# Patient Record
Sex: Female | Born: 1970 | Race: White | Hispanic: No | Smoking: Former smoker
Health system: Southern US, Community
[De-identification: ages and names within clinical notes are randomized; demographics above are authoritative.]

## PROBLEM LIST (undated history)

## (undated) DIAGNOSIS — Z87442 Personal history of urinary calculi: Secondary | ICD-10-CM

## (undated) DIAGNOSIS — E079 Disorder of thyroid, unspecified: Secondary | ICD-10-CM

## (undated) DIAGNOSIS — E7800 Pure hypercholesterolemia, unspecified: Secondary | ICD-10-CM

## (undated) DIAGNOSIS — I639 Cerebral infarction, unspecified: Secondary | ICD-10-CM

## (undated) DIAGNOSIS — Z8669 Personal history of other diseases of the nervous system and sense organs: Secondary | ICD-10-CM

## (undated) DIAGNOSIS — E039 Hypothyroidism, unspecified: Secondary | ICD-10-CM

## (undated) DIAGNOSIS — Z83719 Family history of colon polyps, unspecified: Secondary | ICD-10-CM

## (undated) DIAGNOSIS — D649 Anemia, unspecified: Secondary | ICD-10-CM

## (undated) DIAGNOSIS — Z9289 Personal history of other medical treatment: Secondary | ICD-10-CM

## (undated) DIAGNOSIS — Z860100 Personal history of colon polyps, unspecified: Secondary | ICD-10-CM

## (undated) DIAGNOSIS — E559 Vitamin D deficiency, unspecified: Secondary | ICD-10-CM

## (undated) DIAGNOSIS — Z8673 Personal history of transient ischemic attack (TIA), and cerebral infarction without residual deficits: Secondary | ICD-10-CM

## (undated) DIAGNOSIS — E119 Type 2 diabetes mellitus without complications: Secondary | ICD-10-CM

## (undated) DIAGNOSIS — G8110 Spastic hemiplegia affecting unspecified side: Secondary | ICD-10-CM

## (undated) DIAGNOSIS — R569 Unspecified convulsions: Secondary | ICD-10-CM

## (undated) DIAGNOSIS — Z8371 Family history of colonic polyps: Secondary | ICD-10-CM

## (undated) DIAGNOSIS — D0512 Intraductal carcinoma in situ of left breast: Secondary | ICD-10-CM

## (undated) DIAGNOSIS — F32A Depression, unspecified: Secondary | ICD-10-CM

## (undated) DIAGNOSIS — R4701 Aphasia: Secondary | ICD-10-CM

## (undated) DIAGNOSIS — R011 Cardiac murmur, unspecified: Secondary | ICD-10-CM

## (undated) DIAGNOSIS — L720 Epidermal cyst: Secondary | ICD-10-CM

## (undated) DIAGNOSIS — C801 Malignant (primary) neoplasm, unspecified: Secondary | ICD-10-CM

## (undated) DIAGNOSIS — Z923 Personal history of irradiation: Secondary | ICD-10-CM

## (undated) HISTORY — DX: Personal history of colonic polyps: Z86.010

## (undated) HISTORY — DX: Cardiac murmur, unspecified: R01.1

## (undated) HISTORY — DX: Personal history of transient ischemic attack (TIA), and cerebral infarction without residual deficits: Z86.73

## (undated) HISTORY — DX: Family history of colonic polyps: Z83.71

## (undated) HISTORY — DX: Personal history of other diseases of the nervous system and sense organs: Z86.69

## (undated) HISTORY — DX: Family history of colon polyps, unspecified: Z83.719

## (undated) HISTORY — DX: Disorder of thyroid, unspecified: E07.9

## (undated) HISTORY — DX: Personal history of colon polyps, unspecified: Z86.0100

## (undated) HISTORY — DX: Cerebral infarction, unspecified: I63.9

## (undated) HISTORY — PX: TONSILLECTOMY: SUR1361

## (undated) HISTORY — DX: Personal history of other medical treatment: Z92.89

## (undated) HISTORY — PX: BRAIN SURGERY: SHX531

---

## 2007-09-21 ENCOUNTER — Ambulatory Visit: Payer: Self-pay | Admitting: Obstetrics and Gynecology

## 2010-02-08 DIAGNOSIS — I639 Cerebral infarction, unspecified: Secondary | ICD-10-CM

## 2010-02-08 DIAGNOSIS — Z8673 Personal history of transient ischemic attack (TIA), and cerebral infarction without residual deficits: Secondary | ICD-10-CM

## 2010-02-08 HISTORY — DX: Cerebral infarction, unspecified: I63.9

## 2010-02-08 HISTORY — DX: Personal history of transient ischemic attack (TIA), and cerebral infarction without residual deficits: Z86.73

## 2010-11-18 DIAGNOSIS — I69959 Hemiplegia and hemiparesis following unspecified cerebrovascular disease affecting unspecified side: Secondary | ICD-10-CM | POA: Insufficient documentation

## 2010-12-15 ENCOUNTER — Encounter: Payer: Self-pay | Admitting: Rehabilitation

## 2011-01-09 ENCOUNTER — Encounter: Payer: Self-pay | Admitting: Rehabilitation

## 2011-02-09 ENCOUNTER — Encounter: Payer: Self-pay | Admitting: Rehabilitation

## 2011-03-12 ENCOUNTER — Encounter: Payer: Self-pay | Admitting: Rehabilitation

## 2011-04-09 ENCOUNTER — Encounter: Payer: Self-pay | Admitting: Rehabilitation

## 2011-05-10 ENCOUNTER — Encounter: Payer: Self-pay | Admitting: Rehabilitation

## 2011-05-13 DIAGNOSIS — G8110 Spastic hemiplegia affecting unspecified side: Secondary | ICD-10-CM | POA: Insufficient documentation

## 2011-06-09 ENCOUNTER — Encounter: Payer: Self-pay | Admitting: Rehabilitation

## 2011-07-10 ENCOUNTER — Encounter: Payer: Self-pay | Admitting: Rehabilitation

## 2011-08-09 ENCOUNTER — Encounter: Payer: Self-pay | Admitting: Rehabilitation

## 2011-09-02 ENCOUNTER — Ambulatory Visit: Payer: Self-pay | Admitting: Obstetrics and Gynecology

## 2011-09-02 DIAGNOSIS — I6789 Other cerebrovascular disease: Secondary | ICD-10-CM

## 2011-09-02 LAB — HCG, QUANTITATIVE, PREGNANCY: Beta Hcg, Quant.: <1 m[IU]/mL — ABNORMAL LOW

## 2011-09-02 LAB — CBC WITH DIFFERENTIAL/PLATELET
Basophil #: 0 10*3/uL (ref 0.0–0.1)
Basophil %: 0.6 %
Eosinophil #: 0.2 10*3/uL (ref 0.0–0.7)
HGB: 12.7 g/dL (ref 12.0–16.0)
Lymphocyte #: 3.1 10*3/uL (ref 1.0–3.6)
Lymphocyte %: 38 %
MCH: 29.4 pg (ref 26.0–34.0)
MCHC: 32.7 g/dL (ref 32.0–36.0)
Monocyte #: 0.6 x10 3/mm (ref 0.2–0.9)
Monocyte %: 7.4 %
Neutrophil %: 51.1 %
RDW: 14.3 % (ref 11.5–14.5)

## 2011-09-09 ENCOUNTER — Encounter: Payer: Self-pay | Admitting: Rehabilitation

## 2011-09-10 ENCOUNTER — Ambulatory Visit: Payer: Self-pay | Admitting: Obstetrics and Gynecology

## 2011-09-10 LAB — PREGNANCY, URINE: Pregnancy Test, Urine: NEGATIVE m[IU]/mL

## 2011-09-30 ENCOUNTER — Emergency Department: Payer: Self-pay | Admitting: Emergency Medicine

## 2011-09-30 LAB — COMPREHENSIVE METABOLIC PANEL
Albumin: 3.9 g/dL (ref 3.4–5.0)
Anion Gap: 16 (ref 7–16)
BUN: 13 mg/dL (ref 7–18)
Bilirubin,Total: 0.2 mg/dL (ref 0.2–1.0)
Chloride: 106 mmol/L (ref 98–107)
Creatinine: 0.84 mg/dL (ref 0.60–1.30)
EGFR (African American): >60
Glucose: 118 mg/dL — ABNORMAL HIGH (ref 65–99)
Osmolality: 279 (ref 275–301)
Potassium: 3.4 mmol/L — ABNORMAL LOW (ref 3.5–5.1)
Sodium: 139 mmol/L (ref 136–145)
Total Protein: 7.7 g/dL (ref 6.4–8.2)

## 2011-09-30 LAB — CBC WITH DIFFERENTIAL/PLATELET
Basophil #: 0.1 10*3/uL (ref 0.0–0.1)
Basophil %: 0.7 %
Eosinophil %: 1.8 %
HCT: 42.4 % (ref 35.0–47.0)
HGB: 13.6 g/dL (ref 12.0–16.0)
Lymphocyte #: 5.7 10*3/uL — ABNORMAL HIGH (ref 1.0–3.6)
MCH: 29.2 pg (ref 26.0–34.0)
MCV: 91 fL (ref 80–100)
Monocyte #: 1 x10 3/mm — ABNORMAL HIGH (ref 0.2–0.9)
Monocyte %: 7.6 %
Neutrophil #: 5.8 10*3/uL (ref 1.4–6.5)
Platelet: 318 10*3/uL (ref 150–440)
RBC: 4.66 10*6/uL (ref 3.80–5.20)
WBC: 12.8 10*3/uL — ABNORMAL HIGH (ref 3.6–11.0)

## 2011-09-30 LAB — URINALYSIS, COMPLETE
Bilirubin,UR: NEGATIVE
Glucose,UR: NEGATIVE mg/dL (ref 0–75)
Ketone: NEGATIVE
Specific Gravity: 1.025 (ref 1.003–1.030)
Squamous Epithelial: 2
WBC UR: 16 /HPF (ref 0–5)

## 2011-10-08 ENCOUNTER — Ambulatory Visit: Payer: Self-pay | Admitting: Neurology

## 2011-10-10 ENCOUNTER — Encounter: Payer: Self-pay | Admitting: Rehabilitation

## 2011-11-09 ENCOUNTER — Encounter: Payer: Self-pay | Admitting: Rehabilitation

## 2011-12-10 ENCOUNTER — Encounter: Payer: Self-pay | Admitting: Rehabilitation

## 2012-01-09 ENCOUNTER — Encounter: Payer: Self-pay | Admitting: Rehabilitation

## 2012-02-09 ENCOUNTER — Encounter: Payer: Self-pay | Admitting: Rehabilitation

## 2012-03-11 ENCOUNTER — Encounter: Payer: Self-pay | Admitting: Rehabilitation

## 2012-03-29 ENCOUNTER — Ambulatory Visit: Payer: Self-pay | Admitting: Rehabilitation

## 2012-04-08 ENCOUNTER — Encounter: Payer: Self-pay | Admitting: Rehabilitation

## 2013-02-23 ENCOUNTER — Ambulatory Visit: Payer: Self-pay | Admitting: Internal Medicine

## 2013-03-05 ENCOUNTER — Ambulatory Visit: Payer: Self-pay | Admitting: Internal Medicine

## 2013-11-30 ENCOUNTER — Observation Stay: Payer: Self-pay | Admitting: Family Medicine

## 2013-11-30 DIAGNOSIS — R079 Chest pain, unspecified: Secondary | ICD-10-CM

## 2013-11-30 LAB — COMPREHENSIVE METABOLIC PANEL
ALBUMIN: 3.6 g/dL (ref 3.4–5.0)
AST: 12 U/L — AB (ref 15–37)
Alkaline Phosphatase: 74 U/L
Anion Gap: 7 (ref 7–16)
BUN: 9 mg/dL (ref 7–18)
Bilirubin,Total: 0.4 mg/dL (ref 0.2–1.0)
CALCIUM: 8.6 mg/dL (ref 8.5–10.1)
CREATININE: 0.78 mg/dL (ref 0.60–1.30)
Chloride: 103 mmol/L (ref 98–107)
Co2: 27 mmol/L (ref 21–32)
EGFR (African American): >60
EGFR (Non-African Amer.): >60
Glucose: 108 mg/dL — ABNORMAL HIGH (ref 65–99)
Osmolality: 273 (ref 275–301)
Potassium: 3.7 mmol/L (ref 3.5–5.1)
SGPT (ALT): 26 U/L
Sodium: 137 mmol/L (ref 136–145)
Total Protein: 6.8 g/dL (ref 6.4–8.2)

## 2013-11-30 LAB — CBC
HCT: 43.4 % (ref 35.0–47.0)
HGB: 14.2 g/dL (ref 12.0–16.0)
MCH: 29.6 pg (ref 26.0–34.0)
MCHC: 32.7 g/dL (ref 32.0–36.0)
MCV: 90 fL (ref 80–100)
PLATELETS: 251 10*3/uL (ref 150–440)
RBC: 4.8 10*6/uL (ref 3.80–5.20)
RDW: 12.7 % (ref 11.5–14.5)
WBC: 10.4 10*3/uL (ref 3.6–11.0)

## 2013-11-30 LAB — TROPONIN I
Troponin-I: <0.02 ng/mL
Troponin-I: <0.02 ng/mL
Troponin-I: <0.02 ng/mL

## 2013-11-30 LAB — CK TOTAL AND CKMB (NOT AT ARMC)
CK, TOTAL: 95 U/L
CK, TOTAL: 88 U/L
CK, Total: 98 U/L
CK-MB: 12.6 ng/mL — ABNORMAL HIGH (ref 0.5–3.6)
CK-MB: 14.9 ng/mL — ABNORMAL HIGH (ref 0.5–3.6)
CK-MB: 12.4 ng/mL — ABNORMAL HIGH (ref 0.5–3.6)

## 2013-11-30 LAB — PROTIME-INR
INR: 1.1
Prothrombin Time: 13.8 secs (ref 11.5–14.7)

## 2013-11-30 LAB — PRO B NATRIURETIC PEPTIDE: B-Type Natriuretic Peptide: 29 pg/mL (ref 0–125)

## 2013-11-30 LAB — APTT: Activated PTT: 31.6 secs (ref 23.6–35.9)

## 2013-12-01 LAB — LIPID PANEL
Cholesterol: 152 mg/dL (ref 0–200)
HDL Cholesterol: 31 mg/dL — ABNORMAL LOW (ref 40–60)
Ldl Cholesterol, Calc: 94 mg/dL (ref 0–100)
TRIGLYCERIDES: 133 mg/dL (ref 0–200)
VLDL CHOLESTEROL, CALC: 27 mg/dL (ref 5–40)

## 2013-12-01 LAB — BASIC METABOLIC PANEL
Anion Gap: 9 (ref 7–16)
BUN: 8 mg/dL (ref 7–18)
CO2: 27 mmol/L (ref 21–32)
Calcium, Total: 8.4 mg/dL — ABNORMAL LOW (ref 8.5–10.1)
Chloride: 105 mmol/L (ref 98–107)
Creatinine: 0.81 mg/dL (ref 0.60–1.30)
EGFR (African American): >60
EGFR (Non-African Amer.): >60
Glucose: 109 mg/dL — ABNORMAL HIGH (ref 65–99)
Osmolality: 280 (ref 275–301)
Potassium: 3.9 mmol/L (ref 3.5–5.1)
SODIUM: 141 mmol/L (ref 136–145)

## 2013-12-01 LAB — CBC WITH DIFFERENTIAL/PLATELET
BASOS ABS: 0.1 10*3/uL (ref 0.0–0.1)
Basophil %: 0.5 %
EOS PCT: 1.6 %
Eosinophil #: 0.2 10*3/uL (ref 0.0–0.7)
HCT: 41.7 % (ref 35.0–47.0)
HGB: 13.7 g/dL (ref 12.0–16.0)
Lymphocyte #: 2.8 10*3/uL (ref 1.0–3.6)
Lymphocyte %: 26.9 %
MCH: 29.8 pg (ref 26.0–34.0)
MCHC: 32.9 g/dL (ref 32.0–36.0)
MCV: 91 fL (ref 80–100)
MONO ABS: 0.8 x10 3/mm (ref 0.2–0.9)
MONOS PCT: 7.3 %
NEUTROS ABS: 6.7 10*3/uL — AB (ref 1.4–6.5)
Neutrophil %: 63.7 %
Platelet: 242 10*3/uL (ref 150–440)
RBC: 4.6 10*6/uL (ref 3.80–5.20)
RDW: 12.9 % (ref 11.5–14.5)
WBC: 10.4 10*3/uL (ref 3.6–11.0)

## 2013-12-01 LAB — HEMOGLOBIN A1C: Hemoglobin A1C: 5.9 % (ref 4.2–6.3)

## 2013-12-01 LAB — TSH: THYROID STIMULATING HORM: 6.04 u[IU]/mL — AB

## 2013-12-05 ENCOUNTER — Encounter: Payer: Self-pay | Admitting: *Deleted

## 2013-12-05 ENCOUNTER — Telehealth: Payer: Self-pay

## 2013-12-05 NOTE — Telephone Encounter (Signed)
Attempted to contact pt regarding her discharge from [facility] on 12/02/13. Advised her of appt w/ Christell Faith, PA on 12/13/13 @ 1:15. Asked her to call back w/ any questions about her discharge instructions or medications.

## 2013-12-10 ENCOUNTER — Encounter: Payer: Self-pay | Admitting: Physician Assistant

## 2013-12-12 DIAGNOSIS — F329 Major depressive disorder, single episode, unspecified: Secondary | ICD-10-CM | POA: Insufficient documentation

## 2013-12-12 DIAGNOSIS — E559 Vitamin D deficiency, unspecified: Secondary | ICD-10-CM | POA: Insufficient documentation

## 2013-12-12 DIAGNOSIS — F32A Depression, unspecified: Secondary | ICD-10-CM | POA: Insufficient documentation

## 2013-12-13 ENCOUNTER — Ambulatory Visit (INDEPENDENT_AMBULATORY_CARE_PROVIDER_SITE_OTHER): Payer: Commercial Managed Care - HMO | Admitting: Physician Assistant

## 2013-12-13 ENCOUNTER — Encounter: Payer: Self-pay | Admitting: Physician Assistant

## 2013-12-13 VITALS — BP 130/80 | HR 89 | Ht 62.0 in | Wt 210.0 lb

## 2013-12-13 DIAGNOSIS — M6289 Other specified disorders of muscle: Secondary | ICD-10-CM

## 2013-12-13 DIAGNOSIS — R079 Chest pain, unspecified: Secondary | ICD-10-CM

## 2013-12-13 DIAGNOSIS — I639 Cerebral infarction, unspecified: Secondary | ICD-10-CM

## 2013-12-13 DIAGNOSIS — Z87891 Personal history of nicotine dependence: Secondary | ICD-10-CM

## 2013-12-13 DIAGNOSIS — R531 Weakness: Secondary | ICD-10-CM

## 2013-12-13 DIAGNOSIS — Z8669 Personal history of other diseases of the nervous system and sense organs: Secondary | ICD-10-CM

## 2013-12-13 MED ORDER — ATORVASTATIN CALCIUM 40 MG PO TABS: 40.0000 mg | ORAL_TABLET | Freq: Every day | ORAL | Status: DC

## 2013-12-13 NOTE — Patient Instructions (Signed)
Your physician has recommended that you wear a holter monitor. Holter monitors are medical devices that record the heart's electrical activity. Doctors most often use these monitors to diagnose arrhythmias. Arrhythmias are problems with the speed or rhythm of the heartbeat. The monitor is a small, portable device. You can wear one while you do your normal daily activities. This is usually used to diagnose what is causing palpitations/syncope (passing out).   Your physician has recommended you make the following change in your medication:  Start Atorvastatin 40 mg once daily   Your physician recommends that you schedule a follow-up appointment in:  6 weeks with Christell Faith PA   Your next appointment will be scheduled in our new office located at :  [facility]  [address] [place]  [place],  [address]

## 2013-12-13 NOTE — Progress Notes (Signed)
Patient Name: Melissa Day, Melissa Day [birth year], MRN [id number]  Date of Encounter: 12/13/2013  Primary Care Provider:  Tracie Harrier, MD Primary Cardiologist:  Dr. Rockey Situ, MD  Patient Profile:  43 y.o. female with history below presents for hospital follow up after recent admission to [facility] from 10/23-10/25 for chest pain of uncertain etiology. She underwent a Lexiscan Myoview that did not show any significant ischemia or significant wall motion abnormalities, EF 67% and an echo that showed an EF 50-55%, mild MR, o/w normal.       Problem List:   Past Medical History  Diagnosis Date  . H/O: CVA (cerebrovascular accident) 2012    a. cerebral edema, craniotomy, residual right upper & lower limb weakness  . Hx of seizure disorder     a. one episode 10 months after CVA, since then on Keppra   . History of echocardiogram     a. 12/01/2013: EF 50-55%, nl global LV systolic function, mild MR, mildly increased LV posterior wall thickness  . History of stress test     a. 12/01/2013: no significant ischemia, no significant WMA, no EKG changes concerning for ischemia, EF 67%, low risk scan  . Heart murmur   . Stroke   . Thyroid disease    Past Surgical History  Procedure Laterality Date  . Brain surgery       Allergies:  No Known Allergies   HPI:  43 y.o. female with the above problem list presents for hospital follow up.   Patient with history of CVA in 2012, cerebral edema, craniotomy, right sided weakness that was treated at [facility]. Per her report she was evaluated without etiology determined. She has remained on aspirin 81 mg since. She does not recall ever wearing a cardiac monitor as part of her evaluation. She was not on OCP at time (had quit taking them in 2009). She did continue to smoke and ultimately quit at the time of her CVA. She has a family history of CVA in her father. No prior known cardiac history.    She presented to [facility] on 12/02/2013 after waking up with  severe left sided chest pain that radiated to her left arm. The day prior she had been using her left arm to pull herself up the stairs with a pulley-like system. Her TnI were negative x 3. She underwent a Lexiscan Myoview that was Low risk, no significant ischemia, no significant wall motion abnormalities, EF 67%. She also underwent an echo that showed an EF of 50-55%, mild MR and was otherwise normal. She remained chest pain free during her admission and has been chest pain free since. She is not extremely active at baseline, but does do some activities.      Home Medications:  Prior to Admission medications   Medication Sig Start Date End Date Taking? Authorizing Provider  aspirin 81 MG tablet Take 81 mg by mouth daily.    Historical Provider, MD  ferrous sulfate 325 (65 FE) MG tablet Take 325 mg by mouth daily with breakfast.    Historical Provider, MD  FLUoxetine (PROZAC) 40 MG capsule Take 40 mg by mouth daily.    Historical Provider, MD  levETIRAcetam (KEPPRA) 750 MG tablet Take 750 mg by mouth 2 (two) times daily.    Historical Provider, MD  levothyroxine (SYNTHROID, LEVOTHROID) 100 MCG tablet Take 100 mcg by mouth daily before breakfast.    Historical Provider, MD  vitamin C (ASCORBIC ACID) 500 MG tablet Take 500 mg by mouth daily.  Historical Provider, MD     Weights: Filed Weights   12/13/13 1318  Weight: 210 lb (95.255 kg)     Review of Systems:  All other systems reviewed and are otherwise negative except as noted above.  Physical Exam:  Blood pressure 130/80, pulse 89, height 5\' 2"  (1.575 m), weight 210 lb (95.255 kg).  General: Pleasant, NAD.  Psych: Normal affect. Neuro: Alert and oriented X 3. Moves all extremities spontaneously. HEENT: Normal.   Neck: Supple without bruits or JVD. Lungs:  Resp regular and unlabored, CTA. Heart: RRR no s3, s4, or murmurs. Abdomen: Soft, non-tender, non-distended, BS + x 4.  Extremities: No clubbing, cyanosis or edema.  DP/PT/Radials 2+ and equal bilaterally. Right sided weakness.    Accessory Clinical Findings:  EKG - NSR, 89, no st/t changes from previous study 2013  Assessment & Plan:  1. History of chest pain: -Nuclear study 11/2013 without significant ischemia or significant wall motion abnormalities. No EKG changes concerning for ischemia. EF 67%.  -Echo 11/2013 EF 50-55%, mild MR, o/w normal. -No further chest pain since her admission to the hospital or since her discharge from the hospital  -She was pulling herself up using a pulley-type system on the stairs the day prior to her symptoms, there is question of possible musculoskeletal strain.  -Should she redevelop any symptoms she is notify us/go to the ER for further evaluation   2. History of CVA 2012 with residual right sided weakness: -Per patient work up was completed at [facility] with undetermined etiology  -Echo on 11/2013 did not reveal obvious patent foramen ovale  -Per patient report and per her mother's report they do not recall ever wearing a cardiac monitor to r/o cardiac arrhythmia as an etiology -Schedule 30 day event monitor  -Add Lipitor 40 mg  -Continue aspirin 81 mg for now unless event monitor dictates otherwise  -No recent symptoms  3. Residual right sided arm and leg weakness: -Planning to move from top floor to lower floor for easier living  4. History of tobacco abuse: -Quit at the time of her CVA  5. History of seizure: -On Keppra -Followed by neurology    Christell Faith, PA-C [place], [place] [address] [phone] [place] 12/13/2013, 2:10 PM

## 2013-12-15 ENCOUNTER — Telehealth: Payer: Self-pay | Admitting: *Deleted

## 2013-12-15 NOTE — Telephone Encounter (Signed)
Enrolled in ecardio for 30 day holter

## 2013-12-20 ENCOUNTER — Encounter: Payer: Self-pay | Admitting: Physician Assistant

## 2013-12-20 DIAGNOSIS — R011 Cardiac murmur, unspecified: Secondary | ICD-10-CM | POA: Insufficient documentation

## 2013-12-20 DIAGNOSIS — Z8673 Personal history of transient ischemic attack (TIA), and cerebral infarction without residual deficits: Secondary | ICD-10-CM | POA: Insufficient documentation

## 2013-12-20 DIAGNOSIS — Z8669 Personal history of other diseases of the nervous system and sense organs: Secondary | ICD-10-CM | POA: Insufficient documentation

## 2013-12-20 DIAGNOSIS — Z9289 Personal history of other medical treatment: Secondary | ICD-10-CM | POA: Insufficient documentation

## 2013-12-20 DIAGNOSIS — E079 Disorder of thyroid, unspecified: Secondary | ICD-10-CM | POA: Insufficient documentation

## 2014-01-10 ENCOUNTER — Telehealth: Payer: Self-pay | Admitting: Physician Assistant

## 2014-01-10 NOTE — Telephone Encounter (Signed)
lmov to call back and make an apt with R.Dunn for sometime in December. Did not have schedule out when she last saw him.

## 2014-01-14 ENCOUNTER — Telehealth: Payer: Self-pay | Admitting: Physician Assistant

## 2014-01-14 NOTE — Telephone Encounter (Signed)
Pt mother calling asking if the results of the monitor will be in by the time she comes in for their apt

## 2014-01-15 NOTE — Telephone Encounter (Signed)
Informed patients mother that results should be available by the time she has her appt  Monitor is scheduled to be turned in 12/11 Patients mother verbalized understanding

## 2014-01-23 NOTE — Progress Notes (Signed)
Patient Name: Melissa Day, Melissa Day 1970/10/09, MRN [id number]  Date of Encounter: 01/24/2014  Primary Care Provider:  Tracie Harrier, MD Primary Cardiologist:  Dr. Rockey Situ, MD  Patient Profile:  43 y.o. female with history below presents for routine follow up of discussion of cardiac monitoring.    Problem List:   Past Medical History  Diagnosis Date  . H/O: CVA (cerebrovascular accident) 2012    a. cerebral edema, craniotomy, residual right upper & lower limb weakness  . Hx of seizure disorder     a. one episode 10 months after CVA, since then on Keppra   . History of echocardiogram     a. 12/01/2013: EF 50-55%, nl global LV systolic function, mild MR, mildly increased LV posterior wall thickness  . History of stress test     a. 12/01/2013: no significant ischemia, no significant WMA, no EKG changes concerning for ischemia, EF 67%, low risk scan  . Heart murmur   . Thyroid disease    Past Surgical History  Procedure Laterality Date  . Brain surgery       Allergies:  No Known Allergies   HPI:  43 y.o. female with the above problem list. She has a history of a stroke in 2012 --> cerebral edema, craniotomy, right sided weakness that was treated at [facility]. Per her report she was evaluated without etiology determined. She has remained on aspirin 81 mg since. She does not recall ever wearing a cardiac monitor as part of her evaluation. She was not on OCP at time (had quit taking them in 2009). She did continue to smoke and ultimately quit at the time of her CVA. She has a family history of CVA in her father. No prior known cardiac history.   Recent hospitalization at [facility] 11/2013 with left sided chest pain that radiated to her left arm. She had recently used her arm to pull herself up some stairs with a pulley-like system. Troponin were negative. She underwent Lexiscan Myoview that was Low risk, no significant ischemia, no significant wall motion abnormalities, EF 67%. Echo  showed EF of 50-55%, mild MR and was otherwise normal. Chest pain was felt to be atypical in etiology.   Cardiac event monitoring was reviewed today as part of her prior (2012) stroke work up which reveals NSR. She continues not have any recent symptoms. She lives a fairly sedentary lifestyle not exercising. No further chest pain. Tolerating Lipitor without issues. No seizures.       Home Medications:  Prior to Admission medications   Medication Sig Start Date End Date Taking? Authorizing Provider  aspirin 81 MG tablet Take 81 mg by mouth daily.    Historical Provider, MD  atorvastatin (LIPITOR) 40 MG tablet Take 1 tablet (40 mg total) by mouth daily. 12/13/13   Rise Mu, PA-C  ferrous sulfate 325 (65 FE) MG tablet Take 325 mg by mouth daily with breakfast.    Historical Provider, MD  FLUoxetine (PROZAC) 40 MG capsule Take 40 mg by mouth daily.    Historical Provider, MD  levETIRAcetam (KEPPRA) 750 MG tablet Take 750 mg by mouth 2 (two) times daily.    Historical Provider, MD  levothyroxine (SYNTHROID, LEVOTHROID) 100 MCG tablet Take 100 mcg by mouth daily before breakfast.    Historical Provider, MD  vitamin C (ASCORBIC ACID) 500 MG tablet Take 500 mg by mouth daily.    Historical Provider, MD     Weights: Filed Weights   01/24/14 1339  Weight: 214  lb 8 oz (97.297 kg)     Review of Systems:  As above.  All other systems reviewed and are otherwise negative except as noted above.  Physical Exam:  Blood pressure 128/89, pulse 80, height 5\' 3"  (1.6 m), weight 214 lb 8 oz (97.297 kg).  General: Pleasant, NAD Psych: Normal affect. Neuro: Alert and oriented X 3. Moves all extremities spontaneously. HEENT: Normal  Neck: Supple without bruits or JVD. Lungs:  Resp regular and unlabored, CTA. Heart: RRR no s3, s4, or murmurs. Abdomen: Soft, non-tender, non-distended, BS + x 4.  Extremities: No clubbing, cyanosis or edema.    Accessory Clinical Findings:  Cardiac event monitor:  NSR  Assessment & Plan:  1. History of CVA 2012 with residual right sided weakness: -30 day cardiac monitor with NSR, no arrhythmias  -Echo 11/2013 did not reveal obvious patent foramen ovale  -Continue Lipitor 40 mg, check HFP - FLP to be drawn by PCP -Continue aspirin 81 mg  -No recent symptoms -Could consider low dose antihypertensive at follow up, patient and patient's mother wish to wait on this to see how her BP is at follow up after starting walking program we discussed  2. History of atypical chest pain: -No further symptoms -Negative nuclear stress test 11/2013 -No further ischemic evaluation at this time  3. Obesity: -Body mass index is 38.01 kg/(m^2). -Start walking program  4. History of tobacco abuse  5. History of seizure: -On Keppra -Followed by neurology    Christell Faith, PA-C [place], [place] [address] [phone] [place] 01/24/2014, 2:52 PM

## 2014-01-24 ENCOUNTER — Ambulatory Visit (INDEPENDENT_AMBULATORY_CARE_PROVIDER_SITE_OTHER): Payer: Commercial Managed Care - HMO | Admitting: Physician Assistant

## 2014-01-24 ENCOUNTER — Encounter: Payer: Self-pay | Admitting: Physician Assistant

## 2014-01-24 VITALS — BP 128/89 | HR 80 | Ht 63.0 in | Wt 214.5 lb

## 2014-01-24 DIAGNOSIS — Z87891 Personal history of nicotine dependence: Secondary | ICD-10-CM

## 2014-01-24 DIAGNOSIS — R0789 Other chest pain: Secondary | ICD-10-CM

## 2014-01-24 DIAGNOSIS — E669 Obesity, unspecified: Secondary | ICD-10-CM

## 2014-01-24 DIAGNOSIS — Z8673 Personal history of transient ischemic attack (TIA), and cerebral infarction without residual deficits: Secondary | ICD-10-CM

## 2014-01-24 DIAGNOSIS — Z8669 Personal history of other diseases of the nervous system and sense organs: Secondary | ICD-10-CM

## 2014-01-24 DIAGNOSIS — Z87898 Personal history of other specified conditions: Secondary | ICD-10-CM

## 2014-01-24 NOTE — Patient Instructions (Addendum)
Hepatic Function Panel today. Your physician recommends that you schedule a follow-up appointment in: April 2016 with Christell Faith, PA.  Continue with your current medications.

## 2014-01-25 LAB — HEPATIC FUNCTION PANEL
ALT: 18 IU/L (ref 0–32)
AST: 16 IU/L (ref 0–40)
Albumin: 4.3 g/dL (ref 3.5–5.5)
Alkaline Phosphatase: 94 IU/L (ref 39–117)
BILIRUBIN DIRECT: 0.11 mg/dL (ref 0.00–0.40)
BILIRUBIN TOTAL: 0.3 mg/dL (ref 0.0–1.2)
Total Protein: 6.4 g/dL (ref 6.0–8.5)

## 2014-02-06 ENCOUNTER — Telehealth: Payer: Self-pay | Admitting: *Deleted

## 2014-02-06 NOTE — Telephone Encounter (Signed)
Informed patient her holter showed NSR with no arrhythmia

## 2014-02-11 ENCOUNTER — Other Ambulatory Visit: Payer: Self-pay

## 2014-02-11 ENCOUNTER — Ambulatory Visit (INDEPENDENT_AMBULATORY_CARE_PROVIDER_SITE_OTHER): Payer: Commercial Managed Care - HMO

## 2014-02-11 DIAGNOSIS — I639 Cerebral infarction, unspecified: Secondary | ICD-10-CM

## 2014-02-11 DIAGNOSIS — Z8673 Personal history of transient ischemic attack (TIA), and cerebral infarction without residual deficits: Secondary | ICD-10-CM

## 2014-03-05 ENCOUNTER — Ambulatory Visit: Payer: Self-pay | Admitting: Internal Medicine

## 2014-05-02 ENCOUNTER — Ambulatory Visit: Payer: Self-pay | Admitting: Physician Assistant

## 2014-05-28 NOTE — Op Note (Signed)
PATIENT NAME:  Melissa Day, SINQUEFIELD MR#:  [id number] DATE OF BIRTH:  12/24/1970  DATE OF PROCEDURE:  09/10/2011  PREOPERATIVE DIAGNOSIS: Menorrhagia.   POSTOPERATIVE DIAGNOSIS: Menorrhagia.   PROCEDURE: NovaSure endometrial ablation.   SURGEON:  Lyndal Rainbow, MD  ANESTHESIA: General.   ESTIMATED BLOOD LOSS: Zero.   COMPLICATIONS: None.   SPECIMEN REMOVED: None.   FINDINGS: Normal cavity. The NovaSure ablation was completed at a power of 77 watts over a time of two minutes. The sounding length was 8 cm, cervical length was 4 cm, cavity length was 4 cm. Cavity width was 3.5 cm. There were no complications. The patient tolerated the procedure without difficulty.   DESCRIPTION OF PROCEDURE: The patient was taken to the operating room where she was prepped and draped in the usual sterile fashion in the dorsal supine lithotomy position using padded boot stirrups. The cervix was stabilized using a single-tooth tenaculum and the sounding length of the uterus was obtained, followed by the cervical length. The uterus was serially dilated to a #8 [place] and the NovaSure endometrial ablation instrument was placed into the endometrial cavity and deployed. The cavity width was noted to be 3.5 cm. CO2 assessment was normal with intact cavity and no leakage. The machine was enabled and the ablation was completed in two minutes with a power of 77 watts without complication. At the end of procedure the NovaSure endometrial ablation instrument was removed from the uterine cavity. The single-tooth tenaculum was removed from the cervix. Monsel solution was applied to the cervical site to ensure hemostasis. There was no bleeding. The speculum was removed. The patient was returned to the dorsal supine position. She was awakened from anesthesia without difficulty and returned to the recovery area in stable condition for monitoring. Discharge is to home with office followup in two weeks.       ____________________________ Shelby Mattocks Georga Bora, MD [id number] D: 09/10/2011 14:32:51 ET T: 09/10/2011 14:48:39 ET JOB#: [id number]  cc: Shelby Mattocks. Georga Bora, MD, <Dictator> Tracie Harrier, MD Hulan Fray Georga Bora MD ELECTRONICALLY SIGNED 10/24/2011 21:50

## 2014-06-01 NOTE — H&P (Signed)
PATIENT NAME:  Melissa Day, Melissa Day MR#:  [id number] DATE OF BIRTH:  1970-07-29  DATE OF ADMISSION:  11/30/2013  PRIMARY CARE PHYSICIAN: Tracie Harrier, MD  REFERRING EMERGENCY ROOM PHYSICIAN: Dr. Jimmye Norman   CHIEF COMPLAINT: Chest pain.   HISTORY OF PRESENT ILLNESS: A 44 year old female who has a past history of a massive stroke 3 years ago. She had cerebral edema after that and had to go for craniotomy. After a few months, they closed it. As a result of stroke, she went for rehab and finally now left with right upper limb and lower limb weakness, but she is able to manage her day-to-day activities and walks with a some support. She is independent in her daily life. She also developed some aphasia that she cannot speak long sentences, but can answer in yes or no and a few words and understand everything. This morning around 6, she woke up with severe chest pain that was going to her left arm. So, she spoke to her sister, who is a Marine scientist. She told her to take aspirin and rest. After lying down for a few minutes, the pain went away. Her sister advised her to go to the Emergency Room, but she refused. The pain started coming back again, on and off; so, finally, she came to the Emergency Room. History obtained from the patient's mother and father, who are also present in the room. On further questioning, she denies any shortness of breath, nausea, or cough.   REVIEW OF SYSTEMS:  CONSTITUTIONAL: Negative for fever, fatigue, weakness, pain or weight loss.  EYES: No blurring, double vision, discharge or redness.  EARS, NOSE, THROAT: No tinnitus, ear pain or hearing loss.  RESPIRATORY: No cough, wheezing, shortness of breath.  CARDIOVASCULAR: The patient has chest pain, no palpitations, edema, arrhythmia.  GASTROINTESTINAL: No nausea, vomiting, diarrhea, abdominal pain.  GENITOURINARY: No dysuria, hematuria, or increased frequency.  ENDOCRINE: No heat or cold intolerance.  SKIN: No acne, rashes or  lesions. MUSCULOSKELETAL: No pain or swelling in the joints.  NEUROLOGIC: No numbness, new weakness, tremor or vertigo.   PSYCHIATRIC: No anxiety, insomnia, bipolar disorder.   PAST MEDICAL HISTORY:  1.  As mentioned above, massive stroke 3 years ago, which left her with right-sided mild weakness in the upper and lower extremities and aphasia.  2.  Seizure episode one time after 10 months of stroke, and since then she is on Keppra.  3.  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 Can speak a few words to small sentence but understands everything.   PAST SURGICAL HISTORY:  Craniotomy surgery because of severe cerebral edema after stroke, and repair done after a few months.   SOCIAL HISTORY: She was a smoker before stroke but stopped after that. No drinking, alcohol, or illegal drug use and independent in her day-to-day activities.   FAMILY HISTORY: Father had coronary artery disease and stent placement.   HOME MEDICATIONS:  1.  Vitamin C 500 mg oral once a day.  2. Levothyroxine 100 mcg oral once a day.  3. Keppra 750 mg oral tablet 2 times a day.  4. Fluoxetine 40 mg oral once a day. 5. Ferrous sulfate 325 mg oral once a day.  6. Aspirin 81 mg once a day.   PHYSICAL EXAMINATION:  VITAL SIGNS: In the ER, temperature 98.1, pulse 74, respirations 20, blood pressure 122/81, and pulse oximetry 97% on room air.  GENERAL: The patient is fully alert and oriented to time, place, and person. Has some aphasia, but cooperative with history and  physical examination.  HEAD AND NECK: Atraumatic. Conjunctivae pink. Oral mucosa moist.  Neck is supple. No JVD.  RESPIRATORY: Bilateral equal and clear air entry.  CARDIOVASCULAR: S1, S2 present, regular. No murmur.  ABDOMEN: Soft, nontender. Bowel sounds present. No organomegaly.  SKIN: No acne, rashes or lesions. MUSCULOSKELETAL: No pain or tenderness or swelling in the joints.   NEUROLOGIC: Power is 4/5 in right upper and lower extremities. Some contracture present in  right upper limb. Left side power is 5/5. No tremor or rigidity and follows commands.   PSYCHIATRIC: Does not appear in any acute psychiatric illness at this time.  IMPORTANT LABORATORY RESULTS: Glucose 108, BNP is 29. BUN 9, creatinine 0.78, sodium 137, potassium is 3.7, chloride 103, CO2 27, calcium is 8.6. Total protein is 6.8, bilirubin 0.4, alkaline phosphatase 74. SGOT 12, SGPT is 26. Troponin is less than 0.02 CK-MB is 12.6. WBC is 10.4, hemoglobin 14.2, platelet count is 251,000. MCV 90. INR is 1.1 and prothrombin time 13.8.   Chest x-ray, portable, single view, shows no acute disease.   ASSESSMENT AND PLAN: Melissa Day is a 44 year old female who has history of massive stroke in the past and left over with some aphasia and right-sided weakness, had also seizure once after the stroke. Came to the Emergency Room today with complaint of chest pain, which responded partially to aspirin tablet.  1.  Chest pain, which looks like to be anginal episodes. We will admit to telemetry and follow serial troponins and will do echocardiogram and get cardiology consult with [facility] at patient's request. She is already on aspirin. We will continue that for now. We will check lipid panel and HbA1c to know about all these factors. She was a smoker in the past and had a major stroke, which make her at high risk for having coronary event.  2.  History of seizure. We will continue Keppra as she was taking at home.  3.  Hypothyroidism. She is taking levothyroxine at home. We will check TSH level to check for adequate replacement.  4.  Aphasia and right-sided weakness. This is stable after her stroke 3 years ago and not an active issue. We will just continue monitoring.   CODE STATUS: Full code.   TOTAL TIME SPENT ON THIS ADMISSION: 50 minutes    ____________________________ Ceasar Lund Anselm Jungling, MD [id number] D: 11/30/2013 00:76:22 ET T: 11/30/2013 12:51:30 ET JOB#: [id number]  cc: Ceasar Lund. Anselm Jungling, MD, <Dictator> Tracie Harrier, MD Vaughan Basta MD ELECTRONICALLY SIGNED 12/09/2013 18:25

## 2014-06-01 NOTE — Consult Note (Signed)
General Aspect 44 year old female who has a past history of a massive stroke 3 years ago, cerebral edema, craniotomy, residual  right upper limb and lower limb weakness, presentign with chest pain. Cardiology was consulted for severe chest pain sx.  She is independent in her daily life. She has some aphasia, cannot speak long sentences, but can answer in yes or no and a few words and understand everything.  Family member provides the hx.  This morning around 6, she woke up with severe chest pain radiating to her left arm.  she called her sister, who is a Marine scientist. She told her to take aspirin and rest. After lying down for a few minutes, the pain went away. Her sister advised her to go to the Emergency Room, but she refused. The pain started coming back again, on and off.  she came to the Emergency Room.  she denies any shortness of breath, nausea, or cough.  No other epsiodes of chest pain.    PAST MEDICAL HISTORY:  1.  stroke 3 years ago, which left her with right-sided mild weakness in the upper and lower extremities and aphasia.  2.  Seizure episode one time, 10 months after stroke, and since then she is on Keppra.  3.  Residual aphasia after stroke.   PAST SURGICAL HISTORY:   Craniotomy surgery because of severe cerebral edema after stroke, and repair done after a few months.   SOCIAL HISTORY:  She was a smoker before stroke but stopped after that. No drinking, alcohol, or illegal drug use and independent in her day-to-day activities.   FAMILY HISTORY:  Father had coronary artery disease and stent placement.   Physical Exam:  GEN well developed, well nourished, no acute distress   HEENT hearing intact to voice, moist oral mucosa   NECK supple  No masses   RESP normal resp effort  clear BS   CARD Regular rate and rhythm  No murmur   ABD denies tenderness  normal BS   LYMPH negative neck   EXTR negative edema   SKIN normal to palpation   NEURO motor/sensory function  intact, right arm adn leg weakness   PSYCH alert, A+O to time, place, person   Review of Systems:  Subjective/Chief Complaint chest pain   General: No Complaints   Skin: No Complaints   ENT: No Complaints   Eyes: No Complaints   Neck: No Complaints   Respiratory: No Complaints   Cardiovascular: Chest pain or discomfort   Gastrointestinal: No Complaints   Genitourinary: No Complaints   Vascular: No Complaints   Musculoskeletal: No Complaints   Neurologic: weakness of arm and leg on right, aphasia   Hematologic: No Complaints   Endocrine: No Complaints   Psychiatric: No Complaints   Review of Systems: All other systems were reviewed and found to be negative   Medications/Allergies Reviewed Medications/Allergies reviewed   Family & Social History:  Family and Social History:  Family History Coronary Artery Disease   Social History negative tobacco, positive tobacco (Greater than 1 year)   + Tobacco Prior (greater than 1 year)  no currently smoking   Place of Living Home     Right Sided Weakness: from CVA in 2012   Expressive Aphasia:    Neck Problems:    UTI:    Anemia:    Hypothyroidism:    Seizures: after CVA   Left MCA CVA: Oct 2012   Flap Reattachment: Feb 2013   Tonsillectomy:    Left Temporal  Craniectomy: Oct 2012       Admit Diagnosis:   CHEST PAIN ANGINA AT REST: Onset Date: 30-Nov-2013, Status: Active, Description: CHEST PAIN ANGINA AT REST  Home Medications: Medication Instructions Status  Keppra 750 mg oral tablet 1 tab(s) orally 2 times a day Active  levothyroxine 100 mcg (0.1 mg) oral tablet 1 tab(s) orally once a day Active  fluoxetine 40 mg oral capsule 1 cap(s) orally once a day Active  ferrous sulfate 325 mg (65 mg elemental iron) oral tablet 1 tab(s) orally once a day Active  Vitamin C 500 mg oral tablet 1 tab(s) orally once a day Active  aspirin 81 mg oral tablet 1 tab(s) orally once a day Active   Lab  Results: Hepatic:  23-Oct-15 08:34   Bilirubin, Total 0.4  Alkaline Phosphatase 74 (46-116 NOTE: New Reference Range 08/28/13)  SGPT (ALT) 26 (14-63 NOTE: New Reference Range 08/28/13)  SGOT (AST)  12  Total Protein, Serum 6.8  Albumin, Serum 3.6  Routine Chem:  23-Oct-15 08:34   Glucose, Serum  108  BUN 9  Creatinine (comp) 0.78  Sodium, Serum 137  Potassium, Serum 3.7  Chloride, Serum 103  CO2, Serum 27  Calcium (Total), Serum 8.6  Osmolality (calc) 273  eGFR (African American) >60  eGFR (Non-African American) >60 (eGFR values <48m/min/1.73 m2 may be an indication of chronic kidney disease (CKD). Calculated eGFR, using the MRDR Study equation, is useful in  patients with stable renal function. The eGFR calculation will not be reliable in acutely ill patients when serum creatinine is changing rapidly. It is not useful in patients on dialysis. The eGFR calculation may not be applicable to patients at the low and high extremes of body sizes, pregnant women, and vetetarians.)  Anion Gap 7  B-Type Natriuretic Peptide ([facility]) 29 (Result(s) reported on 30 Nov 2013 at 09:14AM.)  Cardiac:  23-Oct-15 08:34   Troponin I < 0.02 (0.00-0.05 0.05 ng/mL or less: NEGATIVE  Repeat testing in 3-6 hrs  if clinically indicated. >0.05 ng/mL: POTENTIAL  MYOCARDIAL INJURY. Repeat  testing in 3-6 hrs if  clinically indicated. NOTE: An increase or decrease  of 30% or more on serial  testing suggests a  clinically important change)  CK, Total 98 (26-192 NOTE: NEW REFERENCE RANGE  03/12/2013)  CPK-MB, Serum  12.6 (Result(s) reported on 30 Nov 2013 at 09:14AM.)    12:11   Troponin I < 0.02 (0.00-0.05 0.05 ng/mL or less: NEGATIVE  Repeat testing in 3-6 hrs  if clinically indicated. >0.05 ng/mL: POTENTIAL  MYOCARDIAL INJURY. Repeat  testing in 3-6 hrs if  clinically indicated. NOTE: An increase or decrease  of 30% or more on serial  testing suggests a  clinically important  change)  CPK-MB, Serum  14.9 (Result(s) reported on 30 Nov 2013 at 12:58PM.)    16:14   Troponin I < 0.02 (0.00-0.05 0.05 ng/mL or less: NEGATIVE  Repeat testing in 3-6 hrs  if clinically indicated. >0.05 ng/mL: POTENTIAL  MYOCARDIAL INJURY. Repeat  testing in 3-6 hrs if  clinically indicated. NOTE: An increase or decrease  of 30% or more on serial  testing suggests a  clinically important change)  CPK-MB, Serum  12.4 (Result(s) reported on 30 Nov 2013 at 04:48PM.)  Routine Coag:  23-Oct-15 08:34   Prothrombin 13.8  INR 1.1 (INR reference interval applies to patients on anticoagulant therapy. A single INR therapeutic range for coumarins is not optimal for all indications; however, the suggested range for most indications is 2.0 -  3.0. Exceptions to the INR Reference Range may include: Prosthetic heart valves, acute myocardial infarction, prevention of myocardial infarction, and combinations of aspirin and anticoagulant. The need for a higher or lower target INR must be assessed individually. Reference: The Pharmacology and Management of the Vitamin K  antagonists: the seventh ACCP Conference on Antithrombotic and Thrombolytic Therapy. UJWJX.9147 Sept:126 (3suppl): [id number]. A HCT value >55% may artifactually increase the PT.  In one study,  the increase was an average of 25%. Reference:  "Effect on Routine and Special Coagulation Testing Values of Citrate Anticoagulant Adjustment in Patients with High HCT Values." American Journal of Clinical Pathology 2006;126:400-405.)  Activated PTT (APTT) 31.6 (A HCT value >55% may artifactually increase the APTT. In one study, the increase was an average of 19%. Reference: "Effect on Routine and Special Coagulation Testing Values of Citrate Anticoagulant Adjustment in Patients with High HCT Values." American Journal of Clinical Pathology 2006;126:400-405.)  Routine Hem:  23-Oct-15 08:34   WBC (CBC) 10.4  RBC (CBC) 4.80  Hemoglobin  (CBC) 14.2  Hematocrit (CBC) 43.4  Platelet Count (CBC) 251 (Result(s) reported on 30 Nov 2013 at 08:50AM.)  MCV 90  MCH 29.6  MCHC 32.7  RDW 12.7   EKG:  Interpretation EKG shows NSR with no significant ST or T wave changes   Radiology Results: XRay:    23-Oct-15 08:37, Chest Portable Single View  Chest Portable Single View   REASON FOR EXAM:    Chest Pain  COMMENTS:       PROCEDURE: DXR - DXR PORTABLE CHEST SINGLE VIEW  - Nov 30 2013  8:37AM     CLINICAL DATA:  Chest pain today, history of prior tobacco use    EXAM:  PORTABLE CHEST - 1 VIEW    COMPARISON:  None.    FINDINGS:  The heart size and mediastinal contours are within normal limits.  Both lungs are clear. The visualized skeletal structures are  unremarkable.     IMPRESSION:  No active disease.      Electronically Signed    By: Inez Catalina M.D.    On: 11/30/2013 08:41         Verified By: Everlene Farrier, M.D.,    NKDA: None  Vital Signs/Nurse's Notes: **Vital Signs.:   23-Oct-15 12:44  Vital Signs Type Admission  Temperature Temperature (F) 97.6  Celsius 36.4  Temperature Source oral  Pulse Pulse 87  Respirations Respirations 19  Systolic BP Systolic BP 829  Diastolic BP (mmHg) Diastolic BP (mmHg) 71  Mean BP 85  Pulse Ox % Pulse Ox % 96  Pulse Ox Activity Level  At rest  Oxygen Delivery Room Air/ 21 %    Impression 44 year old female who has a past history of a massive stroke 3 years ago, cerebral edema, craniotomy, residual  right upper limb and lower limb weakness, presentign with chest pain. Cardiology was consulted for severe chest pain sx.  1) Chest pain:  etiology not clear, prior smoking hx, hx of major CVA --EKG is essentially normal --troponin neg x 3,  CKMB elevated (musculoskeletal component?) --Discussed with family. They are very concerned with cardiac issue given a strong family hx. --They are requesting a stress test. --Will schedule a stress for tomrrow. She is unable  to treadmill. Leane Call will be ordered for Saturday. If unable to be completed, will arrange for outpt stress test. --echo pending to rule out other structural heart disease.   2) h/o CVA etioology not clear, she had significant workup  at that time. on aspirin  3) Residual right arm and leg deficits: from CVA ambulatory, somer gait difficulty  4)h/o smoking: stopped at the time of her CVA  5) h/o seizure:  on keeppra, followed by neurology   Electronic Signatures: Ida Rogue (MD)  (Signed 23-Oct-15 18:48)  Authored: General Aspect/Present Illness, History and Physical Exam, Review of System, Family & Social History, Past Medical History, Health Issues, Home Medications, Labs, EKG , Radiology, Allergies, Vital Signs/Nurse's Notes, Impression/Plan   Last Updated: 23-Oct-15 18:48 by Ida Rogue (MD)

## 2014-06-01 NOTE — Discharge Summary (Signed)
PATIENT NAME:  Melissa Day, Melissa Day MR#:  [id number] DATE OF BIRTH:  09-Oct-1970  DATE OF ADMISSION:  11/30/2013 DATE OF DISCHARGE:  12/02/2013  DISCHARGE DIAGNOSES:  1. Atypical chest pain, likely muscular.  2. History of cerebrovascular accident with residual deficits.  3. History of seizure.  DISCHARGE MEDICATIONS: Unchanged from home regimen. 1. Ferrous sulfate 325 mg 1 tab daily.  2. Fluoxetine 40 mg p.o. daily.  3. Vitamin C 500 mg daily.  4. Aspirin 81 mg daily.  5. Keppra 750 mg p.o. b.i.d.  6. Levothyroxine 100 mcg p.o. daily.  CONSULTS: Cardiology.   PROCEDURES: The patient had a Lexiscan stress test. The results are still pending.   PERTINENT LABS AND STUDIES: Prior to discharge, cardiac enzymes were negative x 3. EKG showed no acute changes. Prior to discharge, sodium 141, potassium 3.9, creatinine 0.81, HDL 31. A1c of 5.9, LDL of 94, triglycerides 133, and total cholesterol 152. The patient was noted to have a CK-MB elevation, 12.6, 14.9, 12.4, but troponins were all negative. TSH was 6.04. White blood cell count 10.4, hemoglobin 13.7, and platelets of 242.   BRIEF HOSPITAL COURSE:  1. Atypical chest pain. The patient initially came in with chest discomfort after increased exertion in prior days per family. She was evaluated by cardiology. Initial EKG was negative. Troponins were negative x 3. She was evaluated by Dr. Rockey Situ who not think that this was cardiac but, given the family's concerns, did undergo a stress study which is pending at this time. There were instructions on 10/24 by [facility] that, if the stress test was negative, could be discharged that day. The following day, the results were still not read. Therefore, I will put in orders to be able to be discharged if stress test is negative and follow-up with PCP who is Dr. Ginette Pitman. Otherwise, she is stable. We will resume home regimen.   DISPOSITION: She is stable for discharge to home.   FOLLOWUP: Follow up with  Dr. Ginette Pitman within 14 days.   ____________________________ Dion Body, MD kl:jh D: 12/02/2013 04:58:18 ET T: 12/02/2013 16:25:52 ET JOB#: [id number]  cc: Dion Body, MD, <Dictator> Dion Body MD ELECTRONICALLY SIGNED 12/03/2013 6:24

## 2014-06-21 ENCOUNTER — Other Ambulatory Visit: Payer: Self-pay | Admitting: *Deleted

## 2014-06-21 ENCOUNTER — Telehealth: Payer: Self-pay | Admitting: Physician Assistant

## 2014-06-21 MED ORDER — ATORVASTATIN CALCIUM 40 MG PO TABS: 40.0000 mg | ORAL_TABLET | Freq: Every day | ORAL | Status: DC

## 2014-06-21 NOTE — Telephone Encounter (Signed)
Rx sent for Atorvastatin to [facility] in [place].

## 2014-06-21 NOTE — Telephone Encounter (Signed)
Patients mother say sshe [facility].   She wants refills sent to: [facility]  Address: [address] [place], [place] [address]  [phone]

## 2014-08-01 ENCOUNTER — Ambulatory Visit: Payer: Commercial Managed Care - HMO | Admitting: Physician Assistant

## 2014-08-01 ENCOUNTER — Telehealth: Payer: Self-pay | Admitting: Physician Assistant

## 2014-08-01 NOTE — Telephone Encounter (Signed)
Looked at notes from [facility] 2015 admission and patient say [facility] as well.  Cancelled [name] appt.

## 2014-08-01 NOTE — Telephone Encounter (Signed)
Spoke w/ pt's mother. Advised her that we have created an opening w/ Christell Faith, PA this am @ 11:30, but she states that she lives in [place] and cannot get to [facility] to p/u pt and ger here in time.  She will keep scheduled appt on 7/13 w/ Dr. Rockey Situ and verbalizes understanding to take pt to ED if sx recur and/or become emergent.

## 2014-08-01 NOTE — Telephone Encounter (Signed)
Pt c/o of Chest Pain: STAT if CP now or developed within 24 hours  1. Are you having CP right now? No last episode Tuesday evening   2. Are you experiencing any other symptoms (ex. SOB, nausea, vomiting, sweating)? Left arm pain and around shoulder blades  3. How long have you been experiencing CP? 2 weeks ago short episode but intense Tuesday episode lasted longer   4. Is your CP continuous or coming and going? Comes and goes  5. Have you taken Nitroglycerin? No   Mother called patient is aphasic s/p stroke she wants [name] to see patient asap patient is overdue for fu but has never seen MD just [name] He does not have any open appt.   [name] has an 8 am opening on 06-30  Placed patient in this spot just incase needed.    ?

## 2014-08-08 ENCOUNTER — Ambulatory Visit: Payer: Commercial Managed Care - HMO | Admitting: Cardiovascular Disease

## 2014-08-10 ENCOUNTER — Emergency Department: Payer: Commercial Managed Care - HMO

## 2014-08-10 ENCOUNTER — Encounter: Payer: Self-pay | Admitting: Emergency Medicine

## 2014-08-10 ENCOUNTER — Emergency Department
Admission: EM | Admit: 2014-08-10 | Discharge: 2014-08-11 | Disposition: A | Discharge status: Home / Self Care | Payer: Commercial Managed Care - HMO | Attending: Student | Admitting: Student

## 2014-08-10 ENCOUNTER — Other Ambulatory Visit: Payer: Self-pay

## 2014-08-10 DIAGNOSIS — R42 Dizziness and giddiness: Secondary | ICD-10-CM | POA: Insufficient documentation

## 2014-08-10 DIAGNOSIS — M6281 Muscle weakness (generalized): Secondary | ICD-10-CM | POA: Insufficient documentation

## 2014-08-10 DIAGNOSIS — Z7982 Long term (current) use of aspirin: Secondary | ICD-10-CM | POA: Diagnosis not present

## 2014-08-10 DIAGNOSIS — Z3202 Encounter for pregnancy test, result negative: Secondary | ICD-10-CM | POA: Diagnosis not present

## 2014-08-10 DIAGNOSIS — IMO0002 Reserved for concepts with insufficient information to code with codable children: Secondary | ICD-10-CM

## 2014-08-10 DIAGNOSIS — R200 Anesthesia of skin: Secondary | ICD-10-CM | POA: Insufficient documentation

## 2014-08-10 DIAGNOSIS — Z79899 Other long term (current) drug therapy: Secondary | ICD-10-CM | POA: Insufficient documentation

## 2014-08-10 DIAGNOSIS — R079 Chest pain, unspecified: Secondary | ICD-10-CM | POA: Insufficient documentation

## 2014-08-10 DIAGNOSIS — I69320 Aphasia following cerebral infarction: Secondary | ICD-10-CM | POA: Diagnosis not present

## 2014-08-10 HISTORY — DX: Pure hypercholesterolemia, unspecified: E78.00

## 2014-08-10 LAB — URINALYSIS COMPLETE WITH MICROSCOPIC (ARMC ONLY)
BACTERIA UA: NONE SEEN
Bilirubin Urine: NEGATIVE
Glucose, UA: NEGATIVE mg/dL
Ketones, ur: NEGATIVE mg/dL
Nitrite: NEGATIVE
Protein, ur: NEGATIVE mg/dL
Specific Gravity, Urine: 1.025 (ref 1.005–1.030)
pH: 5 (ref 5.0–8.0)

## 2014-08-10 LAB — COMPREHENSIVE METABOLIC PANEL
ALBUMIN: 4 g/dL (ref 3.5–5.0)
ALK PHOS: 96 U/L (ref 38–126)
ALT: 24 U/L (ref 14–54)
AST: 21 U/L (ref 15–41)
Anion gap: 9 (ref 5–15)
BILIRUBIN TOTAL: 0.4 mg/dL (ref 0.3–1.2)
BUN: 10 mg/dL (ref 6–20)
CALCIUM: 8.9 mg/dL (ref 8.9–10.3)
CO2: 24 mmol/L (ref 22–32)
Chloride: 104 mmol/L (ref 101–111)
Creatinine, Ser: 0.65 mg/dL (ref 0.44–1.00)
GFR calc Af Amer: >60 mL/min (ref >60)
GFR calc non Af Amer: >60 mL/min (ref >60)
Glucose, Bld: 139 mg/dL — ABNORMAL HIGH (ref 65–99)
Potassium: 3.6 mmol/L (ref 3.5–5.1)
SODIUM: 137 mmol/L (ref 135–145)
Total Protein: 7.1 g/dL (ref 6.5–8.1)

## 2014-08-10 LAB — TROPONIN I: Troponin I: <0.03 ng/mL (ref <0.031)

## 2014-08-10 LAB — CBC
HEMATOCRIT: 42.5 % (ref 35.0–47.0)
Hemoglobin: 14.3 g/dL (ref 12.0–16.0)
MCH: 29.7 pg (ref 26.0–34.0)
MCHC: 33.6 g/dL (ref 32.0–36.0)
MCV: 88.3 fL (ref 80.0–100.0)
Platelets: 279 10*3/uL (ref 150–440)
RBC: 4.82 MIL/uL (ref 3.80–5.20)
RDW: 13.2 % (ref 11.5–14.5)
WBC: 11 10*3/uL (ref 3.6–11.0)

## 2014-08-10 NOTE — ED Notes (Signed)
Pt with history of cva with aphagia. Family is speaking for pt. Family states pt has been dizzy, has had intermittent chest pain for "weeks". Pt's family states pt has been nauseated. Pt denies the above symptoms. Per family pt states "i just don't feel right". Pt is unable to move right arm, right sided facial droop present, family states "it doesn't look worse".

## 2014-08-10 NOTE — ED Notes (Signed)
Report received from butch, rn.

## 2014-08-10 NOTE — ED Provider Notes (Addendum)
[facility] Emergency Department Provider Note  ____________________________________________  Time seen: Approximately 11:05 PM  I have reviewed the triage vital signs and the nursing notes.   HISTORY  Chief Complaint Dizziness  Caveat-history of present illness and review of systems somewhat limited secondary to the patient's aphasia. Review of systems and history of present illness is obtained in part from the patient as well as from family members at bedside.  HPI Melissa Day is a 44 y.o. female with history of hypothyroidism, hyperlipidemia, seizures, prior CVA with residual right-sided deficits and chronic aphasia since for evaluation of "not quite feeling right today". She reports that throughout the day she has had intermittent room spinning dizziness, she has felt today that her word finding difficulties/aphasia is slightly worse than usual. No new weakness or numbness. She also reports that she has had intermittent chest pains for several weeks. The last time she had any chest pain was 10 days ago. She reports the pain is usually gradual in onset, sometimes pleuritic. No modifying factors. No nausea, vomiting, diarrhea, fevers or chills. Current severity is mild.   Past Medical History  Diagnosis Date  . H/O: CVA (cerebrovascular accident) 2012    a. cerebral edema, craniotomy, residual right upper & lower limb weakness  . Hx of seizure disorder     a. one episode 10 months after CVA, since then on Keppra   . History of echocardiogram     a. 12/01/2013: EF 50-55%, nl global LV systolic function, mild MR, mildly increased LV posterior wall thickness  . History of stress test     a. 12/01/2013: no significant ischemia, no significant WMA, no EKG changes concerning for ischemia, EF 67%, low risk scan  . Heart murmur   . Thyroid disease   . Stroke   . High cholesterol     Patient Active Problem List   Diagnosis Date Noted  . H/O: CVA  (cerebrovascular accident)   . Hx of seizure disorder   . History of echocardiogram   . History of stress test   . Heart murmur   . Thyroid disease     Past Surgical History  Procedure Laterality Date  . Brain surgery      Current Outpatient Rx  Name  Route  Sig  Dispense  Refill  . aspirin 81 MG tablet   Oral   Take 81 mg by mouth daily.         Marland Kitchen atorvastatin (LIPITOR) 40 MG tablet   Oral   Take 1 tablet (40 mg total) by mouth daily.   30 tablet   3   . ferrous sulfate 325 (65 FE) MG tablet   Oral   Take 325 mg by mouth daily with breakfast.         . FLUoxetine (PROZAC) 40 MG capsule   Oral   Take 40 mg by mouth daily.         Marland Kitchen levETIRAcetam (KEPPRA) 750 MG tablet   Oral   Take 750 mg by mouth 2 (two) times daily.         Marland Kitchen levothyroxine (SYNTHROID, LEVOTHROID) 100 MCG tablet   Oral   Take 100 mcg by mouth daily before breakfast.         . vitamin C (ASCORBIC ACID) 500 MG tablet   Oral   Take 500 mg by mouth daily.           Allergies Review of patient's allergies indicates no known allergies.  Family History  Problem Relation Age of Onset  . Heart disease Father   . CVA Father     Social History History  Substance Use Topics  . Smoking status: Never Smoker   . Smokeless tobacco: Never Used  . Alcohol Use: No    Review of Systems Constitutional: No fever/chills Eyes: No visual changes. ENT: No sore throat. Cardiovascular: +chest pain. Respiratory: Denies shortness of breath. Gastrointestinal: No abdominal pain.  No nausea, no vomiting.  No diarrhea.  No constipation. Genitourinary: Negative for dysuria. Musculoskeletal: Negative for back pain. Skin: Negative for rash. Neurological: Negative for headaches, chronic right focal weakness and numbness.  10-point ROS otherwise negative.  ____________________________________________   PHYSICAL EXAM:  VITAL SIGNS: ED Triage Vitals  Enc Vitals Group     BP 08/10/14 2043  135/78 mmHg     Pulse Rate 08/10/14 2043 91     Resp 08/10/14 2043 14     Temp 08/10/14 2043 98.1 F (36.7 C)     Temp Source 08/10/14 2043 Oral     SpO2 08/10/14 2043 97 %     Weight 08/10/14 2043 200 lb (90.719 kg)     Height 08/10/14 2043 5\' 3"  (1.6 m)     Head Cir --      Peak Flow --      Pain Score 08/10/14 2045 9     Pain Loc --      Pain Edu? --      Excl. in [place]? --     Constitutional: Alert and oriented. Well appearing and in no acute distress. Eyes: Conjunctivae are normal. PERRL. EOMI. Head: Atraumatic. Nose: No congestion/rhinnorhea. Mouth/Throat: Mucous membranes are moist.  Oropharynx non-erythematous. Neck: No stridor.  Cardiovascular: Normal rate, regular rhythm. Grossly normal heart sounds.  Good peripheral circulation. Respiratory: Normal respiratory effort.  No retractions. Lungs CTAB. Gastrointestinal: Soft and nontender. No distention. No abdominal bruits. No CVA tenderness. Genitourinary: deferred Musculoskeletal: No lower extremity tenderness nor edema.  No joint effusions. Neurologic: The patient has intact receptive skills/comprehension but does have some difficulty finding her words/attending to express herself which is chronic, she is able to lift the right hand off the bed but has motor movement of the right fingers, she has muscle wasting of the right hand, she has 4+ out of 5 strength in the right lower extremity, decreased sensation all throughout the right upper and right lower extremity, 5 out of 5 strength and intact sensation in the left upper and left lower extremity. Cranial nerves II through XII intact. Normal and her nose finger in the left arm. Skin:  Skin is warm, dry and intact. No rash noted. Psychiatric: Mood and affect are normal. Speech and behavior are normal.  ____________________________________________   LABS (all labs ordered are listed, but only abnormal results are displayed)  Labs Reviewed  COMPREHENSIVE METABOLIC PANEL -  Abnormal; Notable for the following:    Glucose, Bld 139 (*)    All other components within normal limits  URINALYSIS COMPLETEWITH MICROSCOPIC ([facility] ONLY) - Abnormal; Notable for the following:    Color, Urine YELLOW (*)    APPearance HAZY (*)    Hgb urine dipstick 1+ (*)    Leukocytes, UA TRACE (*)    Squamous Epithelial / LPF 0-5 (*)    All other components within normal limits  CBC  TROPONIN I  PREGNANCY, URINE  FIBRIN DERIVATIVES D-DIMER ([facility] ONLY)   ____________________________________________  EKG  ED ECG REPORT I, Joanne Gavel, the attending  physician, personally viewed and interpreted this ECG.   Date: 08/11/2014  EKG Time: 20:57  Rate: 78  Rhythm: normal sinus rhythm  Axis: normal  Intervals:none  ST&T Change: No acute ST segment elevation, minimal voltage criteria for LVH  ____________________________________________  RADIOLOGY  CT head  IMPRESSION: No acute findings. Encephalomalacia due to prior left frontal infarction. There also are much smaller areas of encephalomalacia in the posterior parietal lobes bilaterally. This is unchanged from 09/30/2011.   CXR IMPRESSION: No active cardiopulmonary disease.  MRI brain IMPRESSION: 1. No acute intracranial infarct or other abnormality identified. 2. Encephalomalacia within the left frontal lobe, likely related to remote left MCA territory infarct. Additional smaller chronic infarcts as detailed above. 3. Mild atrophy with chronic small vessel ischemic disease. ____________________________________________   PROCEDURES  Procedure(s) performed: None  Critical Care performed: No  ____________________________________________   INITIAL IMPRESSION / ASSESSMENT AND PLAN / ED COURSE  Pertinent labs & imaging results that were available during my care of the patient were reviewed by me and considered in my medical decision making (see chart for details).  Melissa Day is a 44 y.o. female with  history of hypothyroidism, hyperlipidemia, seizures, prior CVA with residual right-sided deficits and chronic aphasia since for evaluation of "not quite feeling right today". On exam, she is generally well-appearing and in no acute distress. Vital signs stable and she is afebrile. Her neuro exam is unchanged from prior with the exception of the fact that she subjectively feels as if she is having a more difficult time getting her words out. Given her history of CVA, her dizziness today and concern for worsening aphasia, we'll obtain MRI of her brain to evaluate for any new CVA. Her chest pain is somewhat vague, she is not had it for almost 2 weeks, EKG is reassuring, first troponin negative. Given pleuritic nature of her chest pain, we'll obtain d-dimer and pursue CTA chest is elevated. We'll obtain screening labs, urinalysis. Reassess for disposition.  ----------------------------------------- 3:05 AM on 08/11/2014 -----------------------------------------  MRI brain shows no evidence of acute infarct or other abnormality. D-dimer not elevated, doubt PE. Troponin negative 1 and the patient has not had chest pain in over 10 days making ACS unlikely. Doubt acute aortic dissection. Urinalysis with trace leuk esterase, 6-30 red blood cells but no bacteria and negative nitrites, very few white blood cells. I discussed this with the patient, need for reevaluation and the fact that we'll send this for culture. Labs otherwise unremarkable. Discussed return precautions and close PCP follow-up. She and family at bedside are comfortable with discharge home. ____________________________________________   FINAL CLINICAL IMPRESSION(S) / ED DIAGNOSES  Final diagnoses:  Dizzy  Aphasia due to stroke      Joanne Gavel, MD 08/11/14 [id number]  Joanne Gavel, MD 08/11/14 [id number]

## 2014-08-11 ENCOUNTER — Emergency Department: Payer: Commercial Managed Care - HMO

## 2014-08-11 ENCOUNTER — Encounter: Payer: Self-pay | Admitting: Emergency Medicine

## 2014-08-11 DIAGNOSIS — R42 Dizziness and giddiness: Secondary | ICD-10-CM | POA: Diagnosis not present

## 2014-08-11 LAB — FIBRIN DERIVATIVES D-DIMER (ARMC ONLY): Fibrin derivatives D-dimer (ARMC): 222 (ref 0–499)

## 2014-08-11 LAB — PREGNANCY, URINE: PREG TEST UR: NEGATIVE

## 2014-08-11 NOTE — ED Notes (Signed)
Pt to mri 

## 2014-08-11 NOTE — ED Notes (Signed)
Pt returned from mri

## 2014-08-12 LAB — URINE CULTURE: Special Requests: NORMAL

## 2014-08-21 ENCOUNTER — Ambulatory Visit (INDEPENDENT_AMBULATORY_CARE_PROVIDER_SITE_OTHER): Payer: Commercial Managed Care - HMO | Admitting: Cardiovascular Disease

## 2014-08-21 ENCOUNTER — Encounter: Payer: Self-pay | Admitting: Cardiovascular Disease

## 2014-08-21 VITALS — BP 110/80 | HR 76 | Ht 63.0 in | Wt 216.5 lb

## 2014-08-21 DIAGNOSIS — Z8673 Personal history of transient ischemic attack (TIA), and cerebral infarction without residual deficits: Secondary | ICD-10-CM

## 2014-08-21 DIAGNOSIS — R0789 Other chest pain: Secondary | ICD-10-CM

## 2014-08-21 DIAGNOSIS — R5383 Other fatigue: Secondary | ICD-10-CM

## 2014-08-21 DIAGNOSIS — R079 Chest pain, unspecified: Secondary | ICD-10-CM

## 2014-08-21 DIAGNOSIS — R5381 Other malaise: Secondary | ICD-10-CM | POA: Insufficient documentation

## 2014-08-21 DIAGNOSIS — E079 Disorder of thyroid, unspecified: Secondary | ICD-10-CM

## 2014-08-21 MED ORDER — LEVETIRACETAM 750 MG PO TABS: 750.0000 mg | ORAL_TABLET | Freq: Two times a day (BID) | ORAL | Status: DC

## 2014-08-21 NOTE — Assessment & Plan Note (Addendum)
Etiology of her symptoms is unclear. Likely noncardiac Hospital records and lab work reviewed with her from recent ER visit She did report some vertigo, GI upset, resolved without intervention. ER workup was normal

## 2014-08-21 NOTE — Assessment & Plan Note (Signed)
Chest pain symptoms are very atypical, likely noncardiac. None recently No further workup at this time

## 2014-08-21 NOTE — Assessment & Plan Note (Signed)
No new neurologic deficits. No further workup at this time

## 2014-08-21 NOTE — Assessment & Plan Note (Signed)
We spend much of her visit talking about her diet and possible dietary changes. Dietary guide provided Recommended a regular walking program

## 2014-08-21 NOTE — Progress Notes (Signed)
Patient ID: JAMES LAFALCE, female    DOB: July 12, 1970, 44 y.o.   MRN: [id number]  HPI Comments: 44 y.o. female with H/O: CVA (cerebrovascular accident) 2012,  cerebral edema, craniotomy, residual right upper & lower limb weakness, seizure disorder, one episode 10 months after CVA, since then on Keppra  Hypothyroidism, presenting for malaise, chest discomfort Episode of chest discomfort in October 2015 with evaluation in the hospital at that time with normal echocardiogram and stress test  In follow-up today, she was recently in the emergency room for general malaise. She told family she did not feel right for a couple of days. She had vertigo, GI upset, unclear if she had chest discomfort. She is unable to detail her symptoms clearly. History provided by her mother. Family members a nurse, examined the patient, did not see anything acutely wrong but suggested she go to the emergency room. EKG and cardiac enzymes normal. D-dimer normal. scan of the head normal and she was discharged home ER notes suggest she had no chest pain symptoms for at least 10 days from the day of evaluation No further symptoms since she has been home  Currently he is active, no further vertigo No new neurologic deficits  EKG on today's visit shows normal sinus rhythm with rate 76 bpm, no significant ST or T-wave changes  Other past medical history  12/01/2013: EF 50-55%, nl global LV systolic function, mild MR, mildly increased LV posterior wall thickness .History of stress test  12/01/2013: no significant ischemia, no significant WMA, no EKG changes concerning for ischemia, EF 67%, low risk scan  stroke in 2012 --> cerebral edema, craniotomy, right sided weakness that was treated at [facility]. She has remained on aspirin 81 mg since.  History of smoking up until her stroke  hospitalization at [facility] 11/2013 with left sided chest pain that radiated to her left arm. She had recently used her arm to pull herself up some  stairs with a pulley-like system. Troponin were negative. She underwent Lexiscan Myoview that was Low risk, no significant ischemia, no significant wall motion abnormalities, EF 67%. Echo showed EF of 50-55%, mild MR and was otherwise normal. Chest pain was felt to be atypical in etiology.   Cardiac event monitoring  reveals NSR.    No Known Allergies  Current Outpatient Prescriptions on File Prior to Visit  Medication Sig Dispense Refill  . aspirin 81 MG tablet Take 81 mg by mouth daily.    Marland Kitchen atorvastatin (LIPITOR) 40 MG tablet Take 1 tablet (40 mg total) by mouth daily. 30 tablet 3  . ferrous sulfate 325 (65 FE) MG tablet Take 325 mg by mouth daily with breakfast.    . FLUoxetine (PROZAC) 40 MG capsule Take 40 mg by mouth daily.    Marland Kitchen levothyroxine (SYNTHROID, LEVOTHROID) 100 MCG tablet Take 100 mcg by mouth daily before breakfast.    . vitamin C (ASCORBIC ACID) 500 MG tablet Take 500 mg by mouth daily.     No current facility-administered medications on file prior to visit.    Past Medical History  Diagnosis Date  . H/O: CVA (cerebrovascular accident) 2012    a. cerebral edema, craniotomy, residual right upper & lower limb weakness  . Hx of seizure disorder     a. one episode 10 months after CVA, since then on Keppra   . History of echocardiogram     a. 12/01/2013: EF 50-55%, nl global LV systolic function, mild MR, mildly increased LV posterior wall thickness  .  History of stress test     a. 12/01/2013: no significant ischemia, no significant WMA, no EKG changes concerning for ischemia, EF 67%, low risk scan  . Heart murmur   . Thyroid disease   . Stroke   . High cholesterol     Past Surgical History  Procedure Laterality Date  . Brain surgery      Social History  reports that she has quit smoking. She has never used smokeless tobacco. She reports that she does not drink alcohol or use illicit drugs.  Family History family history includes CVA in her father; Heart  disease in her father.   Review of Systems  Constitutional: Negative.   Respiratory: Negative.   Cardiovascular: Negative.   Gastrointestinal: Negative.   Musculoskeletal: Negative.   Allergic/Immunologic: Negative.   Neurological: Negative.   Hematological: Negative.   Psychiatric/Behavioral: Negative.   All other systems reviewed and are negative.   BP 110/80 mmHg  Pulse 76  Ht 5\' 3"  (1.6 m)  Wt 216 lb 8 oz (98.204 kg)  BMI 38.36 kg/m2  Physical Exam  Constitutional: She is oriented to person, place, and time. She appears well-developed and well-nourished.  HENT:  Head: Normocephalic.  Nose: Nose normal.  Mouth/Throat: Oropharynx is clear and moist.  Eyes: Conjunctivae are normal. Pupils are equal, round, and reactive to light.  Neck: Normal range of motion. Neck supple. No JVD present.  Cardiovascular: Normal rate, regular rhythm, S1 normal, S2 normal, normal heart sounds and intact distal pulses.  Exam reveals no gallop and no friction rub.   No murmur heard. Obese  Pulmonary/Chest: Effort normal and breath sounds normal. No respiratory distress. She has no wheezes. She has no rales. She exhibits no tenderness.  Abdominal: Soft. Bowel sounds are normal. She exhibits no distension. There is no tenderness.  Musculoskeletal: Normal range of motion. She exhibits no edema or tenderness.  Lymphadenopathy:    She has no cervical adenopathy.  Neurological: She is alert and oriented to person, place, and time. Coordination normal.  Skin: Skin is warm and dry. No rash noted. No erythema.  Psychiatric: She has a normal mood and affect. Her behavior is normal. Judgment and thought content normal.    Assessment and Plan  Nursing note and vitals reviewed.

## 2014-08-21 NOTE — Assessment & Plan Note (Signed)
History of hypothyroidism, on supplemental medication

## 2014-08-21 NOTE — Patient Instructions (Signed)
You are doing well. No medication changes were made.  Please call us if you have new issues that need to be addressed before your next appt.  Your physician wants you to follow-up in: 12 months.  You will receive a reminder letter in the mail two months in advance. If you don't receive a letter, please call our office to schedule the follow-up appointment. 

## 2014-10-11 ENCOUNTER — Encounter: Payer: Self-pay | Admitting: *Deleted

## 2014-10-19 ENCOUNTER — Encounter: Payer: Self-pay | Admitting: *Deleted

## 2014-10-19 ENCOUNTER — Emergency Department
Admission: EM | Admit: 2014-10-19 | Discharge: 2014-10-19 | Discharge status: Against Medical Advice | Payer: Commercial Managed Care - HMO | Attending: Emergency Medicine | Admitting: Emergency Medicine

## 2014-10-19 DIAGNOSIS — R109 Unspecified abdominal pain: Secondary | ICD-10-CM | POA: Insufficient documentation

## 2014-10-19 DIAGNOSIS — Z87891 Personal history of nicotine dependence: Secondary | ICD-10-CM | POA: Diagnosis not present

## 2014-10-19 DIAGNOSIS — R300 Dysuria: Secondary | ICD-10-CM | POA: Diagnosis present

## 2014-10-19 DIAGNOSIS — R110 Nausea: Secondary | ICD-10-CM | POA: Insufficient documentation

## 2014-10-19 LAB — CBC WITH DIFFERENTIAL/PLATELET
Basophils Absolute: 0.1 10*3/uL (ref 0–0.1)
Basophils Relative: 1 %
Eosinophils Absolute: 0.2 10*3/uL (ref 0–0.7)
Eosinophils Relative: 1 %
HCT: 40.6 % (ref 35.0–47.0)
HEMOGLOBIN: 13.6 g/dL (ref 12.0–16.0)
LYMPHS ABS: 4 10*3/uL — AB (ref 1.0–3.6)
LYMPHS PCT: 31 %
MCH: 29.9 pg (ref 26.0–34.0)
MCHC: 33.6 g/dL (ref 32.0–36.0)
MCV: 89.1 fL (ref 80.0–100.0)
Monocytes Absolute: 0.8 10*3/uL (ref 0.2–0.9)
Monocytes Relative: 6 %
NEUTROS ABS: 7.7 10*3/uL — AB (ref 1.4–6.5)
NEUTROS PCT: 61 %
Platelets: 228 10*3/uL (ref 150–440)
RBC: 4.56 MIL/uL (ref 3.80–5.20)
RDW: 13.7 % (ref 11.5–14.5)
WBC: 12.8 10*3/uL — ABNORMAL HIGH (ref 3.6–11.0)

## 2014-10-19 LAB — URINALYSIS COMPLETE WITH MICROSCOPIC (ARMC ONLY)
Bilirubin Urine: NEGATIVE
Glucose, UA: NEGATIVE mg/dL
Ketones, ur: NEGATIVE mg/dL
Nitrite: NEGATIVE
PH: 5 (ref 5.0–8.0)
PROTEIN: 30 mg/dL — AB
Specific Gravity, Urine: 1.024 (ref 1.005–1.030)

## 2014-10-19 LAB — COMPREHENSIVE METABOLIC PANEL
ALK PHOS: 76 U/L (ref 38–126)
ALT: 21 U/L (ref 14–54)
AST: 25 U/L (ref 15–41)
Albumin: 4 g/dL (ref 3.5–5.0)
Anion gap: 9 (ref 5–15)
BUN: 8 mg/dL (ref 6–20)
CO2: 27 mmol/L (ref 22–32)
CREATININE: 0.76 mg/dL (ref 0.44–1.00)
Calcium: 8.9 mg/dL (ref 8.9–10.3)
Chloride: 106 mmol/L (ref 101–111)
Glucose, Bld: 164 mg/dL — ABNORMAL HIGH (ref 65–99)
Potassium: 3.7 mmol/L (ref 3.5–5.1)
Sodium: 142 mmol/L (ref 135–145)
Total Bilirubin: 0.6 mg/dL (ref 0.3–1.2)
Total Protein: 6.9 g/dL (ref 6.5–8.1)

## 2014-10-19 LAB — LIPASE, BLOOD: LIPASE: 30 U/L (ref 22–51)

## 2014-10-19 NOTE — ED Notes (Signed)
Pt has right side abd pain with nausea.  Pt also reports dysuria.  Pt states her stomach feels sore for 1 week.

## 2014-10-21 ENCOUNTER — Encounter: Payer: Self-pay | Admitting: Emergency Medicine

## 2014-10-21 ENCOUNTER — Emergency Department: Payer: Commercial Managed Care - HMO

## 2014-10-21 ENCOUNTER — Encounter: Payer: Self-pay | Admitting: Urology

## 2014-10-21 ENCOUNTER — Emergency Department
Admission: EM | Admit: 2014-10-21 | Discharge: 2014-10-21 | Disposition: A | Discharge status: Home / Self Care | Payer: Commercial Managed Care - HMO | Attending: Emergency Medicine | Admitting: Emergency Medicine

## 2014-10-21 ENCOUNTER — Ambulatory Visit (INDEPENDENT_AMBULATORY_CARE_PROVIDER_SITE_OTHER): Payer: Commercial Managed Care - HMO | Admitting: Urology

## 2014-10-21 DIAGNOSIS — R310 Gross hematuria: Secondary | ICD-10-CM | POA: Diagnosis not present

## 2014-10-21 DIAGNOSIS — Z87891 Personal history of nicotine dependence: Secondary | ICD-10-CM | POA: Insufficient documentation

## 2014-10-21 DIAGNOSIS — Z3202 Encounter for pregnancy test, result negative: Secondary | ICD-10-CM | POA: Insufficient documentation

## 2014-10-21 DIAGNOSIS — N390 Urinary tract infection, site not specified: Secondary | ICD-10-CM

## 2014-10-21 DIAGNOSIS — N201 Calculus of ureter: Secondary | ICD-10-CM | POA: Diagnosis not present

## 2014-10-21 DIAGNOSIS — Z7982 Long term (current) use of aspirin: Secondary | ICD-10-CM | POA: Diagnosis not present

## 2014-10-21 DIAGNOSIS — Z79899 Other long term (current) drug therapy: Secondary | ICD-10-CM | POA: Insufficient documentation

## 2014-10-21 DIAGNOSIS — R1031 Right lower quadrant pain: Secondary | ICD-10-CM

## 2014-10-21 DIAGNOSIS — R109 Unspecified abdominal pain: Secondary | ICD-10-CM | POA: Diagnosis present

## 2014-10-21 LAB — COMPREHENSIVE METABOLIC PANEL
ALK PHOS: 76 U/L (ref 38–126)
ALT: 27 U/L (ref 14–54)
AST: 26 U/L (ref 15–41)
Albumin: 4 g/dL (ref 3.5–5.0)
Anion gap: 7 (ref 5–15)
BILIRUBIN TOTAL: 0.6 mg/dL (ref 0.3–1.2)
BUN: 8 mg/dL (ref 6–20)
CO2: 24 mmol/L (ref 22–32)
CREATININE: 0.76 mg/dL (ref 0.44–1.00)
Calcium: 9.1 mg/dL (ref 8.9–10.3)
Chloride: 105 mmol/L (ref 101–111)
GFR calc Af Amer: >60 mL/min (ref >60)
GFR calc non Af Amer: >60 mL/min (ref >60)
Glucose, Bld: 81 mg/dL (ref 65–99)
Potassium: 3.6 mmol/L (ref 3.5–5.1)
Sodium: 136 mmol/L (ref 135–145)
TOTAL PROTEIN: 6.9 g/dL (ref 6.5–8.1)

## 2014-10-21 LAB — URINALYSIS COMPLETE WITH MICROSCOPIC (ARMC ONLY)
BILIRUBIN URINE: NEGATIVE
GLUCOSE, UA: NEGATIVE mg/dL
Ketones, ur: NEGATIVE mg/dL
Nitrite: NEGATIVE
Protein, ur: 30 mg/dL — AB
Specific Gravity, Urine: 1.02 (ref 1.005–1.030)
pH: 5 (ref 5.0–8.0)

## 2014-10-21 LAB — CBC WITH DIFFERENTIAL/PLATELET
BASOS ABS: 0.1 10*3/uL (ref 0–0.1)
BASOS PCT: 1 %
EOS ABS: 0.2 10*3/uL (ref 0–0.7)
Eosinophils Relative: 2 %
HEMATOCRIT: 41.5 % (ref 35.0–47.0)
Hemoglobin: 13.8 g/dL (ref 12.0–16.0)
Lymphocytes Relative: 31 %
Lymphs Abs: 3.3 10*3/uL (ref 1.0–3.6)
MCH: 29.8 pg (ref 26.0–34.0)
MCHC: 33.2 g/dL (ref 32.0–36.0)
MCV: 89.8 fL (ref 80.0–100.0)
MONO ABS: 0.8 10*3/uL (ref 0.2–0.9)
MONOS PCT: 8 %
NEUTROS ABS: 6.2 10*3/uL (ref 1.4–6.5)
Neutrophils Relative %: 58 %
PLATELETS: 234 10*3/uL (ref 150–440)
RBC: 4.63 MIL/uL (ref 3.80–5.20)
RDW: 13.4 % (ref 11.5–14.5)
WBC: 10.5 10*3/uL (ref 3.6–11.0)

## 2014-10-21 LAB — CBC
HCT: 41.4 % (ref 35.0–47.0)
Hemoglobin: 14 g/dL (ref 12.0–16.0)
MCH: 29.8 pg (ref 26.0–34.0)
MCHC: 33.7 g/dL (ref 32.0–36.0)
MCV: 88.5 fL (ref 80.0–100.0)
PLATELETS: 242 10*3/uL (ref 150–440)
RBC: 4.68 MIL/uL (ref 3.80–5.20)
RDW: 13.5 % (ref 11.5–14.5)
WBC: 10.5 10*3/uL (ref 3.6–11.0)

## 2014-10-21 LAB — LIPASE, BLOOD: Lipase: 23 U/L (ref 22–51)

## 2014-10-21 LAB — POCT PREGNANCY, URINE: Preg Test, Ur: NEGATIVE

## 2014-10-21 LAB — PREGNANCY, URINE: Preg Test, Ur: NEGATIVE

## 2014-10-21 MED ORDER — TAMSULOSIN HCL 0.4 MG PO CAPS
0.8000 mg | ORAL_CAPSULE | Freq: Once | ORAL | Status: AC
  Administered 2014-10-21: 0.8 mg via ORAL
  Filled 2014-10-21: qty 2

## 2014-10-21 MED ORDER — AMOXICILLIN-POT CLAVULANATE 875-125 MG PO TABS
1.0000 | ORAL_TABLET | Freq: Once | ORAL | Status: AC
  Administered 2014-10-21: 1 via ORAL
  Filled 2014-10-21: qty 1

## 2014-10-21 MED ORDER — TAMSULOSIN HCL 0.4 MG PO CAPS: 0.4000 mg | ORAL_CAPSULE | Freq: Every day | ORAL | Status: DC

## 2014-10-21 MED ORDER — OXYCODONE-ACETAMINOPHEN 5-325 MG PO TABS: 1.0000 | ORAL_TABLET | Freq: Four times a day (QID) | ORAL | Status: DC | PRN

## 2014-10-21 MED ORDER — IOHEXOL 350 MG/ML SOLN
100.0000 mL | Freq: Once | INTRAVENOUS | Status: DC | PRN
Start: 2014-10-21 — End: 2014-10-22
  Filled 2014-10-21: qty 100

## 2014-10-21 MED ORDER — IOHEXOL 240 MG/ML SOLN
25.0000 mL | Freq: Once | INTRAMUSCULAR | Status: AC | PRN
  Administered 2014-10-21: 25 mL via ORAL
  Filled 2014-10-21: qty 25

## 2014-10-21 MED ORDER — AMOXICILLIN-POT CLAVULANATE 875-125 MG PO TABS: 1.0000 | ORAL_TABLET | Freq: Two times a day (BID) | ORAL | Status: DC

## 2014-10-21 MED ORDER — SODIUM CHLORIDE 0.9 % IV BOLUS (SEPSIS)
1000.0000 mL | Freq: Once | INTRAVENOUS | Status: AC
  Administered 2014-10-21: 1000 mL via INTRAVENOUS

## 2014-10-21 NOTE — Consult Note (Signed)
CC: Recurrent RLQ pain, diarrhea, anorexia, hematuria  HPI: [name] is a pleasant 44 yo F with a history of CVA with residual right sided weakness and aphasia who presents with intermittent RLQ pain and hematuria.  Pain initially began 6 days ago, lasted a few hours, then resolved.  Had another episode 3 days ago for which she arrived to the ER, the pain disappeared.  She then left without being seen and without pain medications.  Had another episode today which has since significantly resided.  Has had nausea for the past few days as well as diarrhea x 3-4 episodes.  Pain comes in waves.  Hematuria has been present x 2 weeks.  + intermittent chills.  No fevers/chest pain, shortness of breath, cough, dysuria.  U/A shows 3+ LE, CT shows mildly dilated appendix with no periappendiceal stranding and fluid collection.  Active Ambulatory Problems    Diagnosis Date Noted  . H/O: CVA (cerebrovascular accident)   . Hx of seizure disorder   . History of echocardiogram   . History of stress test   . Heart murmur   . Thyroid disease   . Atypical chest pain 08/21/2014  . Malaise and fatigue 08/21/2014  . Morbid obesity 08/21/2014  . Clinical depression 12/12/2013  . 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 Vitamin D deficiency 12/12/2013   Resolved Ambulatory Problems    Diagnosis Date Noted  . No Resolved Ambulatory Problems   Past Medical History  Diagnosis Date  . Stroke   . High cholesterol    Past Surgical History  Procedure Laterality Date  . Brain surgery       Medication List    ASK your doctor about these medications        aspirin 81 MG tablet  Take 81 mg by mouth daily.     atorvastatin 40 MG tablet  Commonly known as:  LIPITOR  Take 1 tablet (40 mg total) by mouth daily.     DOCOSAHEXAENOIC ACID PO     ferrous sulfate 325 (65 FE) MG tablet  Take 325 mg by mouth daily with breakfast.     FLUoxetine 40 MG capsule  Commonly known as:  PROZAC   Take 40 mg by mouth daily.     levETIRAcetam 750 MG tablet  Commonly known as:  KEPPRA  Take 1 tablet (750 mg total) by mouth 2 (two) times daily.     levothyroxine 100 MCG tablet  Commonly known as:  SYNTHROID, LEVOTHROID  Take 100 mcg by mouth daily before breakfast.     vitamin C 500 MG tablet  Commonly known as:  ASCORBIC ACID  Take 500 mg by mouth daily.      No Known Allergies   ROS: Full ROS obtained, pertinent positives and negatives as above  Blood pressure 135/81, pulse 79, temperature 97.8 F (36.6 C), temperature source Oral, resp. rate 20, height 5\' 4"  (1.626 m), weight 212 lb (96.163 kg), SpO2 98 %. GEN: NAD/A&Ox3 FACE: no obvious facial trauma, normal external nose, normal external ears EYES: no scleral icterus, no conjunctivitis HEAD: normocephalic atraumatic CV: RRR, no MRG RESP: moving air well, lungs clear ABD: soft, reports pain right flank/RLQ but no peritoneal signs, nondistended NEURO: cnII-XII grossly intact, sensation intact all 4 ext  Labs: Personally reviewed, significant for  WBC 10.5 (diff pending)  U/A - 3+ ULE, RBC 6-30  CT: 1 cm mildly prominent appendix but without periappendiceal stranding/fluid 5 mm stone at distal right ureter above  UVJ, low grade obstruction  A/P 44 yo F with recurrent RLQ/right flank pain, mildly dilated appendix.  Unlikely appendicitis in my opinion due to the fact that patient is relatively nontender to exam, appendicitis usually does not recur or resolve spontaneously.  Also WBC 10.5, CT relatively unremarkable for 6 days of pain.  Have spoken with Dr. Clearnce Hasten, who was spoken to urology who will see patient as outpatient.  He was planning on outpatient abx for partially obstructed kidney stone, would recommend broadening to augmentin which would treat appendicitis nonoperatively, which is a completely viable option.  Will advise patient to return to ER if symptoms worsen.

## 2014-10-21 NOTE — Progress Notes (Signed)
Consultation for gross hematuria.  1-gross hematuria-for the past few weeks patient has noted red urine voids. No clots. She's had no dysuria or no history of kidney stones. She has had some diffuse abdominal pain which might radiate over to the right flank. She is a former smoker and quit smoking after she had a stroke 4 years ago. She has a right-sided hemiparesis. She is also had some dark stools as well as some bright red blood per rectum and his been referred to GI. She typically voids with a good stream and has some occasional frequency. No urgency or incontinence.  Past medical history significant for stroke and she is now maintained on Keppra and aspirin 81 mg. She was also taking vitamin C 500 mg in the past to "keep her immune system healthy".  Her past medical history, surgical history, family history, social history, medications and allergies were reviewed and significant findings noted above.  A 14 system review of systems was obtained and was negative except for the following: Heartburn, diarrhea, fatigue, skin rash, easy bruising, excessive thirst, back pain, depression, anxiety  Physical exam: Patient in no acute distress Right-sided hemiparesis  Assessment-gross hematuria associated with some diffuse abdominal pain and possible right flank pain. Prior smoker.   Plan-I discussed with the patient and her family the nature risks benefits and alternatives to CT scan with IV contrast and cystoscopy. All questions answered and she elects to proceed.

## 2014-10-21 NOTE — ED Notes (Signed)
Sister states increased right sided abd pain, states foul smelling stool with no form, states bloody urine and some nausea, pt has hx of stroke

## 2014-10-21 NOTE — ED Notes (Signed)
Per family she is having abd pain with bloody stools and some blood in urine.  Pos nausea  Pain is mainly to right side

## 2014-10-21 NOTE — Discharge Instructions (Signed)
Abdominal Pain Many things can cause belly (abdominal) pain. Most times, the belly pain is not dangerous. Many cases of belly pain can be watched and treated at home. HOME CARE   Do not take medicines that help you go poop (laxatives) unless told to by your doctor.  Only take medicine as told by your doctor.  Eat or drink as told by your doctor. Your doctor will tell you if you should be on a special diet. GET HELP IF:  You do not know what is causing your belly pain.  You have belly pain while you are sick to your stomach (nauseous) or have runny poop (diarrhea).  You have pain while you pee or poop.  Your belly pain wakes you up at night.  You have belly pain that gets worse or better when you eat.  You have belly pain that gets worse when you eat fatty foods.  You have a fever. GET HELP RIGHT AWAY IF:   The pain does not go away within 2 hours.  You keep throwing up (vomiting).  The pain changes and is only in the right or left part of the belly.  You have bloody or tarry looking poop. MAKE SURE YOU:   Understand these instructions.  Will watch your condition.  Will get help right away if you are not doing well or get worse. Document Released: 07/14/2007 Document Revised: 01/30/2013 Document Reviewed: 10/04/2012 [facility] Patient Information 2015 [place], [place]. This information is not intended to replace advice given to you by your health care provider. Make sure you discuss any questions you have with your health care provider.  Kidney Stones Kidney stones (urolithiasis) are solid masses that form inside your kidneys. The intense pain is caused by the stone moving through the kidney, ureter, bladder, and urethra (urinary tract). When the stone moves, the ureter starts to spasm around the stone. The stone is usually passed in your pee (urine).  HOME CARE  Drink enough fluids to keep your pee clear or pale yellow. This helps to get the stone out.  Strain all pee  through the provided strainer. Do not pee without peeing through the strainer, not even once. If you pee the stone out, catch it in the strainer. The stone may be as small as a grain of salt. Take this to your doctor. This will help your doctor figure out what you can do to try to prevent more kidney stones.  Only take medicine as told by your doctor.  Follow up with your doctor as told.  Get follow-up X-rays as told by your doctor. GET HELP IF: You have pain that gets worse even if you have been taking pain medicine. GET HELP RIGHT AWAY IF:   Your pain does not get better with medicine.  You have a fever or shaking chills.  Your pain increases and gets worse over 18 hours.  You have new belly (abdominal) pain.  You feel faint or pass out.  You are unable to pee. MAKE SURE YOU:   Understand these instructions.  Will watch your condition.  Will get help right away if you are not doing well or get worse. Document Released: 07/14/2007 Document Revised: 09/27/2012 Document Reviewed: 06/28/2012 [facility] Patient Information 2015 [employer], [place]. This information is not intended to replace advice given to you by your health care provider. Make sure you discuss any questions you have with your health care provider.  Urinary Tract Infection A urinary tract infection (UTI) can occur any place  along the urinary tract. The tract includes the kidneys, ureters, bladder, and urethra. A type of germ called bacteria often causes a UTI. UTIs are often helped with antibiotic medicine.  HOME CARE   If given, take antibiotics as told by your doctor. Finish them even if you start to feel better.  Drink enough fluids to keep your pee (urine) clear or pale yellow.  Avoid tea, drinks with caffeine, and bubbly (carbonated) drinks.  Pee often. Avoid holding your pee in for a long time.  Pee before and after having sex (intercourse).  Wipe from front to back after you poop (bowel movement) if you  are a woman. Use each tissue only once. GET HELP RIGHT AWAY IF:   You have back pain.  You have lower belly (abdominal) pain.  You have chills.  You feel sick to your stomach (nauseous).  You throw up (vomit).  Your burning or discomfort with peeing does not go away.  You have a fever.  Your symptoms are not better in 3 days. MAKE SURE YOU:   Understand these instructions.  Will watch your condition.  Will get help right away if you are not doing well or get worse. Document Released: 07/14/2007 Document Revised: 10/20/2011 Document Reviewed: 08/26/2011 [facility] Patient Information 2015 [place], [place]. This information is not intended to replace advice given to you by your health care provider. Make sure you discuss any questions you have with your health care provider.

## 2014-10-21 NOTE — ED Provider Notes (Signed)
[facility] Emergency Department Provider Note  ____________________________________________  Time seen: Approximately 615 PM  I have reviewed the triage vital signs and the nursing notes.   HISTORY  Chief Complaint Abdominal Pain    HPI Melissa Day is a 44 y.o. female with a history of CVA with residual right-sided weakness was presenting today with intermittent abdominal pain as well as diarrhea and frank blood in her urine. She has had 2 episodes of this abdominal pain. The first was on this past Saturday. She will came to be seen at the emergency department but left without being seen after a prolonged wait. She had another episode today. Both episodes were in the right lower quadrant lasted about one hour. She has had nausea but no vomiting. Also reporting burning with urination. Was last treated for urinary tract infection in early August when she was given 7 days of Cipro. Over the past week she has also had foul-smelling stool which has appeared dark. She has had 3-4 episodes per day which has been diarrhea. Reports only minimal pain at this time.   Past Medical History  Diagnosis Date  . H/O: CVA (cerebrovascular accident) 2012    a. cerebral edema, craniotomy, residual right upper & lower limb weakness  . Hx of seizure disorder     a. one episode 10 months after CVA, since then on Keppra   . History of echocardiogram     a. 12/01/2013: EF 50-55%, nl global LV systolic function, mild MR, mildly increased LV posterior wall thickness  . History of stress test     a. 12/01/2013: no significant ischemia, no significant WMA, no EKG changes concerning for ischemia, EF 67%, low risk scan  . Heart murmur   . Thyroid disease   . Stroke   . High cholesterol     Patient Active Problem List   Diagnosis Date Noted  . Atypical chest pain 08/21/2014  . Malaise and fatigue 08/21/2014  . Morbid obesity 08/21/2014  . H/O: CVA (cerebrovascular accident)   .  Hx of seizure disorder   . History of echocardiogram   . History of stress test   . Heart murmur   . Thyroid disease   . Clinical depression 12/12/2013  . Vitamin D deficiency 12/12/2013  . 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 Brain surgery      Current Outpatient Rx  Name  Route  Sig  Dispense  Refill  . aspirin 81 MG tablet   Oral   Take 81 mg by mouth daily.         Marland Kitchen atorvastatin (LIPITOR) 40 MG tablet   Oral   Take 1 tablet (40 mg total) by mouth daily.   30 tablet   3   . DOCOSAHEXAENOIC ACID PO               . ferrous sulfate 325 (65 FE) MG tablet   Oral   Take 325 mg by mouth daily with breakfast.         . FLUoxetine (PROZAC) 40 MG capsule   Oral   Take 40 mg by mouth daily.         Marland Kitchen levETIRAcetam (KEPPRA) 750 MG tablet   Oral   Take 1 tablet (750 mg total) by mouth 2 (two) times daily.   28 tablet   0   . levothyroxine (SYNTHROID, LEVOTHROID) 100 MCG tablet  Oral   Take 100 mcg by mouth daily before breakfast.         . vitamin C (ASCORBIC ACID) 500 MG tablet   Oral   Take 500 mg by mouth daily.           Allergies Review of patient's allergies indicates no known allergies.  Family History  Problem Relation Age of Onset  . Heart disease Father   . CVA Father   . Nephrolithiasis Mother   . Nephrolithiasis Maternal Grandfather     Social History Social History  Substance Use Topics  . Smoking status: Former Research scientist (life sciences)  . Smokeless tobacco: Never Used  . Alcohol Use: No    Review of Systems Constitutional: No fever/chills Eyes: No visual changes. ENT: No sore throat. Cardiovascular: Denies chest pain. Respiratory: Denies shortness of breath. Gastrointestinal:  no vomiting. No constipation. Genitourinary: As above  Musculoskeletal: Negative for back pain. Skin: Negative for rash. Neurological: Negative for headaches, focal weakness or  numbness.  10-point ROS otherwise negative.  ____________________________________________   PHYSICAL EXAM:  VITAL SIGNS: ED Triage Vitals  Enc Vitals Group     BP 10/21/14 1711 135/81 mmHg     Pulse Rate 10/21/14 1711 79     Resp 10/21/14 1711 20     Temp 10/21/14 1711 97.8 F (36.6 C)     Temp Source 10/21/14 1711 Oral     SpO2 10/21/14 1711 98 %     Weight 10/21/14 1711 212 lb (96.163 kg)     Height 10/21/14 1711 5\' 4"  (1.626 m)     Head Cir --      Peak Flow --      Pain Score 10/21/14 1712 5     Pain Loc --      Pain Edu? --      Excl. in [place]? --     Constitutional: Alert and oriented. Well appearing and in no acute distress. Eyes: Conjunctivae are normal. PERRL. EOMI. Head: Atraumatic. Nose: No congestion/rhinnorhea. Mouth/Throat: Mucous membranes are moist.  Oropharynx non-erythematous. Neck: No stridor.   Cardiovascular: Normal rate, regular rhythm. Grossly normal heart sounds.  Good peripheral circulation. Respiratory: Normal respiratory effort.  No retractions. Lungs CTAB. Gastrointestinal: Soft with mild right lower quadrant pain without any rebound or guarding. No distention. No abdominal bruits. No CVA tenderness. Rectal exam with brown stool which is heme-negative. Musculoskeletal: No lower extremity tenderness nor edema.  No joint effusions. Neurologic:  Normal speech and language. 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 Skin:  Skin is warm, dry and intact. No rash noted. Psychiatric: Mood and affect are normal. Speech and behavior are normal.  ____________________________________________   LABS (all labs ordered are listed, but only abnormal results are displayed)  Labs Reviewed  URINALYSIS COMPLETEWITH MICROSCOPIC ([place]) - Abnormal; Notable for the following:    Color, Urine YELLOW (*)    APPearance CLOUDY (*)    Hgb urine dipstick 3+ (*)    Protein, ur 30 (*)    Leukocytes, UA 3+ (*)    Bacteria, UA RARE (*)    Squamous  Epithelial / LPF 6-30 (*)    All other components within normal limits  URINE CULTURE  C DIFFICILE QUICK SCREEN W PCR REFLEX  LIPASE, BLOOD  COMPREHENSIVE METABOLIC PANEL  CBC  PREGNANCY, URINE  CBC WITH DIFFERENTIAL/PLATELET  POCT PREGNANCY, URINE   ____________________________________________  EKG   ____________________________________________  RADIOLOGY  CAT scan of the abdomen and pelvis with a 5 mm stone over  the distal right ureter 3-4 cm above the UVJ. Mild prominence of the appendiceal tip. Likely normal because no adjacent free fluid or inflammatory changes. ____________________________________________   PROCEDURES    ____________________________________________   INITIAL IMPRESSION / ASSESSMENT AND PLAN / ED COURSE  Pertinent labs & imaging results that were available during my care of the patient were reviewed by me and considered in my medical decision making (see chart for details).  ----------------------------------------- 10:23 PM on 10/21/2014 -----------------------------------------  Patient is resting comfortably at this time. Evaluated by Dr. Rexene Edison of the surgical service. Does not think that the patient has acute appendicitis. However, does recommend Augmentin as an outpatient to cover any intra-abdominal pathogens. Also discussed with Dr. Noah Delaine of the urologic service. Recommends trial of passage for the ureteral stone. The due to patient's white count being normal and the urinalysis being nitrite negative without very many bacteria and also with a dirty sample with the patient should be able to be safely discharged for trial of passage at home. Dr. Noah Delaine Also reviewed the patient's lab results from 2 days ago.  Patient continues to rest, fluids without any distress. No fever at home or in the emergency department. We will discharge with Augmentin which should cover the urinary tract infection as well as intra-abdominal pathogens. Has not  had any diarrhea in the emergency department. Unlikely to be C. difficile because of this prolonged period without any stooling. Has a follow-up with her gastroenterologist in the morning. We'll also discharge with Percocet and a urine strainer. Patient family aware of return precautions including fever, increased nausea vomiting and pain.  ____________________________________________   FINAL CLINICAL IMPRESSION(S) / ED DIAGNOSES  Acute Right lower quadrant abdominal pain. Acute ureterolithiasis. Acute urinary tract infection. Initial visit.    Orbie Pyo, MD 10/21/14 2232

## 2014-10-22 ENCOUNTER — Telehealth: Payer: Self-pay | Admitting: Urology

## 2014-10-22 ENCOUNTER — Telehealth: Payer: Self-pay

## 2014-10-22 DIAGNOSIS — N200 Calculus of kidney: Secondary | ICD-10-CM

## 2014-10-22 MED ORDER — TAMSULOSIN HCL 0.4 MG PO CAPS: 0.4000 mg | ORAL_CAPSULE | Freq: Every day | ORAL | Status: DC

## 2014-10-22 NOTE — Telephone Encounter (Signed)
LMOM

## 2014-10-22 NOTE — Telephone Encounter (Signed)
Patient was seen in the office on 10/21/14 and you ordered a CT scan.  She went to the ER last night and they performed the CT that showed a 27mm stone.  Please advise as to next steps.

## 2014-10-22 NOTE — Telephone Encounter (Signed)
-----   Message from Festus Aloe, MD sent at 10/22/2014  9:59 AM EDT ----- This patient has a 5 mm distal stone. Have Ria Comment or Larene Beach start her on tamsulosin and see her back in the office in a week or two with a KUB. Stone has a good chance of passing.

## 2014-10-23 ENCOUNTER — Other Ambulatory Visit: Payer: Self-pay

## 2014-10-23 DIAGNOSIS — N200 Calculus of kidney: Secondary | ICD-10-CM

## 2014-10-23 LAB — URINE CULTURE

## 2014-10-23 NOTE — Telephone Encounter (Signed)
Pt has a cysto appt in Oct. Call sister back and make her aware KUB can be the day of cysto appt.

## 2014-10-25 NOTE — Telephone Encounter (Signed)
LMOM

## 2014-10-28 ENCOUNTER — Ambulatory Visit
Admission: RE | Admit: 2014-10-28 | Discharge: 2014-10-28 | Disposition: A | Discharge status: Home / Self Care | Payer: Commercial Managed Care - HMO | Source: Ambulatory Visit | Attending: Urology | Admitting: Urology

## 2014-10-28 DIAGNOSIS — N200 Calculus of kidney: Secondary | ICD-10-CM | POA: Diagnosis not present

## 2014-10-28 NOTE — Telephone Encounter (Signed)
Pt sister stopped by office today. Made aware to keep cysto appt and KUB results would be given then. Olin Hauser voiced understanding.

## 2014-10-29 ENCOUNTER — Encounter
Admission: EM | Disposition: A | Discharge status: Home / Self Care | Payer: Self-pay | Source: Home / Self Care | Attending: Emergency Medicine

## 2014-10-29 ENCOUNTER — Observation Stay
Admission: EM | Admit: 2014-10-29 | Discharge: 2014-10-30 | Disposition: A | Discharge status: Home / Self Care | Payer: Commercial Managed Care - HMO | Attending: Urology | Admitting: Urology

## 2014-10-29 ENCOUNTER — Emergency Department: Payer: Commercial Managed Care - HMO | Admitting: Anesthesiology

## 2014-10-29 ENCOUNTER — Emergency Department: Payer: Commercial Managed Care - HMO

## 2014-10-29 ENCOUNTER — Telehealth: Payer: Self-pay

## 2014-10-29 ENCOUNTER — Encounter: Payer: Self-pay | Admitting: Emergency Medicine

## 2014-10-29 DIAGNOSIS — Z87891 Personal history of nicotine dependence: Secondary | ICD-10-CM | POA: Insufficient documentation

## 2014-10-29 DIAGNOSIS — E780 Pure hypercholesterolemia: Secondary | ICD-10-CM | POA: Diagnosis not present

## 2014-10-29 DIAGNOSIS — R0789 Other chest pain: Secondary | ICD-10-CM | POA: Insufficient documentation

## 2014-10-29 DIAGNOSIS — E559 Vitamin D deficiency, unspecified: Secondary | ICD-10-CM | POA: Diagnosis not present

## 2014-10-29 DIAGNOSIS — Z8249 Family history of ischemic heart disease and other diseases of the circulatory system: Secondary | ICD-10-CM | POA: Insufficient documentation

## 2014-10-29 DIAGNOSIS — E079 Disorder of thyroid, unspecified: Secondary | ICD-10-CM | POA: Diagnosis not present

## 2014-10-29 DIAGNOSIS — I69959 Hemiplegia and hemiparesis following unspecified cerebrovascular disease affecting unspecified side: Secondary | ICD-10-CM | POA: Insufficient documentation

## 2014-10-29 DIAGNOSIS — Z823 Family history of stroke: Secondary | ICD-10-CM | POA: Diagnosis not present

## 2014-10-29 DIAGNOSIS — Z841 Family history of disorders of kidney and ureter: Secondary | ICD-10-CM | POA: Insufficient documentation

## 2014-10-29 DIAGNOSIS — G40909 Epilepsy, unspecified, not intractable, without status epilepticus: Secondary | ICD-10-CM | POA: Insufficient documentation

## 2014-10-29 DIAGNOSIS — R5383 Other fatigue: Secondary | ICD-10-CM | POA: Diagnosis not present

## 2014-10-29 DIAGNOSIS — N201 Calculus of ureter: Secondary | ICD-10-CM | POA: Diagnosis present

## 2014-10-29 DIAGNOSIS — Z6836 Body mass index (BMI) 36.0-36.9, adult: Secondary | ICD-10-CM | POA: Diagnosis not present

## 2014-10-29 DIAGNOSIS — R112 Nausea with vomiting, unspecified: Secondary | ICD-10-CM | POA: Diagnosis not present

## 2014-10-29 DIAGNOSIS — F329 Major depressive disorder, single episode, unspecified: Secondary | ICD-10-CM | POA: Diagnosis not present

## 2014-10-29 DIAGNOSIS — N132 Hydronephrosis with renal and ureteral calculous obstruction: Principal | ICD-10-CM | POA: Insufficient documentation

## 2014-10-29 DIAGNOSIS — R1031 Right lower quadrant pain: Secondary | ICD-10-CM | POA: Diagnosis not present

## 2014-10-29 DIAGNOSIS — N200 Calculus of kidney: Secondary | ICD-10-CM | POA: Diagnosis present

## 2014-10-29 HISTORY — PX: CYSTOSCOPY/RETROGRADE/URETEROSCOPY/STONE EXTRACTION WITH BASKET: SHX5317

## 2014-10-29 LAB — URINALYSIS COMPLETE WITH MICROSCOPIC (ARMC ONLY)
Bilirubin Urine: NEGATIVE
GLUCOSE, UA: NEGATIVE mg/dL
Leukocytes, UA: NEGATIVE
NITRITE: NEGATIVE
Protein, ur: NEGATIVE mg/dL
SPECIFIC GRAVITY, URINE: 1.028 (ref 1.005–1.030)
pH: 5 (ref 5.0–8.0)

## 2014-10-29 LAB — COMPREHENSIVE METABOLIC PANEL
ALK PHOS: 76 U/L (ref 38–126)
ALT: 29 U/L (ref 14–54)
AST: 33 U/L (ref 15–41)
Albumin: 4 g/dL (ref 3.5–5.0)
Anion gap: 11 (ref 5–15)
BILIRUBIN TOTAL: 0.8 mg/dL (ref 0.3–1.2)
BUN: 10 mg/dL (ref 6–20)
CALCIUM: 8.7 mg/dL — AB (ref 8.9–10.3)
CO2: 22 mmol/L (ref 22–32)
CREATININE: 0.91 mg/dL (ref 0.44–1.00)
Chloride: 106 mmol/L (ref 101–111)
Glucose, Bld: 135 mg/dL — ABNORMAL HIGH (ref 65–99)
Potassium: 3.4 mmol/L — ABNORMAL LOW (ref 3.5–5.1)
Sodium: 139 mmol/L (ref 135–145)
Total Protein: 6.8 g/dL (ref 6.5–8.1)

## 2014-10-29 LAB — CBC WITH DIFFERENTIAL/PLATELET
Basophils Absolute: 0.1 10*3/uL (ref 0–0.1)
Basophils Relative: 0 %
EOS PCT: 0 %
Eosinophils Absolute: 0 10*3/uL (ref 0–0.7)
HCT: 38.7 % (ref 35.0–47.0)
HEMOGLOBIN: 13.3 g/dL (ref 12.0–16.0)
LYMPHS ABS: 1.1 10*3/uL (ref 1.0–3.6)
LYMPHS PCT: 6 %
MCH: 30.1 pg (ref 26.0–34.0)
MCHC: 34.3 g/dL (ref 32.0–36.0)
MCV: 87.8 fL (ref 80.0–100.0)
Monocytes Absolute: 0.8 10*3/uL (ref 0.2–0.9)
Monocytes Relative: 5 %
Neutro Abs: 15.7 10*3/uL — ABNORMAL HIGH (ref 1.4–6.5)
Neutrophils Relative %: 89 %
Platelets: 226 10*3/uL (ref 150–440)
RBC: 4.41 MIL/uL (ref 3.80–5.20)
RDW: 13.6 % (ref 11.5–14.5)
WBC: 17.8 10*3/uL — AB (ref 3.6–11.0)

## 2014-10-29 LAB — PREGNANCY, URINE: PREG TEST UR: NEGATIVE

## 2014-10-29 LAB — LIPASE, BLOOD: LIPASE: 19 U/L — AB (ref 22–51)

## 2014-10-29 SURGERY — CYSTOSCOPY, WITH CALCULUS REMOVAL USING BASKET
Anesthesia: General | Laterality: Right | Wound class: Clean Contaminated

## 2014-10-29 MED ORDER — ENOXAPARIN SODIUM 40 MG/0.4ML ~~LOC~~ SOLN
40.0000 mg | SUBCUTANEOUS | Status: DC
  Administered 2014-10-30: 08:00:00 40 mg via SUBCUTANEOUS
  Filled 2014-10-29: qty 0.4

## 2014-10-29 MED ORDER — OXYCODONE-ACETAMINOPHEN 5-325 MG PO TABS: 1.0000 | ORAL_TABLET | Freq: Four times a day (QID) | ORAL | Status: DC | PRN

## 2014-10-29 MED ORDER — LEVOTHYROXINE SODIUM 100 MCG PO TABS
100.0000 ug | ORAL_TABLET | Freq: Every day | ORAL | Status: DC
  Administered 2014-10-30: 100 ug via ORAL
  Filled 2014-10-29: qty 1

## 2014-10-29 MED ORDER — CIPROFLOXACIN IN D5W 400 MG/200ML IV SOLN
400.0000 mg | Freq: Once | INTRAVENOUS | Status: AC
  Administered 2014-10-29: 400 mg via INTRAVENOUS
  Filled 2014-10-29: qty 200

## 2014-10-29 MED ORDER — FLUOXETINE HCL 20 MG PO CAPS
40.0000 mg | ORAL_CAPSULE | Freq: Every day | ORAL | Status: DC
  Administered 2014-10-30: 08:00:00 40 mg via ORAL
  Filled 2014-10-29: qty 2

## 2014-10-29 MED ORDER — MIDAZOLAM HCL 2 MG/2ML IJ SOLN
INTRAMUSCULAR | Status: DC | PRN
  Administered 2014-10-29: 2 mg via INTRAVENOUS

## 2014-10-29 MED ORDER — FERROUS SULFATE 325 (65 FE) MG PO TABS
325.0000 mg | ORAL_TABLET | Freq: Every day | ORAL | Status: DC
  Administered 2014-10-30: 325 mg via ORAL
  Filled 2014-10-29: qty 1

## 2014-10-29 MED ORDER — DOCUSATE SODIUM 100 MG PO CAPS
100.0000 mg | ORAL_CAPSULE | Freq: Two times a day (BID) | ORAL | Status: DC
  Administered 2014-10-30: 08:00:00 100 mg via ORAL
  Filled 2014-10-29: qty 1

## 2014-10-29 MED ORDER — LEVETIRACETAM 750 MG PO TABS
750.0000 mg | ORAL_TABLET | Freq: Two times a day (BID) | ORAL | Status: DC
  Administered 2014-10-30: 750 mg via ORAL
  Filled 2014-10-29 (×4): qty 1

## 2014-10-29 MED ORDER — LIDOCAINE HCL (CARDIAC) 20 MG/ML IV SOLN
INTRAVENOUS | Status: DC | PRN
  Administered 2014-10-29: 50 mg via INTRAVENOUS

## 2014-10-29 MED ORDER — SODIUM CHLORIDE 0.9 % IV BOLUS (SEPSIS)
1000.0000 mL | Freq: Once | INTRAVENOUS | Status: AC
  Administered 2014-10-29: 1000 mL via INTRAVENOUS

## 2014-10-29 MED ORDER — OXYBUTYNIN CHLORIDE 5 MG PO TABS
5.0000 mg | ORAL_TABLET | Freq: Three times a day (TID) | ORAL | Status: DC | PRN
  Filled 2014-10-29: qty 1

## 2014-10-29 MED ORDER — MORPHINE SULFATE (PF) 4 MG/ML IV SOLN
4.0000 mg | Freq: Once | INTRAVENOUS | Status: AC
  Administered 2014-10-29: 4 mg via INTRAVENOUS
  Filled 2014-10-29: qty 1

## 2014-10-29 MED ORDER — ONDANSETRON HCL 4 MG/2ML IJ SOLN
4.0000 mg | INTRAMUSCULAR | Status: DC | PRN
  Administered 2014-10-29 (×2): 4 mg via INTRAVENOUS
  Filled 2014-10-29: qty 2

## 2014-10-29 MED ORDER — ONDANSETRON HCL 4 MG/2ML IJ SOLN: 4.0000 mg | INTRAMUSCULAR | Status: DC | PRN

## 2014-10-29 MED ORDER — KETOROLAC TROMETHAMINE 30 MG/ML IJ SOLN
30.0000 mg | Freq: Once | INTRAMUSCULAR | Status: AC
  Administered 2014-10-29: 30 mg via INTRAVENOUS
  Filled 2014-10-29: qty 1

## 2014-10-29 MED ORDER — PROMETHAZINE HCL 25 MG/ML IJ SOLN: 6.2500 mg | INTRAMUSCULAR | Status: DC | PRN

## 2014-10-29 MED ORDER — FENTANYL CITRATE (PF) 100 MCG/2ML IJ SOLN: 25.0000 ug | INTRAMUSCULAR | Status: DC | PRN

## 2014-10-29 MED ORDER — LACTATED RINGERS IV SOLN: INTRAVENOUS | Status: DC

## 2014-10-29 MED ORDER — HYDROMORPHONE HCL 1 MG/ML IJ SOLN
0.5000 mg | INTRAMUSCULAR | Status: DC | PRN
  Administered 2014-10-29: 1 mg via INTRAVENOUS
  Filled 2014-10-29: qty 1

## 2014-10-29 MED ORDER — FENTANYL CITRATE (PF) 100 MCG/2ML IJ SOLN
INTRAMUSCULAR | Status: DC | PRN
  Administered 2014-10-29: 50 ug via INTRAVENOUS

## 2014-10-29 MED ORDER — ATORVASTATIN CALCIUM 20 MG PO TABS
40.0000 mg | ORAL_TABLET | Freq: Every day | ORAL | Status: DC
  Administered 2014-10-30: 08:00:00 40 mg via ORAL
  Filled 2014-10-29: qty 2

## 2014-10-29 MED ORDER — SODIUM CHLORIDE 0.45 % IV SOLN
INTRAVENOUS | Status: DC
  Administered 2014-10-30: via INTRAVENOUS

## 2014-10-29 MED ORDER — PROPOFOL 10 MG/ML IV BOLUS
INTRAVENOUS | Status: DC | PRN
  Administered 2014-10-29: 200 mg via INTRAVENOUS

## 2014-10-29 MED ORDER — SODIUM CHLORIDE 0.9 % IV SOLN
INTRAVENOUS | Status: DC | PRN
  Administered 2014-10-29: 22:00:00 via INTRAVENOUS

## 2014-10-29 SURGICAL SUPPLY — 33 items
ADAPTER CATH URET PLST 4-6FR (CATHETERS) IMPLANT
BAG DRAIN CYSTO-URO LG1000N (MISCELLANEOUS) ×3 IMPLANT
BASKET LASER NITINOL 1.9FR (BASKET) IMPLANT
BASKET NITINOL 4 WIRE 16 (BASKET) ×2 IMPLANT
BASKET NITINOL 4 WIRE 16MM (BASKET) ×1
BASKET STNLS GEMINI 4WIRE 3FR (BASKET) IMPLANT
BASKET ZERO TIP NITINOL 2.4FR (BASKET) IMPLANT
CATH INTERMIT  6FR 70CM (CATHETERS) IMPLANT
CATH URET 5FR 28IN CONE TIP (BALLOONS)
CATH URET 5FR 28IN OPEN ENDED (CATHETERS) IMPLANT
CATH URET 5FR 70CM CONE TIP (BALLOONS) IMPLANT
CATH URET DUAL LUMEN 6-10FR 50 (CATHETERS) IMPLANT
CATH URETL OPEN END 6X70 (CATHETERS) ×3 IMPLANT
CNTNR SPEC 2.5X3XGRAD LEK (MISCELLANEOUS) ×1
CONT SPEC 4OZ STER OR WHT (MISCELLANEOUS) ×2
CONTAINER SPEC 2.5X3XGRAD LEK (MISCELLANEOUS) ×1 IMPLANT
GLOVE BIO SURGEON STRL SZ7.5 (GLOVE) ×3 IMPLANT
GOWN STRL REUS W/ TWL LRG LVL3 (GOWN DISPOSABLE) ×2 IMPLANT
GOWN STRL REUS W/TWL LRG LVL3 (GOWN DISPOSABLE) ×4
GUIDEWIRE 0.038 PTFE COATED (WIRE) ×3 IMPLANT
GUIDEWIRE ANG ZIPWIRE 038X150 (WIRE) IMPLANT
GUIDEWIRE STR DUAL SENSOR (WIRE) IMPLANT
IV NS IRRIG 3000ML ARTHROMATIC (IV SOLUTION) IMPLANT
PACK CYSTO (CUSTOM PROCEDURE TRAY) ×3 IMPLANT
SENSORWIRE 0.038 NOT ANGLED (WIRE) ×3
SET CYSTO W/LG BORE CLAMP LF (SET/KITS/TRAYS/PACK) ×3 IMPLANT
SHEATH ACCESS URETERAL 38CM (SHEATH) IMPLANT
SHEATH URETERAL 12FRX35CM (MISCELLANEOUS) ×3 IMPLANT
SOL .9 NS 3000ML IRR  AL (IV SOLUTION) ×2
SOL .9 NS 3000ML IRR UROMATIC (IV SOLUTION) ×1 IMPLANT
STENT URETL SOFT 6X24 (Stent) ×3 IMPLANT
SYRINGE IRR TOOMEY STRL 70CC (SYRINGE) IMPLANT
WIRE SENSOR 0.038 NOT ANGLED (WIRE) ×1 IMPLANT

## 2014-10-29 NOTE — Anesthesia Preprocedure Evaluation (Signed)
Anesthesia Evaluation  Patient identified by MRN, date of birth, ID band Patient awake    Reviewed: Allergy & Precautions, H&P , NPO status , Patient's Chart, lab work & pertinent test results, reviewed documented beta blocker date and time   History of Anesthesia Complications Negative for: history of anesthetic complications  Airway Mallampati: III  TM Distance: >3 FB Neck ROM: full    Dental no notable dental hx. (+) Teeth Intact   Pulmonary neg pulmonary ROS, former smoker,    Pulmonary exam normal breath sounds clear to auscultation       Cardiovascular Exercise Tolerance: Good (-) hypertension(-) angina(-) CAD, (-) Past MI, (-) Cardiac Stents and (-) CABG Normal cardiovascular exam(-) dysrhythmias + Valvular Problems/Murmurs  Rhythm:regular Rate:Normal     Neuro/Psych Seizures -, Well Controlled,  PSYCHIATRIC DISORDERS (depression) CVA, Residual Symptoms    GI/Hepatic negative GI ROS, Neg liver ROS,   Endo/Other  neg diabetesHypothyroidism   Renal/GU negative Renal ROS  negative genitourinary   Musculoskeletal   Abdominal   Peds  Hematology negative hematology ROS (+)   Anesthesia Other Findings Past Medical History:   H/O: CVA (cerebrovascular accident)             2012           Comment:a. cerebral edema, craniotomy, residual right               upper & lower limb weakness   Hx of seizure disorder                                         Comment:a. one episode 10 months after CVA, since then               on Keppra    History of echocardiogram                                      Comment:a. 12/01/2013: EF 50-55%, nl global LV systolic              function, mild MR, mildly increased LV               posterior wall thickness   History of stress test                                         Comment:a. 12/01/2013: no significant ischemia, no               significant WMA, no EKG changes concerning for          ischemia, EF 67%, low risk scan   Heart murmur                                                 Thyroid disease                                              Stroke  High cholesterol                                             Reproductive/Obstetrics negative OB ROS                             Anesthesia Physical Anesthesia Plan  ASA: II  Anesthesia Plan: General   Post-op Pain Management:    Induction:   Airway Management Planned:   Additional Equipment:   Intra-op Plan:   Post-operative Plan:   Informed Consent: I have reviewed the patients History and Physical, chart, labs and discussed the procedure including the risks, benefits and alternatives for the proposed anesthesia with the patient or authorized representative who has indicated his/her understanding and acceptance.   Dental Advisory Given  Plan Discussed with: Anesthesiologist, CRNA and Surgeon  Anesthesia Plan Comments:         Anesthesia Quick Evaluation

## 2014-10-29 NOTE — Anesthesia Procedure Notes (Signed)
Procedure Name: LMA Insertion Date/Time: 10/29/2014 10:14 PM Performed by: Lendon Colonel Pre-anesthesia Checklist: Patient identified, Emergency Drugs available, Suction available, Patient being monitored and Timeout performed Patient Re-evaluated:Patient Re-evaluated prior to inductionOxygen Delivery Method: Circle system utilized Preoxygenation: Pre-oxygenation with 100% oxygen Intubation Type: IV induction LMA: LMA inserted LMA Size: 4.0 Number of attempts: 1 Placement Confirmation: positive ETCO2

## 2014-10-29 NOTE — ED Notes (Signed)
Urology at bedside evaluating patient

## 2014-10-29 NOTE — ED Provider Notes (Signed)
[facility] Emergency Department Provider Note  ____________________________________________  Time seen: Approximately 5:55 PM  I have reviewed the triage vital signs and the nursing notes.   HISTORY  Chief Complaint Flank Pain    HPI Graylyn Bunney Drollinger is a 44 y.o. female who was diagnosed with a 5 mm kidney stone on 912. Pain and nausea continue intermittently. Pain was worse again today. Did not have anything to eat or drink today because of pain and nausea. No fever. She was diagnosed with a possible urinary tract infection during her last visit, however, her cultures were negative. Patient is no longer taking the antibiotic. She has had some loose stools as a result of the antibiotic. She denies any fever or chills. No dysuria no urinary frequency. She does have hematuria. Given present since the end of August. 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  Past Medical History  Diagnosis Date  . H/O: CVA (cerebrovascular accident) 2012    a. cerebral edema, craniotomy, residual right upper & lower limb weakness  . Hx of seizure disorder     a. one episode 10 months after CVA, since then on Keppra   . History of echocardiogram     a. 12/01/2013: EF 50-55%, nl global LV systolic function, mild MR, mildly increased LV posterior wall thickness  . History of stress test     a. 12/01/2013: no significant ischemia, no significant WMA, no EKG changes concerning for ischemia, EF 67%, low risk scan  . Heart murmur   . Thyroid disease   . Stroke   . High cholesterol     Patient Active Problem List   Diagnosis Date Noted  . Atypical chest pain 08/21/2014  . Malaise and fatigue 08/21/2014  . Morbid obesity 08/21/2014  . H/O: CVA (cerebrovascular accident)   . Hx of seizure disorder   . History of echocardiogram   . History of stress test   . Heart murmur   . Thyroid disease   . Clinical depression 12/12/2013  . Vitamin D deficiency 12/12/2013  .  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 Brain surgery      Current Outpatient Rx  Name  Route  Sig  Dispense  Refill  . amoxicillin-clavulanate (AUGMENTIN) 875-125 MG per tablet   Oral   Take 1 tablet by mouth every 12 (twelve) hours.   20 tablet   0   . aspirin EC 81 MG tablet   Oral   Take 81 mg by mouth daily.         Marland Kitchen atorvastatin (LIPITOR) 40 MG tablet   Oral   Take 1 tablet (40 mg total) by mouth daily.   30 tablet   3   . Docosahexaenoic Acid (DHA OMEGA 3) 100 MG CAPS   Oral   Take 100 mg by mouth daily.         . ferrous sulfate 325 (65 FE) MG tablet   Oral   Take 325 mg by mouth daily.          Marland Kitchen FLUoxetine (PROZAC) 40 MG capsule   Oral   Take 40 mg by mouth daily.         Marland Kitchen levETIRAcetam (KEPPRA) 750 MG tablet   Oral   Take 1 tablet (750 mg total) by mouth 2 (two) times daily.   28 tablet   0   . levothyroxine (SYNTHROID, LEVOTHROID) 100 MCG  tablet   Oral   Take 100 mcg by mouth daily before breakfast.         . oxyCODONE-acetaminophen (ROXICET) 5-325 MG per tablet   Oral   Take 1-2 tablets by mouth every 6 (six) hours as needed. Patient taking differently: Take 1-2 tablets by mouth every 6 (six) hours as needed for severe pain.    15 tablet   0   . tamsulosin (FLOMAX) 0.4 MG CAPS capsule   Oral   Take 1 capsule (0.4 mg total) by mouth daily.   30 capsule   0   . tamsulosin (FLOMAX) 0.4 MG CAPS capsule   Oral   Take 1 capsule (0.4 mg total) by mouth daily. Patient not taking: Reported on 10/29/2014   30 capsule   0     Allergies Review of patient's allergies indicates no known allergies.  Family History  Problem Relation Age of Onset  . Heart disease Father   . CVA Father   . Nephrolithiasis Mother   . Nephrolithiasis Maternal Grandfather     Social History Social History  Substance Use Topics  . Smoking status: Former Research scientist (life sciences)  .  Smokeless tobacco: Never Used  . Alcohol Use: No    Review of Systems Constitutional: No fever/chills Eyes: No visual changes. ENT: No sore throat. Cardiovascular: Denies chest pain. Respiratory: Denies shortness of breath. Gastrointestinal: No abdominal pain.  She denies vomiting but has had lots of nausea .  No diarrhea.  No constipation. Genitourinary: Negative for dysuria. Musculoskeletal: Negative for back pain. Skin: Negative for rash. Neurological: Negative for headaches, focal weakness or numbness.  10-point ROS otherwise negative.  ____________________________________________   PHYSICAL EXAM:  VITAL SIGNS: ED Triage Vitals  Enc Vitals Group     BP 10/29/14 1515 137/75 mmHg     Pulse Rate 10/29/14 1515 84     Resp 10/29/14 1515 16     Temp 10/29/14 1515 97.4 F (36.3 C)     Temp Source 10/29/14 1515 Oral     SpO2 10/29/14 1515 100 %     Weight 10/29/14 1515 200 lb (90.719 kg)     Height 10/29/14 1515 5\' 4"  (1.626 m)     Head Cir --      Peak Flow --      Pain Score 10/29/14 1516 10     Pain Loc --      Pain Edu? --      Excl. in [place]? --     Constitutional: Alert and oriented. Well appearing and in no acute distress. Eyes: Conjunctivae are normal. PERRL. EOMI. Head: Atraumatic. Nose: No congestion/rhinnorhea. Mouth/Throat: Mucous membranes are moist.  Oropharynx non-erythematous. Neck: No stridor.   Cardiovascular: Normal rate, regular rhythm. Grossly normal heart sounds.  Good peripheral circulation. Respiratory: Normal respiratory effort.  No retractions. Lungs CTAB. Gastrointestinal: There is slight tenderness to the right side of the lower abdomen but no focal tenderness and no guarding and no rebound. No distention. No abdominal bruits. Right-sided CVA tenderness. Musculoskeletal: No lower extremity tenderness nor edema.  No joint effusions. Neurologic:  Residual right-sided weakness and difficulty speaking noted, baseline. Skin:  Skin is warm, dry and  intact. No rash noted. Psychiatric: Mood and affect are normal. Speech and behavior are normal.  ____________________________________________   LABS (all labs ordered are listed, but only abnormal results are displayed)  Labs Reviewed  URINALYSIS COMPLETEWITH MICROSCOPIC ([place] ONLY) - Abnormal; Notable for the following:    Color, Urine YELLOW (*)  APPearance HAZY (*)    Ketones, ur TRACE (*)    Hgb urine dipstick 3+ (*)    Bacteria, UA RARE (*)    Squamous Epithelial / LPF 0-5 (*)    All other components within normal limits  CBC WITH DIFFERENTIAL/PLATELET - Abnormal; Notable for the following:    WBC 17.8 (*)    Neutro Abs 15.7 (*)    All other components within normal limits  COMPREHENSIVE METABOLIC PANEL - Abnormal; Notable for the following:    Potassium 3.4 (*)    Glucose, Bld 135 (*)    Calcium 8.7 (*)    All other components within normal limits  LIPASE, BLOOD - Abnormal; Notable for the following:    Lipase 19 (*)    All other components within normal limits  PREGNANCY, URINE   ____________________________________________  EKG   ____________________________________________  RADIOLOGY I have reviewed x-rays From yesterday ____________________________________________   PROCEDURES  Procedure(s) performed: None  Critical Care performed: None  ____________________________________________   INITIAL IMPRESSION / ASSESSMENT AND PLAN / ED COURSE  Pertinent labs & imaging results that were available during my care of the patient were reviewed by me and considered in my medical decision making (see chart for details).  Yesterday KUB still showed likely kidney stone patient saw his hematuria no evidence of infection urine culture has been repeated. White count is elevated the patient is under stress. Discussed with Dr. Dorina Hoyer, who will come and evaluate the patient and agrees with management. Initially, patient's pain was well controlled but it has since  resurged. I have given her morphine and we are not trying Toradol. IV fluids have been given and we will continue to assess. Patient has not vomited here. ____________________________________________   FINAL CLINICAL IMPRESSION(S) / ED DIAGNOSES  Final diagnoses:  Kidney stone     Schuyler Amor, MD 10/29/14 1758

## 2014-10-29 NOTE — H&P (Signed)
H&P  Chief Complaint: Kidney stone  History of Present Illness: Melissa Day is a 44 y.o. year old female who presents to the emergency room here at [facility] for the second time over the past 8 days with symptoms associated with a 6 mm right distal ureteral stone. She has no prior history of urolithiasis. She began having right lower quadrant and right flank symptoms in August, 2016. She is had persistent symptomatology, and presented on September 12 with exacerbation of her symptoms. This was associated with nausea and vomiting. CT scan was performed revealing a normal appendix, but a 6 mm right distal ureteral stone with mild hydronephrosis. The patient was thought to have a UTI and was sent home with antibiotic. Urine culture from that visit was significant only for mixed organisms, however. She also has had recent diarrhea and GI symptoms and was found to have H. pylori. That is being treated the present time. She was seen by Dr. Junious Silk in the clinic in [facility] urologic last week. She was sent home with medical expulsive therapy.  She has had worsening symptoms today. This is associated with nausea but no vomiting. Limited oral intake. No fever, chills. No left-sided flank or abdominal pain.  Past Medical History  Diagnosis Date  . H/O: CVA (cerebrovascular accident) 2012    a. cerebral edema, craniotomy, residual right upper & lower limb weakness  . Hx of seizure disorder     a. one episode 10 months after CVA, since then on Keppra   . History of echocardiogram     a. 12/01/2013: EF 50-55%, nl global LV systolic function, mild MR, mildly increased LV posterior wall thickness  . History of stress test     a. 12/01/2013: no significant ischemia, no significant WMA, no EKG changes concerning for ischemia, EF 67%, low risk scan  . Heart murmur   . Thyroid disease   . Stroke   . High cholesterol     Past Surgical History  Procedure Laterality Date  . Brain  surgery      Home Medications:   (Not in a hospital admission)  Allergies: No Known Allergies  Family History  Problem Relation Age of Onset  . Heart disease Father   . CVA Father   . Nephrolithiasis Mother   . Nephrolithiasis Maternal Grandfather     Social History:  reports that she has quit smoking. She has never used smokeless tobacco. She reports that she does not drink alcohol or use illicit drugs.  ROS: A complete review of systems was performed.  All systems are negative except for pertinent findings as noted.  Physical Exam:  Vital signs in last 24 hours: Temp:  [97.4 F (36.3 C)] 97.4 F (36.3 C) (09/20 1515) Pulse Rate:  [80-84] 80 (09/20 1730) Resp:  [16] 16 (09/20 1730) BP: (137-140)/(75-85) 140/85 mmHg (09/20 1730) SpO2:  [97 %-100 %] 98 % (09/20 1730) Weight:  [90.719 kg (200 lb)] 90.719 kg (200 lb) (09/20 1515) General:  Alert and oriented, No acute distress HEENT: Normocephalic, atraumatic Neck: No JVD or lymphadenopathy Cardiovascular: Regular rate and rhythm Lungs: Clear bilaterally Abdomen: Soft, mild to moderate right lower quadrant and right CVA tenderness. No rebound or guarding. Obese. Back: No CVA tenderness Extremities: No edema Neurologic: Signs of left hemispheric CVA.  Laboratory Data:  Results for orders placed or performed during the hospital encounter of 10/29/14 (from the past 24 hour(s))  Urinalysis complete, with microscopic- may I&O cath if menses [facility] only)  Status: Abnormal   Collection Time: 10/29/14  3:20 PM  Result Value Ref Range   Color, Urine YELLOW (A) YELLOW   APPearance HAZY (A) CLEAR   Glucose, UA NEGATIVE NEGATIVE mg/dL   Bilirubin Urine NEGATIVE NEGATIVE   Ketones, ur TRACE (A) NEGATIVE mg/dL   Specific Gravity, Urine 1.028 1.005 - 1.030   Hgb urine dipstick 3+ (A) NEGATIVE   pH 5.0 5.0 - 8.0   Protein, ur NEGATIVE NEGATIVE mg/dL   Nitrite NEGATIVE NEGATIVE   Leukocytes, UA NEGATIVE NEGATIVE   RBC / HPF  TOO NUMEROUS TO COUNT 0 - 5 RBC/hpf   WBC, UA 6-30 0 - 5 WBC/hpf   Bacteria, UA RARE (A) NONE SEEN   Squamous Epithelial / LPF 0-5 (A) NONE SEEN   Mucous PRESENT   Pregnancy, urine     Status: None   Collection Time: 10/29/14  3:24 PM  Result Value Ref Range   Preg Test, Ur NEGATIVE NEGATIVE  CBC with Differential     Status: Abnormal   Collection Time: 10/29/14  4:14 PM  Result Value Ref Range   WBC 17.8 (H) 3.6 - 11.0 K/uL   RBC 4.41 3.80 - 5.20 MIL/uL   Hemoglobin 13.3 12.0 - 16.0 g/dL   HCT 38.7 35.0 - 47.0 %   MCV 87.8 80.0 - 100.0 fL   MCH 30.1 26.0 - 34.0 pg   MCHC 34.3 32.0 - 36.0 g/dL   RDW 13.6 11.5 - 14.5 %   Platelets 226 150 - 440 K/uL   Neutrophils Relative % 89 %   Neutro Abs 15.7 (H) 1.4 - 6.5 K/uL   Lymphocytes Relative 6 %   Lymphs Abs 1.1 1.0 - 3.6 K/uL   Monocytes Relative 5 %   Monocytes Absolute 0.8 0.2 - 0.9 K/uL   Eosinophils Relative 0 %   Eosinophils Absolute 0.0 0 - 0.7 K/uL   Basophils Relative 0 %   Basophils Absolute 0.1 0 - 0.1 K/uL  Comprehensive metabolic panel     Status: Abnormal   Collection Time: 10/29/14  4:14 PM  Result Value Ref Range   Sodium 139 135 - 145 mmol/L   Potassium 3.4 (L) 3.5 - 5.1 mmol/L   Chloride 106 101 - 111 mmol/L   CO2 22 22 - 32 mmol/L   Glucose, Bld 135 (H) 65 - 99 mg/dL   BUN 10 6 - 20 mg/dL   Creatinine, Ser 0.91 0.44 - 1.00 mg/dL   Calcium 8.7 (L) 8.9 - 10.3 mg/dL   Total Protein 6.8 6.5 - 8.1 g/dL   Albumin 4.0 3.5 - 5.0 g/dL   AST 33 15 - 41 U/L   ALT 29 14 - 54 U/L   Alkaline Phosphatase 76 38 - 126 U/L   Total Bilirubin 0.8 0.3 - 1.2 mg/dL   GFR calc non Af Amer >60 >60 mL/min   GFR calc Af Amer >60 >60 mL/min   Anion gap 11 5 - 15  Lipase, blood     Status: Abnormal   Collection Time: 10/29/14  4:14 PM  Result Value Ref Range   Lipase 19 (L) 22 - 51 U/L   Recent Results (from the past 240 hour(s))  Urine culture     Status: None   Collection Time: 10/21/14  6:17 PM  Result Value Ref Range  Status   Specimen Description URINE, CLEAN CATCH  Final   Special Requests NONE  Final   Culture MULTIPLE SPECIES PRESENT, SUGGEST RECOLLECTION  Final  Report Status 10/23/2014 FINAL  Final   Creatinine:  Recent Labs  10/29/14 1614  CREATININE 0.91    Radiologic Imaging: Abdomen 1 View (kub)  10/28/2014   CLINICAL DATA:  Kidney stones.  EXAM: ABDOMEN - 1 VIEW  COMPARISON:  CT 10/21/2014.  FINDINGS: Soft tissue structures are unremarkable. No bowel distention. No free air. Questionable calcific density in the right lower pelvis noted. This could represent previously identified distal right ureteral stone. Tiny calcific density in the lower left pelvis most likely phleboliths. No acute bony abnormality .  IMPRESSION: Questionable tiny calcific density in the right lower pelvis noted. This could represent previously identified distal right ureteral stone.   Electronically Signed   By: Marcello Moores  Register   On: 10/28/2014 09:07   CT scan images from 10/21/2014 were reviewed, both personally and with the patient's family. There is a right distal ureteral stone. From that CT, there is mild hydronephrosis. No other urolithiasis is noted. Impression/Assessment:  Right distal ureteral stone with prolonged/worsening symptomatology. At this point, she has failed outpatient therapy.  Plan:  1. Admit for hydration/pain management  2. I've discussed cystoscopy, right retrograde ureteropyelogram, attempted right ureteroscopy with stone extraction him a and at that is unsuccessful, stent placement followed by second line therapy at a later date-lithotripsy or, more likely, ureteroscopy with holmium laser.  Jorja Loa 10/29/2014, 7:06 PM  Lillette Boxer. Dahlstedt MD

## 2014-10-29 NOTE — ED Notes (Signed)
Dr. Burlene Arnt aware of patient's pain.  Dr. Devra Dopp has already spoken with Urology who will come to ED to assess patient.  Patient and family informed.  Understanding verbalized.  Continue to monitor.

## 2014-10-29 NOTE — Transfer of Care (Signed)
Immediate Anesthesia Transfer of Care Note  Patient: Melissa Day  Procedure(s) Performed: Procedure(s): CYSTOSCOPY/RETROGRADE/URETEROSCOPY/STONE EXTRACTION WITH BASKET (Right)  Patient Location: PACU  Anesthesia Type:General  Level of Consciousness: awake, alert , oriented and patient cooperative  Airway & Oxygen Therapy: Patient Spontanous Breathing and Patient connected to face mask oxygen  Post-op Assessment: Report given to RN and Post -op Vital signs reviewed and stable  Post vital signs: Reviewed and stable  Last Vitals:  Filed Vitals:   10/29/14 2112  BP:   Pulse: 89  Temp:   Resp:     Complications: No apparent anesthesia complications

## 2014-10-29 NOTE — ED Notes (Signed)
Pt seen here recently and dx with UTI and 68mm kidney stone on right side. Pt reports right flank pain that radiates into right side of abdomen. Pt is on augmentin for UTI. Pt with hx of stroke, affected right side. When pt was here on Monday, they discussed possible admission and sent home to see if pt could pass kidney stone by herself. Pt last dose of percocet was 1245.

## 2014-10-29 NOTE — Telephone Encounter (Signed)
Patient's mother, Petra Kuba, called and stated patient was in a lot of pain and did not sleep last night.  Requested a nurse to call them back.  [phone]

## 2014-10-29 NOTE — Telephone Encounter (Signed)
Magda Paganini, pt sister, called stating pt is doubled over in pain and crying. Magda Paganini states pt has taken 2 doses of percocet without any relief. Nurse advised sister to take pt to ER. Magda Paganini voiced understanding.

## 2014-10-29 NOTE — Op Note (Signed)
Preoperative diagnosis: 6 mm right distal ureteral stone with persistent pain/nausea and vomiting  Postoperative diagnosis: Same  Principal procedure: Cystoscopy, right retrograde ureteropyelogram with interpretive fluoroscopy, dilation of right distal ureter, right ureteroscopy with extraction of right ureteral stone, right double-J stent placement  Surgeon: Maddie Brazier  Anesthesia: Gen.  Complications: None  Drains: 24 cm x 6 French contour double-J stent and right ureter  Indications: 44 year old female with a right distal ureteral stone which is been symptomatic for several weeks. She is presented for medical attention 3 times in the past week, and presented to the emergency room today with worsening pain, nausea and urologic consultation was requested. Because of her persistent pain and symptoms she is admitted at this time and we have discussed urgent management of her stone. She presents at this time for cystoscopy, retrograde ureteropyelogram, attempted stone extraction, possible stent placement. Risks and complications of the procedure have been discussed. She understands these and desires to proceed.  Description of procedure: The patient was taken the operating room after proper identification. She received preoperative IV antibiotic. She was placed on the OR table. General anesthetic was administered. She was placed in the dorsolithotomy position. Genitalia and perineum were prepped and draped. Proper timeout was performed.  A 21 French panendoscope was placed in her bladder which was inspected circumferentially. There were no tumors, trabeculations or foreign bodies. Ureteral orifices were normal bilaterally. The right ureteral orifice was cannulated with a 6 [place] open-ended catheter. Gentle retrograde ureteropyelogram was performed. This revealed a filling defect in the very distal ureter with proximal hydroureteronephrosis. No other filling defects were noted.  A sensor-tip  guidewire was advanced through the open-ended catheter, into the upper pole calyces. The open-ended catheter was then removed. The cystoscope was removed. I then dilated the right distal ureter first with the inner core and then with the entire 12/14 ureteral access sheath. Dilation was performed for proximally 2 minutes. This was easily performed. I then removed the ureteral access sheath. I then passed a 6 French rigid ureteroscope through the urethra and then up into the right distal ureter. The stone was encountered in the mid ureter. It was grasped with a 0 tip basket and easily extracted. Passes through the distal ureter was quite easily, without trauma. There was some mild trauma from the dilation of the distal ureter, and for this reason I felt it necessary to place a double-J stent. The cystoscope was replaced over the guidewire, and a 24 cm x 6 French contour double-J stent was placed using fluoroscopic and cystoscopic guidance. Good proximal and distal curls were seen on the stent once a guidewire was removed. The bladder drained and the scope was removed. The patient tolerated procedure well was transferred to the PACU in stable condition.

## 2014-10-30 ENCOUNTER — Encounter: Payer: Self-pay | Admitting: Urology

## 2014-10-30 DIAGNOSIS — N132 Hydronephrosis with renal and ureteral calculous obstruction: Secondary | ICD-10-CM | POA: Diagnosis not present

## 2014-10-30 MED ORDER — OXYBUTYNIN CHLORIDE 5 MG PO TABS: 5.0000 mg | ORAL_TABLET | Freq: Three times a day (TID) | ORAL | Status: DC | PRN

## 2014-10-30 NOTE — Plan of Care (Signed)
Problem: Discharge Progression Outcomes Goal: Other Discharge Outcomes/Goals Outcome: Progressing Plan of care progress to goal: Pt came from PACU. S/p stent placement and stone removal. VSS. Up with assist to BR to void. Hx of CVA with right sided weakness. Rested well, no c/o pain. Mom at bedside. Pt has not eaten since admission since it was so late in the night. IVF at 54mls per hour. Admitted as observation.

## 2014-10-30 NOTE — Progress Notes (Addendum)
Pt discharged home per MD order. IV removed. Medicine information, activity, follow up care, diet and instructions given to patient and mom. All questions answered, pt verbalized understanding. Pt left via wheelchair with auxiliary and family.

## 2014-10-30 NOTE — Discharge Instructions (Signed)
Resume all home medications including Synthoid 112mg .  Take oxybutin only as needed for bladder spasms.  Dr. Vernie Shanks recommended that you stop taking Flomax.  He states that you told him you would prefer to follow up with him at [facility] in [place].  They will be contacting you to schedule a follow up appointment to have your stent removed.  If you do not hear from them within the next few days call them at #[phone].  You have a ureteral stent in place.  This is a tube that extends from your kidney to your bladder.  This may cause urinary bleeding, burning with urination, and urinary frequency.  Please call our office or present to the ED if you develop fevers >101 or pain which is not able to be controlled with oral pain medications.  You may be given either Flomax and/ or ditropan to help with bladder spasms and stent pain in addition to pain medications.    [place] [address] [place], [place] [address] [phone]  Kidney Stones Kidney stones (urolithiasis) are solid masses that form inside your kidneys. The intense pain is caused by the stone moving through the kidney, ureter, bladder, and urethra (urinary tract). When the stone moves, the ureter starts to spasm around the stone. The stone is usually passed in your pee (urine).  HOME CARE  Drink enough fluids to keep your pee clear or pale yellow. This helps to get the stone out.  Strain all pee through the provided strainer. Do not pee without peeing through the strainer, not even once. If you pee the stone out, catch it in the strainer. The stone may be as small as a grain of salt. Take this to your doctor. This will help your doctor figure out what you can do to try to prevent more kidney stones.  Only take medicine as told by your doctor.  Follow up with your doctor as told.  Get follow-up X-rays as told by your doctor. GET HELP IF: You have pain that gets worse even if you  have been taking pain medicine. GET HELP RIGHT AWAY IF:   Your pain does not get better with medicine.  You have a fever or shaking chills.  Your pain increases and gets worse over 18 hours.  You have new belly (abdominal) pain.  You feel faint or pass out.  You are unable to pee. MAKE SURE YOU:   Understand these instructions.  Will watch your condition.  Will get help right away if you are not doing well or get worse. Document Released: 07/14/2007 Document Revised: 09/27/2012 Document Reviewed: 06/28/2012 [facility] Patient Information 2015 [place], [place]. This information is not intended to replace advice given to you by your health care provider. Make sure you discuss any questions you have with your health care provider.

## 2014-10-30 NOTE — Anesthesia Postprocedure Evaluation (Signed)
  Anesthesia Post-op Note  Patient: Melissa Day  Procedure(s) Performed: Procedure(s): CYSTOSCOPY/URETEROSCOPY/STONE EXTRACTION WITH BASKET (Right)  Anesthesia type:General  Patient location: PACU  Post pain: Pain level controlled  Post assessment: Post-op Vital signs reviewed, Patient's Cardiovascular Status Stable, Respiratory Function Stable, Patent Airway and No signs of Nausea or vomiting  Post vital signs: Reviewed and stable  Last Vitals:  Filed Vitals:   10/30/14 0529  BP: 129/69  Pulse: 81  Temp: 37.3 C  Resp:     Level of consciousness: awake, alert  and patient cooperative  Complications: No apparent anesthesia complications

## 2014-10-30 NOTE — Discharge Summary (Signed)
Patient is a 44 year old female status post URS with basket extraction and stent placement. She is doing well postoperatively. She denies any pain, nausea or vomiting. No fevers. She will be discharged and scheduled for follow-up at [facility] for stent removal within the next 1-2 weeks.

## 2014-10-30 NOTE — Progress Notes (Signed)
Pharmacy consult for antibiotic renal dosing  Patient does not have any currently active antibiotic orders. Pharmacy will continue to follow.

## 2014-10-31 ENCOUNTER — Encounter: Payer: Self-pay | Admitting: Urology

## 2014-11-08 ENCOUNTER — Other Ambulatory Visit: Payer: Self-pay | Admitting: Obstetrics and Gynecology

## 2014-11-15 ENCOUNTER — Encounter: Payer: Self-pay | Admitting: *Deleted

## 2014-11-15 ENCOUNTER — Other Ambulatory Visit: Payer: Commercial Managed Care - HMO

## 2014-11-18 ENCOUNTER — Ambulatory Visit
Admission: RE | Admit: 2014-11-18 | Discharge: 2014-11-18 | Disposition: A | Discharge status: Home / Self Care | Payer: Commercial Managed Care - HMO | Source: Ambulatory Visit | Attending: Gastroenterology | Admitting: Gastroenterology

## 2014-11-18 ENCOUNTER — Encounter
Admission: RE | Disposition: A | Discharge status: Home / Self Care | Payer: Self-pay | Source: Ambulatory Visit | Attending: Gastroenterology

## 2014-11-18 ENCOUNTER — Ambulatory Visit: Payer: Commercial Managed Care - HMO | Admitting: Anesthesiology

## 2014-11-18 DIAGNOSIS — Z825 Family history of asthma and other chronic lower respiratory diseases: Secondary | ICD-10-CM | POA: Diagnosis not present

## 2014-11-18 DIAGNOSIS — E039 Hypothyroidism, unspecified: Secondary | ICD-10-CM | POA: Insufficient documentation

## 2014-11-18 DIAGNOSIS — Z79899 Other long term (current) drug therapy: Secondary | ICD-10-CM | POA: Diagnosis not present

## 2014-11-18 DIAGNOSIS — Z8249 Family history of ischemic heart disease and other diseases of the circulatory system: Secondary | ICD-10-CM | POA: Diagnosis not present

## 2014-11-18 DIAGNOSIS — I69351 Hemiplegia and hemiparesis following cerebral infarction affecting right dominant side: Secondary | ICD-10-CM | POA: Diagnosis not present

## 2014-11-18 DIAGNOSIS — K529 Noninfective gastroenteritis and colitis, unspecified: Secondary | ICD-10-CM | POA: Diagnosis not present

## 2014-11-18 DIAGNOSIS — Z8349 Family history of other endocrine, nutritional and metabolic diseases: Secondary | ICD-10-CM | POA: Insufficient documentation

## 2014-11-18 DIAGNOSIS — K921 Melena: Secondary | ICD-10-CM | POA: Diagnosis not present

## 2014-11-18 DIAGNOSIS — Z87891 Personal history of nicotine dependence: Secondary | ICD-10-CM | POA: Diagnosis not present

## 2014-11-18 DIAGNOSIS — F329 Major depressive disorder, single episode, unspecified: Secondary | ICD-10-CM | POA: Insufficient documentation

## 2014-11-18 DIAGNOSIS — E7800 Pure hypercholesterolemia, unspecified: Secondary | ICD-10-CM | POA: Diagnosis not present

## 2014-11-18 DIAGNOSIS — G40909 Epilepsy, unspecified, not intractable, without status epilepticus: Secondary | ICD-10-CM | POA: Diagnosis not present

## 2014-11-18 DIAGNOSIS — Z7982 Long term (current) use of aspirin: Secondary | ICD-10-CM | POA: Insufficient documentation

## 2014-11-18 DIAGNOSIS — Z9889 Other specified postprocedural states: Secondary | ICD-10-CM | POA: Diagnosis not present

## 2014-11-18 DIAGNOSIS — Z823 Family history of stroke: Secondary | ICD-10-CM | POA: Diagnosis not present

## 2014-11-18 DIAGNOSIS — R1031 Right lower quadrant pain: Secondary | ICD-10-CM | POA: Diagnosis present

## 2014-11-18 DIAGNOSIS — Z807 Family history of other malignant neoplasms of lymphoid, hematopoietic and related tissues: Secondary | ICD-10-CM | POA: Insufficient documentation

## 2014-11-18 HISTORY — DX: Hypothyroidism, unspecified: E03.9

## 2014-11-18 HISTORY — PX: COLONOSCOPY WITH PROPOFOL: SHX5780

## 2014-11-18 HISTORY — DX: Unspecified convulsions: R56.9

## 2014-11-18 HISTORY — DX: Anemia, unspecified: D64.9

## 2014-11-18 HISTORY — PX: ESOPHAGOGASTRODUODENOSCOPY (EGD) WITH PROPOFOL: SHX5813

## 2014-11-18 SURGERY — COLONOSCOPY WITH PROPOFOL
Anesthesia: General

## 2014-11-18 MED ORDER — SODIUM CHLORIDE 0.9 % IV SOLN
INTRAVENOUS | Status: DC
  Administered 2014-11-18: 1000 mL via INTRAVENOUS
  Administered 2014-11-18: 11:00:00 via INTRAVENOUS

## 2014-11-18 MED ORDER — FENTANYL CITRATE (PF) 100 MCG/2ML IJ SOLN
INTRAMUSCULAR | Status: DC | PRN
  Administered 2014-11-18: 25 ug via INTRAVENOUS
  Administered 2014-11-18: 50 ug via INTRAVENOUS
  Administered 2014-11-18: 25 ug via INTRAVENOUS

## 2014-11-18 MED ORDER — PROPOFOL 500 MG/50ML IV EMUL
INTRAVENOUS | Status: DC | PRN
  Administered 2014-11-18: 120 ug/kg/min via INTRAVENOUS

## 2014-11-18 MED ORDER — PROPOFOL 10 MG/ML IV BOLUS
INTRAVENOUS | Status: DC | PRN
  Administered 2014-11-18: 20 mg via INTRAVENOUS
  Administered 2014-11-18 (×3): 10 mg via INTRAVENOUS

## 2014-11-18 NOTE — Transfer of Care (Signed)
Immediate Anesthesia Transfer of Care Note  Patient: Melissa Day  Procedure(s) Performed: Procedure(s): COLONOSCOPY WITH PROPOFOL (N/A) ESOPHAGOGASTRODUODENOSCOPY (EGD) WITH PROPOFOL (N/A)  Patient Location: PACU  Anesthesia Type:General  Level of Consciousness: awake and alert   Airway & Oxygen Therapy: Patient Spontanous Breathing and Patient connected to nasal cannula oxygen  Post-op Assessment: Report given to RN and Post -op Vital signs reviewed and stable  Post vital signs: Reviewed and stable  Last Vitals:  Filed Vitals:   11/18/14 1133  BP: 126/71  Pulse: 70  Temp: 35.8 C  Resp: 15    Complications: No apparent anesthesia complications

## 2014-11-18 NOTE — Anesthesia Postprocedure Evaluation (Signed)
  Anesthesia Post-op Note  Patient: Melissa Day  Procedure(s) Performed: Procedure(s): COLONOSCOPY WITH PROPOFOL (N/A) ESOPHAGOGASTRODUODENOSCOPY (EGD) WITH PROPOFOL (N/A)  Anesthesia type:General  Patient location: PACU  Post pain: Pain level controlled  Post assessment: Post-op Vital signs reviewed, Patient's Cardiovascular Status Stable, Respiratory Function Stable, Patent Airway and No signs of Nausea or vomiting  Post vital signs: Reviewed and stable  Last Vitals:  Filed Vitals:   11/18/14 1133  BP: 126/71  Pulse: 70  Temp: 35.8 C  Resp: 15    Level of consciousness: awake, alert  and patient cooperative  Complications: No apparent anesthesia complications

## 2014-11-18 NOTE — Anesthesia Preprocedure Evaluation (Signed)
Anesthesia Evaluation  Patient identified by MRN, date of birth, ID band Patient awake    Reviewed: Allergy & Precautions, H&P , NPO status , Patient's Chart, lab work & pertinent test results, reviewed documented beta blocker date and time   History of Anesthesia Complications Negative for: history of anesthetic complications  Airway Mallampati: III  TM Distance: >3 FB Neck ROM: full    Dental  (+) Teeth Intact   Pulmonary neg shortness of breath, former smoker,    Pulmonary exam normal breath sounds clear to auscultation       Cardiovascular Exercise Tolerance: Good (-) hypertension(-) angina(-) CAD, (-) Past MI, (-) Cardiac Stents and (-) CABG Normal cardiovascular exam(-) dysrhythmias + Valvular Problems/Murmurs  Rhythm:regular Rate:Normal     Neuro/Psych Seizures -, Well Controlled,  PSYCHIATRIC DISORDERS (depression) Depression CVA, Residual Symptoms    GI/Hepatic negative GI ROS, Neg liver ROS,   Endo/Other  neg diabetesHypothyroidism   Renal/GU negative Renal ROS  negative genitourinary   Musculoskeletal   Abdominal   Peds  Hematology negative hematology ROS (+)   Anesthesia Other Findings Past Medical History:   H/O: CVA (cerebrovascular accident)             2012           Comment:a. cerebral edema, craniotomy, residual right               upper & lower limb weakness   Hx of seizure disorder                                         Comment:a. one episode 10 months after CVA, since then               on Keppra    History of echocardiogram                                      Comment:a. 12/01/2013: EF 50-55%, nl global LV systolic              function, mild MR, mildly increased LV               posterior wall thickness   History of stress test                                         Comment:a. 12/01/2013: no significant ischemia, no               significant WMA, no EKG changes concerning for      ischemia, EF 67%, low risk scan   Heart murmur                                                 Thyroid disease                                              Stroke  High cholesterol                                             Reproductive/Obstetrics negative OB ROS                             Anesthesia Physical  Anesthesia Plan  ASA: III  Anesthesia Plan: General   Post-op Pain Management:    Induction:   Airway Management Planned:   Additional Equipment:   Intra-op Plan:   Post-operative Plan:   Informed Consent: I have reviewed the patients History and Physical, chart, labs and discussed the procedure including the risks, benefits and alternatives for the proposed anesthesia with the patient or authorized representative who has indicated his/her understanding and acceptance.   Dental Advisory Given  Plan Discussed with: Anesthesiologist, CRNA and Surgeon  Anesthesia Plan Comments:         Anesthesia Quick Evaluation

## 2014-11-18 NOTE — H&P (Signed)
Primary Care Physician:  Tracie Harrier, MD  Pre-Procedure History & Physical: HPI:  Melissa Day is a 44 y.o. female is here for an endoscopy / colonoscopy   Past Medical History  Diagnosis Date  . H/O: CVA (cerebrovascular accident) 2012    a. cerebral edema, craniotomy, residual right upper & lower limb weakness  . Hx of seizure disorder     a. one episode 10 months after CVA, since then on Keppra   . History of echocardiogram     a. 12/01/2013: EF 50-55%, nl global LV systolic function, mild MR, mildly increased LV posterior wall thickness  . History of stress test     a. 12/01/2013: no significant ischemia, no significant WMA, no EKG changes concerning for ischemia, EF 67%, low risk scan  . Heart murmur   . Thyroid disease   . Stroke ([place])   . High cholesterol   . Hypothyroidism   . Seizures ([place])   . Anemia     Past Surgical History  Procedure Laterality Date  . Brain surgery    . Cystoscopy/retrograde/ureteroscopy/stone extraction with basket Right 10/29/2014    Procedure: CYSTOSCOPY/URETEROSCOPY/STONE EXTRACTION WITH BASKET;  Surgeon: Franchot Gallo, MD;  Location: [facility] ORS;  Service: Urology;  Laterality: Right;  . Tonsillectomy      Prior to Admission medications   Medication Sig Start Date End Date Taking? Authorizing Provider  aspirin EC 81 MG tablet Take 81 mg by mouth daily.    Historical Provider, MD  atorvastatin (LIPITOR) 40 MG tablet Take 1 tablet (40 mg total) by mouth daily. 06/21/14   Ryan M Dunn, PA-C  Docosahexaenoic Acid (DHA OMEGA 3) 100 MG CAPS Take 100 mg by mouth daily.    Historical Provider, MD  ferrous sulfate 325 (65 FE) MG tablet Take 325 mg by mouth daily.     Historical Provider, MD  FLUoxetine (PROZAC) 40 MG capsule Take 40 mg by mouth daily.    Historical Provider, MD  levETIRAcetam (KEPPRA) 750 MG tablet Take 1 tablet (750 mg total) by mouth 2 (two) times daily. 08/21/14   Minna Merritts, MD  levothyroxine (SYNTHROID, LEVOTHROID)  112 MCG tablet Take 112 mcg by mouth daily before breakfast.    Historical Provider, MD  oxybutynin (DITROPAN) 5 MG tablet Take 1 tablet (5 mg total) by mouth every 8 (eight) hours as needed for bladder spasms. 10/30/14   Roda Shutters, FNP  oxyCODONE-acetaminophen (ROXICET) 5-325 MG per tablet Take 1-2 tablets by mouth every 6 (six) hours as needed. Patient taking differently: Take 1-2 tablets by mouth every 6 (six) hours as needed for severe pain.  10/21/14   Orbie Pyo, MD    Allergies as of 10/25/2014  . (No Known Allergies)    Family History  Problem Relation Age of Onset  . Heart disease Father   . CVA Father   . Nephrolithiasis Mother   . Nephrolithiasis Maternal Grandfather     Social History   Social History  . Marital Status: Divorced    Spouse Name: N/A  . Number of Children: N/A  . Years of Education: N/A   Occupational History  . Not on file.   Social History Main Topics  . Smoking status: Former Research scientist (life sciences)  . Smokeless tobacco: Never Used  . Alcohol Use: No  . Drug Use: No  . Sexual Activity: Not on file   Other Topics Concern  . Not on file   Social History Narrative     Physical Exam: BP  122/82 mmHg  Pulse 76  Temp(Src) 98 F (36.7 C) (Tympanic)  Resp 17  SpO2 100%  LMP  (Within Years) General:   Alert,  pleasant and cooperative in NAD Head:  Normocephalic and atraumatic. Neck:  Supple; no masses or thyromegaly. Lungs:  Clear throughout to auscultation.    Heart:  Regular rate and rhythm. Abdomen:  Soft, nontender and nondistended. Normal bowel sounds, without guarding, and without rebound.   Neurologic:  Alert and  oriented x4;  grossly normal neurologically.  Impression/Plan: Melissa Day is here for an endoscopy to be performed for epi pain, melena, colonoscopy for diarhea, RLQ pain  Risks, benefits, limitations, and alternatives regarding  Endoscopy / colonooscopy have been reviewed with the patient.  Questions have been  answered.  All parties agreeable.   Josefine Class, MD  11/18/2014, 10:48 AM

## 2014-11-18 NOTE — Discharge Instructions (Signed)

## 2014-11-18 NOTE — Op Note (Signed)
[facility] Gastroenterology Patient Name: Melissa Day Procedure Date: 11/18/2014 10:42 AM MRN: [id number] Account #: [id number] Date of Birth: Jul 05, 1970 Admit Type: Outpatient Age: 44 Room: [facility] ENDO ROOM 3 Gender: Female Note Status: Finalized Procedure:         Upper GI endoscopy Indications:       Epigastric abdominal pain, Generalized abdominal pain,                     Melena Patient Profile:   This is a 44 year old female. Providers:         Gerrit Heck. Rayann Heman, MD Referring MD:      Tracie Harrier, MD (Referring MD) Medicines:         Propofol per Anesthesia Complications:     No immediate complications. Procedure:         Pre-Anesthesia Assessment:                    - Prior to the procedure, a History and Physical was                     performed, and patient medications, allergies and                     sensitivities were reviewed. The patient's tolerance of                     previous anesthesia was reviewed.                    - Prior to the procedure, a History and Physical was                     performed, and patient medications, allergies and                     sensitivities were reviewed. The patient's tolerance of                     previous anesthesia was reviewed.                    After obtaining informed consent, the endoscope was passed                     under direct vision. Throughout the procedure, the                     patient's blood pressure, pulse, and oxygen saturations                     were monitored continuously. The Olympus GIF-160 endoscope                     (S#. [id number]) was introduced through the mouth, and                     advanced to the second part of duodenum. The upper GI                     endoscopy was accomplished without difficulty. The patient                     tolerated the procedure well. Findings:      The esophagus was normal.      The stomach was normal.  The examined duodenum was  normal. Impression:        - Normal esophagus.                    - Normal stomach.                    - Normal examined duodenum.                    - No specimens collected. Recommendation:    - Perform a colonoscopy today.                    - Continue present medications.                    - The findings and recommendations were discussed with the                     patient.                    - The findings and recommendations were discussed with the                     patient's family. Procedure Code(s): --- Professional ---                    [phone], Esophagogastroduodenoscopy, flexible, transoral;                     diagnostic, including collection of specimen(s) by                     brushing or washing, when performed (separate procedure) CPT copyright 2014 American Medical Association. All rights reserved. The codes documented in this report are preliminary and upon coder review may  be revised to meet current compliance requirements. Mellody Life, MD 11/18/2014 11:05:21 AM This report has been signed electronically. Number of Addenda: 0 Note Initiated On: 11/18/2014 10:42 AM      [facility]

## 2014-11-18 NOTE — Op Note (Signed)
[facility] Gastroenterology Patient Name: Melissa Day Procedure Date: 11/18/2014 10:42 AM MRN: [id number] Account #: [id number] Date of Birth: 1971-01-04 Admit Type: Outpatient Age: 44 Room: [facility] ENDO ROOM 3 Gender: Female Note Status: Finalized Procedure:         Colonoscopy Indications:       Abdominal pain in the right lower quadrant, Chronic                     diarrhea, Melena Patient Profile:   This is a 44 year old female. Providers:         Gerrit Heck. Rayann Heman, MD Referring MD:      Tracie Harrier, MD (Referring MD) Medicines:         Propofol per Anesthesia Complications:     No immediate complications. Procedure:         Pre-Anesthesia Assessment:                    - Prior to the procedure, a History and Physical was                     performed, and patient medications, allergies and                     sensitivities were reviewed. The patient's tolerance of                     previous anesthesia was reviewed.                    After obtaining informed consent, the colonoscope was                     passed under direct vision. Throughout the procedure, the                     patient's blood pressure, pulse, and oxygen saturations                     were monitored continuously. The Colonoscope was                     introduced through the anus and advanced to the the                     terminal ileum. The colonoscopy was performed without                     difficulty. The patient tolerated the procedure well. The                     quality of the bowel preparation was excellent. Findings:      The perianal and digital rectal examinations were normal.      The terminal ileum appeared normal.      A localized area of granular mucosa was found at the ileocecal valve.       Biopsies were taken with a cold forceps for histology.      The exam was otherwise without abnormality on direct and retroflexion       views.      Biopsies for  histology were taken with a cold forceps from the right       colon, left colon and rectum for evaluation of microscopic colitis. Impression:        - The examined portion  of the ileum was normal.                    - Granularity at the ileocecal valve. Biopsied.                    - The examination was otherwise normal on direct and                     retroflexion views.                    - Biopsies were taken with a cold forceps from the right                     colon, left colon and rectum for evaluation of microscopic                     colitis. Recommendation:    - Observe patient in GI recovery unit.                    - High fiber diet.                    - Continue present medications.                    - Await pathology results.                    - Repeat colonoscopy in 10 years for screening purposes.                    - Start pancreatic enzymes for possible pancreatic                     insufficiency (low fecal elastase).                    - Return to GI clinic.                    - The findings and recommendations were discussed with the                     patient.                    - The findings and recommendations were discussed with the                     patient's family. Procedure Code(s): --- Professional ---                    [phone], Colonoscopy, flexible; with biopsy, single or                     multiple CPT copyright 2014 American Medical Association. All rights reserved. The codes documented in this report are preliminary and upon coder review may  be revised to meet current compliance requirements. Mellody Life, MD 11/18/2014 11:29:27 AM This report has been signed electronically. Number of Addenda: 0 Note Initiated On: 11/18/2014 10:42 AM Scope Withdrawal Time: 0 hours 12 minutes 22 seconds  Total Procedure Duration: 0 hours 18 minutes 26 seconds       [facility]

## 2014-11-19 LAB — SURGICAL PATHOLOGY

## 2014-11-27 ENCOUNTER — Encounter: Payer: Self-pay | Admitting: Gastroenterology

## 2014-12-19 ENCOUNTER — Encounter: Admission: RE | Payer: Self-pay | Source: Ambulatory Visit

## 2014-12-19 ENCOUNTER — Ambulatory Visit
Admission: RE | Admit: 2014-12-19 | Payer: Commercial Managed Care - HMO | Source: Ambulatory Visit | Admitting: Gastroenterology

## 2014-12-19 SURGERY — COLONOSCOPY WITH PROPOFOL
Anesthesia: General

## 2015-01-15 DIAGNOSIS — Z87442 Personal history of urinary calculi: Secondary | ICD-10-CM | POA: Insufficient documentation

## 2015-10-30 ENCOUNTER — Other Ambulatory Visit: Payer: Self-pay | Admitting: Internal Medicine

## 2015-10-30 DIAGNOSIS — Z1231 Encounter for screening mammogram for malignant neoplasm of breast: Secondary | ICD-10-CM

## 2015-11-08 ENCOUNTER — Ambulatory Visit
Admission: EM | Admit: 2015-11-08 | Discharge: 2015-11-08 | Disposition: A | Discharge status: Home / Self Care | Payer: Medicare Other | Attending: Family Medicine | Admitting: Family Medicine

## 2015-11-08 ENCOUNTER — Ambulatory Visit (INDEPENDENT_AMBULATORY_CARE_PROVIDER_SITE_OTHER): Payer: Medicare Other

## 2015-11-08 DIAGNOSIS — S93401A Sprain of unspecified ligament of right ankle, initial encounter: Secondary | ICD-10-CM

## 2015-11-08 NOTE — ED Triage Notes (Signed)
Patient complains of having a fall today off the bed. Patient landed on her right side. Patient had a stroke in 2012 and has no use of right side. Patient sister reports that she has a little bit of pain, and some bruising.

## 2015-11-08 NOTE — ED Provider Notes (Signed)
CSN: [id number]     Arrival date & time 11/08/15  1346 History   First MD Initiated Contact with Patient 11/08/15 1429     Chief Complaint  Patient presents with  . Fall  . Ankle Pain    right   (Consider location/radiation/quality/duration/timing/severity/associated sxs/prior Treatment) HPI  This a 45 year old female accompanied by her sister stating that the patient fell out of bed this morning playing with her son and landed hard on her right side. She fell approximately 3 feet onto a carpeted floor. Patient had a stroke in 2012 and has very limited use of her right side and markedly decreased pain sensation. He normally walks with a limp. Her sister became concerned because of some bruising and swelling that she noticed on the lateral aspect of her right ankle. She states that she has "a little bit" of pain on her ankle. She does not complain of any discomfort in her right knee hip wrist elbow or shoulder.  The sister provides the majority of the medical history history of present illness answers most of the questions.    Past Medical History:  Diagnosis Date  . Anemia   . H/O: CVA (cerebrovascular accident) 2012   a. cerebral edema, craniotomy, residual right upper & lower limb weakness  . Heart murmur   . High cholesterol   . History of echocardiogram    a. 12/01/2013: EF 50-55%, nl global LV systolic function, mild MR, mildly increased LV posterior wall thickness  . History of stress test    a. 12/01/2013: no significant ischemia, no significant WMA, no EKG changes concerning for ischemia, EF 67%, low risk scan  . Hx of seizure disorder    a. one episode 10 months after CVA, since then on Keppra   . Hypothyroidism   . Seizures ([place])   . Stroke ([place])   . Thyroid disease    Past Surgical History:  Procedure Laterality Date  . BRAIN SURGERY    . COLONOSCOPY WITH PROPOFOL N/A 11/18/2014   Procedure: COLONOSCOPY WITH PROPOFOL;  Surgeon: Josefine Class, MD;  Location[facility]  [facility] ENDOSCOPY;  Service: Endoscopy;  Laterality: N/A;  . CYSTOSCOPY/RETROGRADE/URETEROSCOPY/STONE EXTRACTION WITH BASKET Right 10/29/2014   Procedure: CYSTOSCOPY/URETEROSCOPY/STONE EXTRACTION WITH BASKET;  Surgeon: Franchot Gallo, MD;  Location: [facility] ORS;  Service: Urology;  Laterality: Right;  . ESOPHAGOGASTRODUODENOSCOPY (EGD) WITH PROPOFOL N/A 11/18/2014   Procedure: ESOPHAGOGASTRODUODENOSCOPY (EGD) WITH PROPOFOL;  Surgeon: Josefine Class, MD;  Location: [facility] ENDOSCOPY;  Service: Endoscopy;  Laterality: N/A;  . TONSILLECTOMY     Family History  Problem Relation Age of Onset  . Nephrolithiasis Mother   . Heart disease Father   . CVA Father   . Nephrolithiasis Maternal Grandfather    Social History  Substance Use Topics  . Smoking status: Former Research scientist (life sciences)  . Smokeless tobacco: Never Used  . Alcohol use No   OB History    No data available     Review of Systems  Constitutional: Negative for activity change, chills, fatigue and fever.  Musculoskeletal: Positive for arthralgias and gait problem.  All other systems reviewed and are negative.   Allergies  Review of patient's allergies indicates no known allergies.  Home Medications   Prior to Admission medications   Medication Sig Start Date End Date Taking? Authorizing Provider  aspirin EC 81 MG tablet Take 81 mg by mouth daily.   Yes Historical Provider, MD  atorvastatin (LIPITOR) 40 MG tablet Take 1 tablet (40 mg total) by mouth daily. 06/21/14  Yes Ryan M Dunn, PA-C  Docosahexaenoic Acid (DHA OMEGA 3) 100 MG CAPS Take 100 mg by mouth daily.   Yes Historical Provider, MD  ferrous sulfate 325 (65 FE) MG tablet Take 325 mg by mouth daily.    Yes Historical Provider, MD  FLUoxetine (PROZAC) 40 MG capsule Take 40 mg by mouth daily.   Yes Historical Provider, MD  levETIRAcetam (KEPPRA) 750 MG tablet Take 1 tablet (750 mg total) by mouth 2 (two) times daily. 08/21/14  Yes Minna Merritts, MD  levothyroxine (SYNTHROID,  LEVOTHROID) 112 MCG tablet Take 112 mcg by mouth daily before breakfast.   Yes Historical Provider, MD  oxybutynin (DITROPAN) 5 MG tablet Take 1 tablet (5 mg total) by mouth every 8 (eight) hours as needed for bladder spasms. 10/30/14  Yes Roda Shutters, FNP  oxyCODONE-acetaminophen (ROXICET) 5-325 MG per tablet Take 1-2 tablets by mouth every 6 (six) hours as needed. Patient taking differently: Take 1-2 tablets by mouth every 6 (six) hours as needed for severe pain.  10/21/14  Yes Orbie Pyo, MD   Meds Ordered and Administered this Visit  Medications - No data to display  BP 132/83 (BP Location: Left Arm)   Pulse 78   Temp 97.8 F (36.6 C) (Tympanic)   Resp 17   Ht 5\' 2"  (1.575 m)   Wt 220 lb (99.8 kg)   SpO2 98%   BMI 40.24 kg/m  No data found.   Physical Exam  Constitutional: She is oriented to person, place, and time. She appears well-developed and well-nourished. No distress.  HENT:  Head: Normocephalic and atraumatic.  Eyes: EOM are normal. Pupils are equal, round, and reactive to light.  Neck: Normal range of motion. Neck supple.  Musculoskeletal: Normal range of motion. She exhibits edema and tenderness.  Examination of the right ankle shows good dorsiflexion and plantar flexion all the patient tends to hold her foot in slight inversion which the sister states is normal for her. They have had stiffness of her ankle worse since the CVA. Wrist swelling over the lateral malleolus. Some mild tenderness in this area as well as the anterior fibulotalar ligament region. She complains of tenderness with bilateral compression of the distal tib-fib. Knee range of motion is full and comfortable hip range of motion to internal/external rotation does not cause any discomfort. Shoulder range of motion shows a good abduction with the 10 of lacking of full abduction negating scapular motion. Internal/external rotation are normal and comfortable. Elbow range of motion is  comfortable and normal as is the wrist.  Neurological: She is alert and oriented to person, place, and time. She exhibits abnormal muscle tone. Coordination abnormal.  Skin: Skin is warm and dry. She is not diaphoretic.  Psychiatric: She has a normal mood and affect. Her behavior is normal. Judgment and thought content normal.  Nursing note and vitals reviewed.   Urgent Care Course   Clinical Course    Procedures (including critical care time)  Labs Review Labs Reviewed - No data to display  Imaging Review Dg Ankle Complete Right  Result Date: 11/08/2015 CLINICAL DATA:  Right ankle pain after fall today. EXAM: RIGHT ANKLE - COMPLETE 3+ VIEW COMPARISON:  None. FINDINGS: There is no evidence of fracture, dislocation, or joint effusion. There is no evidence of arthropathy or other focal bone abnormality. Soft tissues are unremarkable. IMPRESSION: Normal right ankle. Electronically Signed   By: Marijo Conception, M.D.   On: 11/08/2015 15:03  Visual Acuity Review  Right Eye Distance:   Left Eye Distance:   Bilateral Distance:    Right Eye Near:   Left Eye Near:    Bilateral Near:     An Ace wrap was applied to the right ankle.    MDM   1. Right ankle sprain, initial encounter    Discharge Medication List as of 11/08/2015  4:04 PM    Plan: 1. Test/x-ray results and diagnosis reviewed with patient 2. rx as per orders; risks, benefits, potential side effects reviewed with patient 3. Recommend supportive treatment with ice and elevation as necessary for comfort. Ace wrap is for comfort and support only. Uses only as necessary. If she continues to have problems she should follow-up with her primary care physician 4. F/u prn if symptoms worsen or don't improve     Lorin Picket, PA-C 11/08/15 [place] Roemer, PA-C 11/08/15 1728

## 2015-11-10 ENCOUNTER — Telehealth: Payer: Self-pay

## 2015-11-10 NOTE — Telephone Encounter (Signed)
Courtesy call back completed today after patients visit at [facility]. Patient improved and will follow up with their PCP if symptoms continue or worsen.   

## 2015-11-13 ENCOUNTER — Ambulatory Visit: Payer: Commercial Managed Care - HMO

## 2015-11-13 ENCOUNTER — Ambulatory Visit: Payer: Medicare Other | Admitting: Occupational Therapy

## 2015-11-14 ENCOUNTER — Ambulatory Visit: Payer: Medicare Other | Admitting: Occupational Therapy

## 2015-11-17 ENCOUNTER — Encounter: Payer: Medicare Other | Admitting: Occupational Therapy

## 2015-11-18 ENCOUNTER — Other Ambulatory Visit: Payer: Self-pay | Admitting: Rehabilitation

## 2015-11-18 ENCOUNTER — Ambulatory Visit: Payer: Medicare Other | Attending: Rehabilitation | Admitting: Occupational Therapy

## 2015-11-18 ENCOUNTER — Ambulatory Visit
Admission: RE | Admit: 2015-11-18 | Discharge: 2015-11-18 | Disposition: A | Discharge status: Home / Self Care | Payer: Medicare Other | Source: Ambulatory Visit | Attending: Rehabilitation | Admitting: Rehabilitation

## 2015-11-18 ENCOUNTER — Ambulatory Visit
Admission: RE | Admit: 2015-11-18 | Discharge: 2015-11-18 | Disposition: A | Discharge status: Home / Self Care | Payer: Medicare Other | Source: Ambulatory Visit | Attending: Internal Medicine | Admitting: Internal Medicine

## 2015-11-18 ENCOUNTER — Encounter: Payer: Self-pay | Admitting: Occupational Therapy

## 2015-11-18 DIAGNOSIS — M6281 Muscle weakness (generalized): Secondary | ICD-10-CM | POA: Diagnosis present

## 2015-11-18 DIAGNOSIS — I639 Cerebral infarction, unspecified: Secondary | ICD-10-CM | POA: Insufficient documentation

## 2015-11-18 DIAGNOSIS — M1711 Unilateral primary osteoarthritis, right knee: Secondary | ICD-10-CM | POA: Insufficient documentation

## 2015-11-18 DIAGNOSIS — Z8673 Personal history of transient ischemic attack (TIA), and cerebral infarction without residual deficits: Secondary | ICD-10-CM | POA: Diagnosis present

## 2015-11-18 DIAGNOSIS — IMO0002 Reserved for concepts with insufficient information to code with codable children: Secondary | ICD-10-CM

## 2015-11-18 DIAGNOSIS — Z1231 Encounter for screening mammogram for malignant neoplasm of breast: Secondary | ICD-10-CM | POA: Insufficient documentation

## 2015-11-18 DIAGNOSIS — R279 Unspecified lack of coordination: Secondary | ICD-10-CM | POA: Diagnosis present

## 2015-11-18 DIAGNOSIS — M25561 Pain in right knee: Secondary | ICD-10-CM | POA: Diagnosis present

## 2015-11-18 DIAGNOSIS — I69959 Hemiplegia and hemiparesis following unspecified cerebrovascular disease affecting unspecified side: Secondary | ICD-10-CM | POA: Diagnosis present

## 2015-11-19 ENCOUNTER — Encounter: Payer: Medicare Other | Admitting: Occupational Therapy

## 2015-11-20 ENCOUNTER — Ambulatory Visit: Payer: Medicare Other | Admitting: Occupational Therapy

## 2015-11-20 DIAGNOSIS — Z8673 Personal history of transient ischemic attack (TIA), and cerebral infarction without residual deficits: Secondary | ICD-10-CM

## 2015-11-20 DIAGNOSIS — I69959 Hemiplegia and hemiparesis following unspecified cerebrovascular disease affecting unspecified side: Secondary | ICD-10-CM

## 2015-11-20 DIAGNOSIS — IMO0002 Reserved for concepts with insufficient information to code with codable children: Secondary | ICD-10-CM

## 2015-11-20 DIAGNOSIS — M6281 Muscle weakness (generalized): Secondary | ICD-10-CM | POA: Diagnosis not present

## 2015-11-21 ENCOUNTER — Encounter: Payer: Self-pay | Admitting: Occupational Therapy

## 2015-11-21 NOTE — Therapy (Signed)
[facility] [address] [place], [place], [address] Phone: [phone]   Fax:  [phone]  Occupational Therapy Evaluation  Patient Details  Name: Melissa Day MRN: [id number] Date of Birth: 1970-09-05 Referring Provider: Lottie Rater  Encounter Date: 11/18/2015      OT End of Session - 11/21/15 1106    Visit Number 1   Number of Visits 16   Date for OT Re-Evaluation 01/13/16   Authorization Type medicare G code 1   OT Start Time 1043   OT Stop Time 1145   OT Time Calculation (min) 62 min   Activity Tolerance Patient tolerated treatment well   Behavior During Therapy [facility] for tasks assessed/performed      Past Medical History:  Diagnosis Date  . Anemia   . H/O: CVA (cerebrovascular accident) 2012   a. cerebral edema, craniotomy, residual right upper & lower limb weakness  . Heart murmur   . High cholesterol   . History of echocardiogram    a. 12/01/2013: EF 50-55%, nl global LV systolic function, mild MR, mildly increased LV posterior wall thickness  . History of stress test    a. 12/01/2013: no significant ischemia, no significant WMA, no EKG changes concerning for ischemia, EF 67%, low risk scan  . Hx of seizure disorder    a. one episode 10 months after CVA, since then on Keppra   . Hypothyroidism   . Seizures ([place])   . Stroke ([place])   . Thyroid disease     Past Surgical History:  Procedure Laterality Date  . BRAIN SURGERY    . COLONOSCOPY WITH PROPOFOL N/A 11/18/2014   Procedure: COLONOSCOPY WITH PROPOFOL;  Surgeon: Josefine Class, MD;  Location: [facility] ENDOSCOPY;  Service: Endoscopy;  Laterality: N/A;  . CYSTOSCOPY/RETROGRADE/URETEROSCOPY/STONE EXTRACTION WITH BASKET Right 10/29/2014   Procedure: CYSTOSCOPY/URETEROSCOPY/STONE EXTRACTION WITH BASKET;  Surgeon: Franchot Gallo, MD;  Location: [facility] ORS;  Service: Urology;  Laterality: Right;  . ESOPHAGOGASTRODUODENOSCOPY (EGD) WITH  PROPOFOL N/A 11/18/2014   Procedure: ESOPHAGOGASTRODUODENOSCOPY (EGD) WITH PROPOFOL;  Surgeon: Josefine Class, MD;  Location: [facility] ENDOSCOPY;  Service: Endoscopy;  Laterality: N/A;  . TONSILLECTOMY      There were no vitals filed for this visit.      Subjective Assessment - 11/21/15 1054    Subjective  Patient reports she is here for therapy about her right arm and hand, staying tight.  Has been getting Botox, last injection about 2-3 weeks ago.     Patient is accompained by: Family member   Pertinent History Patient had a CVA about 5 years ago, recent increase in right UE for spasticity and went for Botox injections to help.  Has not been wearing splint at night for her hand which has affected her finger extension.   Patient Stated Goals Patient states she wants her hand to relax, be more useful like cutting meat/food.     Currently in Pain? Yes   Pain Score 6    Pain Location Knee   Pain Orientation Right   Pain Descriptors / Indicators Sharp;Tightness   Pain Type Acute pain   Pain Frequency Constant           [facility] OT Assessment - 11/21/15 1054      Assessment   Diagnosis right spastic hemiparesis   Referring Provider Lottie Rater   Onset Date 11/04/15   Prior Therapy ot,PT SLP     Balance Screen   Has the patient fallen in the  past 6 months Yes   How many times? 1   Has the patient had a decrease in activity level because of a fear of falling?  No   Is the patient reluctant to leave their home because of a fear of falling?  No     Home  Environment   Family/patient expects to be discharged to: Private residence   Living Arrangements Children   Available Help at Discharge Family   Type of [place] Access Level entry   Home Layout One level   Bathroom Shower/Tub Tub/Shower unit;Curtain   Shower/tub characteristics Curtain   Designer, jewellery   Lives With [employer] (Comment)  76 yo old son     Prior Function   Level of  Independence Independent with basic ADLs   Vocation On disability     ADL   Eating/Feeding Minimal assistance   Upper Body Bathing Modified independent   Lower Body Bathing Modified independent   Upper Body Dressing Increased time   Lower Body Dressing Needs assist for fasteners;Increased time   Toilet Tranfer Modified independent   Toileting - Clothing Manipulation Modified independent   Tub/Shower Transfer Modified independent   ADL comments Patient has difficulty with cutting meat, grooming hair, managing nails, tying shoes, sweeping, mopping, managing heavy items in the kitchen     IADL   Prior Level of Function Shopping independent prior to CVA    Shopping Needs to be accompanied on any shopping trip   Prior Level of Function Light Housekeeping Independent prior to CVA   Medication Management Is responsible for taking medication in correct dosages at correct time   Financial Management Requires assistance     Mobility   Mobility Status Independent     Vision - History   Baseline Vision No visual deficits     Cognition   Overall Cognitive Status Within Functional Limits for tasks assessed   Attention Focused   Memory Appears intact   Awareness Appears intact   Problem Solving Appears intact     Sensation   Light Touch Impaired by gross assessment   Stereognosis Impaired Detail     Coordination   Gross Motor Movements are Fluid and Coordinated No   Fine Motor Movements are Fluid and Coordinated No   Finger Nose Finger Test impaired   9 Hole Peg Test Right;Left   Right 9 Hole Peg Test unable   Left 9 Hole Peg Test 26 secs     ROM / Strength   AROM / PROM / Strength AROM;PROM;Strength     AROM   Overall AROM  Deficits   Overall AROM Comments Right shoulder limited to 110 shoulder flexion, limited ER 1/2 range, wrist flexion 5 degrees, slow release with tone.  Partial fist on right 3.5 cm from palm, hyperextension of the PIP of the right hand, neutral DIPs,  supination to neutral only, initiation of thumb extension, initiation of thumb flexion, Digit at MP for extension active, index -45 degrees, middle finger -65 degrees, ring finger =55 degrees, small finger -35 degrees.       PROM   Overall PROM  Within functional limits for tasks performed     Strength   Overall Strength Deficits   Overall Strength Comments 4/5 strength in RUE except at wrist, 3-/5.       Hand Function   Right Hand Grip (lbs) 2   Right Hand Lateral Pinch 4 lbs   Right Hand 3 Point Pinch 0 lbs  Left Hand Grip (lbs) 38   Left Hand Lateral Pinch 13 lbs   Left 3 point pinch 17 lbs        Patient instructed on ROM of digits, wrist and hand prior to attempts to don splint for night time use at home.                  OT Education - 11/21/15 1105    Education provided Yes   Education Details requested patient to bring in splint next session, role of OT /goals   Person(s) Educated Patient;Parent(s)   Methods Explanation   Comprehension Verbalized understanding             OT Long Term Goals - 11/21/15 1208      OT LONG TERM GOAL #1   Title Patient will demonstrate ability to cut food with modified independence.     Baseline unable at eval   Time 8   Period Weeks   Status New     OT LONG TERM GOAL #2   Title Patient will improve active ROM of right hand to hold comb to manage the right side of her hair.    Baseline unable    Time 8   Period Weeks   Status New     OT LONG TERM GOAL #3   Title Patient will clip fingernails with modified independence using adaptive equipment as needed.    Baseline unable at eval    Time 8   Period Weeks   Status New     OT LONG TERM GOAL #4   Title Patient will complete shoe tying with modified independence.    Baseline unable at eval   Time 8   Period Weeks   Status New     OT LONG TERM GOAL #5   Title Patient will complete sweeping of the floor with bilateral use of UEs to help manage and control  the broom with modified independence.    Baseline moderate difficulty to unable    Time 8   Period Weeks   Status New     Long Term Additional Goals   Additional Long Term Goals Yes     OT LONG TERM GOAL #6   Title Patient will demonstrate techniques to manage a move larger, heavy kitchen items with modified techniques and independence.    Baseline unable to manage filled pot of water   Time 8   Period Weeks   Status New               Plan - 11/21/15 1107    Clinical Impression Statement Patient is a 45 yo female who suffered a CVA about 5 years ago and has had a recent increase in right UE spasticity and decreased ROM, muscle weakness and decreased functional use.  She received a round of Botox injections about 10 days ago and was referred to OT to work in conjunction with injections to work towards improving RUE use.  Patient presents with muscle weakness, increased spasticity of the RUE, decreased coordination and decreased ability to perform ADL/IADL tasks such as cutting food, grooming, managing shoes, sweeping, mopping and lifting heavier items in the kitchen during cooking.  She would benefit from skilled OT to maximize her safety and independence with daily tasks.    Rehab Potential Good   OT Frequency 2x / week   OT Duration 8 weeks   OT Treatment/Interventions Self-care/ADL training;Therapeutic exercise;Moist Heat;Neuromuscular education;Splinting;Therapeutic exercises;Patient/family education;DME and/or AE instruction;Manual Therapy;Passive range of  motion;Therapeutic activities   Consulted and Agree with Plan of Care Patient;Family member/caregiver   Family Member Consulted mom      Patient will benefit from skilled therapeutic intervention in order to improve the following deficits and impairments:  Impaired flexibility, Pain, Decreased coordination, Impaired sensation, Decreased range of motion, Decreased strength, Impaired tone, Impaired UE functional use  Visit  Diagnosis: Muscle weakness (generalized)  H/O: CVA (cerebrovascular accident)  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 Ureteral stone 10/29/2014  . Atypical chest pain 08/21/2014  . Malaise and fatigue 08/21/2014  . Morbid obesity ([place]) 08/21/2014  . H/O: CVA (cerebrovascular accident)   . Hx of seizure disorder   . History of echocardiogram   . History of stress test   . Heart murmur   . Thyroid disease   . Clinical depression 12/12/2013  . Vitamin D deficiency 12/12/2013  . 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 [place], [place], [address] Phone: [phone]   Fax:  [phone]  Name: Melissa Day MRN: [id number] Date of Birth: 1970/03/24

## 2015-11-21 NOTE — Therapy (Signed)
[place] MAIN [facility] SERVICES [address] [place], [place], [address] Phone: [phone]   Fax:  [phone]  Occupational Therapy Treatment  Patient Details  Name: Melissa Day MRN: [id number] Date of Birth: 1970/02/24 Referring Provider: Lottie Rater  Encounter Date: 11/20/2015      OT End of Session - 11/21/15 1410    Visit Number 2   Number of Visits 16   Date for OT Re-Evaluation 01/13/16   Authorization Type medicare G code 2   OT Start Time 0827   OT Stop Time 0915   OT Time Calculation (min) 48 min   Activity Tolerance Patient tolerated treatment well   Behavior During Therapy [facility] for tasks assessed/performed      Past Medical History:  Diagnosis Date  . Anemia   . H/O: CVA (cerebrovascular accident) 2012   a. cerebral edema, craniotomy, residual right upper & lower limb weakness  . Heart murmur   . High cholesterol   . History of echocardiogram    a. 12/01/2013: EF 50-55%, nl global LV systolic function, mild MR, mildly increased LV posterior wall thickness  . History of stress test    a. 12/01/2013: no significant ischemia, no significant WMA, no EKG changes concerning for ischemia, EF 67%, low risk scan  . Hx of seizure disorder    a. one episode 10 months after CVA, since then on Keppra   . Hypothyroidism   . Seizures ([place])   . Stroke ([place])   . Thyroid disease     Past Surgical History:  Procedure Laterality Date  . BRAIN SURGERY    . COLONOSCOPY WITH PROPOFOL N/A 11/18/2014   Procedure: COLONOSCOPY WITH PROPOFOL;  Surgeon: Josefine Class, MD;  Location: [facility] ENDOSCOPY;  Service: Endoscopy;  Laterality: N/A;  . CYSTOSCOPY/RETROGRADE/URETEROSCOPY/STONE EXTRACTION WITH BASKET Right 10/29/2014   Procedure: CYSTOSCOPY/URETEROSCOPY/STONE EXTRACTION WITH BASKET;  Surgeon: Franchot Gallo, MD;  Location: [facility] ORS;  Service: Urology;  Laterality: Right;  . ESOPHAGOGASTRODUODENOSCOPY (EGD) WITH  PROPOFOL N/A 11/18/2014   Procedure: ESOPHAGOGASTRODUODENOSCOPY (EGD) WITH PROPOFOL;  Surgeon: Josefine Class, MD;  Location: [facility] ENDOSCOPY;  Service: Endoscopy;  Laterality: N/A;  . TONSILLECTOMY      There were no vitals filed for this visit.      Subjective Assessment - 11/21/15 1404    Patient is accompained by: Family member   Pertinent History Patient had a CVA about 5 years ago, recent increase in right UE for spasticity and went for Botox injections to help.  Has not been wearing splint at night for her hand which has affected her finger extension.   Patient Stated Goals Patient states she wants her hand to relax, be more useful like cutting meat/food.     Currently in Pain? No/denies   Pain Score 0-No pain            [facility] OT Assessment - 11/21/15 1054      Assessment   Diagnosis right spastic hemiparesis   Referring Provider Lottie Rater   Onset Date 11/04/15   Prior Therapy ot,PT SLP     Balance Screen   Has the patient fallen in the past 6 months Yes   How many times? 1   Has the patient had a decrease in activity level because of a fear of falling?  No   Is the patient reluctant to leave their home because of a fear of falling?  No     Home  Environment   Family/patient expects to  be discharged to: Private residence   Living Arrangements Children   Available Help at Discharge Family   Type of [place] Access Level entry   Home Layout One level   Bathroom Shower/Tub Tub/Shower unit;Curtain   Shower/tub characteristics Curtain   Designer, jewellery   Lives With [employer] (Comment)  24 yo old son     Prior Function   Level of Independence Independent with basic ADLs   Vocation On disability     ADL   Eating/Feeding Minimal assistance   Upper Body Bathing Modified independent   Lower Body Bathing Modified independent   Upper Body Dressing Increased time   Lower Body Dressing Needs assist for fasteners;Increased time    Toilet Tranfer Modified independent   Toileting - Clothing Manipulation Modified independent   Tub/Shower Transfer Modified independent   ADL comments Patient has difficulty with cutting meat, grooming hair, managing nails, tying shoes, sweeping, mopping, managing heavy items in the kitchen     IADL   Prior Level of Function Shopping independent prior to CVA    Shopping Needs to be accompanied on any shopping trip   Prior Level of Function Light Housekeeping Independent prior to CVA   Medication Management Is responsible for taking medication in correct dosages at correct time   Financial Management Requires assistance     Mobility   Mobility Status Independent     Vision - History   Baseline Vision No visual deficits     Cognition   Overall Cognitive Status Within Functional Limits for tasks assessed   Attention Focused   Memory Appears intact   Awareness Appears intact   Problem Solving Appears intact     Sensation   Light Touch Impaired by gross assessment   Stereognosis Impaired Detail     Coordination   Gross Motor Movements are Fluid and Coordinated No   Fine Motor Movements are Fluid and Coordinated No   Finger Nose Finger Test impaired   9 Hole Peg Test Right;Left   Right 9 Hole Peg Test unable   Left 9 Hole Peg Test 26 secs     ROM / Strength   AROM / PROM / Strength AROM;PROM;Strength     AROM   Overall AROM  Deficits   Overall AROM Comments Right shoulder limited to 110 shoulder flexion, limited ER 1/2 range, wrist flexion 5 degrees, slow release with tone.  Partial fist on right 3.5 cm from palm, hyperextension of the PIP of the right hand, neutral DIPs, supination to neutral only, initiation of thumb extension, initiation of thumb flexion, Digit at MP for extension active, index -45 degrees, middle finger -65 degrees, ring finger =55 degrees, small finger -35 degrees.       PROM   Overall PROM  Within functional limits for tasks performed     Strength    Overall Strength Deficits   Overall Strength Comments 4/5 strength in RUE except at wrist, 3-/5.       Hand Function   Right Hand Grip (lbs) 2   Right Hand Lateral Pinch 4 lbs   Right Hand 3 Point Pinch 0 lbs   Left Hand Grip (lbs) 38   Left Hand Lateral Pinch 13 lbs   Left 3 point pinch 17 lbs                  OT Treatments/Exercises (OP) - 11/21/15 1404      Neurological Re-education Exercises   Other Exercises 1 PROM of  right hand, digits and wrist followed by attempts at active movement prior to splint fitting.  Cues for technique..   Other Exercises 2 Gross grasp and release of various sized objects with right hand with cues for technique and prehension patterns..       Splinting   Splinting Patient brought in splint for reassessment, patient has not been wearing splint at night for a while now which is affecting her extension of her fingers on the right.  Adjustments made to the splint for the thumb area as well as reinstruction to patient for donning and doffing specifically making sure hand is up further and digits are within the finger separators.  Advised her she may need to keep her thumb out of the splint at this time until her fingers improve and we may reassess for another splint after she wears this one for a week or 2.  Patient instructed on alternative way to bring strap over MPs.  She demos understanding                OT Education - 11/21/15 1410    Education provided Yes   Education Details splint wear   Person(s) Educated Patient   Methods Explanation;Demonstration;Verbal cues   Comprehension Verbal cues required;Returned demonstration;Verbalized understanding             OT Long Term Goals - 11/21/15 1208      OT LONG TERM GOAL #1   Title Patient will demonstrate ability to cut food with modified independence.     Baseline unable at eval   Time 8   Period Weeks   Status New     OT LONG TERM GOAL #2   Title Patient will improve  active ROM of right hand to hold comb to manage the right side of her hair.    Baseline unable    Time 8   Period Weeks   Status New     OT LONG TERM GOAL #3   Title Patient will clip fingernails with modified independence using adaptive equipment as needed.    Baseline unable at eval    Time 8   Period Weeks   Status New     OT LONG TERM GOAL #4   Title Patient will complete shoe tying with modified independence.    Baseline unable at eval   Time 8   Period Weeks   Status New     OT LONG TERM GOAL #5   Title Patient will complete sweeping of the floor with bilateral use of UEs to help manage and control the broom with modified independence.    Baseline moderate difficulty to unable    Time 8   Period Weeks   Status New     Long Term Additional Goals   Additional Long Term Goals Yes     OT LONG TERM GOAL #6   Title Patient will demonstrate techniques to manage a move larger, heavy kitchen items with modified techniques and independence.    Baseline unable to manage filled pot of water   Time 8   Period Weeks   Status New               Plan - 11/21/15 1410    Clinical Impression Statement Patient able to demo understanding of splint wear, care, schedule and adjustments, including alternatives for straps and thumb.  Patient to wear at night for the next 1-2 weeks and will reassess.  Patient has tightness in web space of right  hand and has difficulty with thumb in splint which affects fit for the digits as well.  Patient demonstrates decreased ability to manipulate items with right hand but is able to demo grasp and release with effort and cues. Continue to work towards goals to improve RUE function for daily tasks.   Rehab Potential Good   OT Frequency 2x / week   OT Duration 8 weeks   OT Treatment/Interventions Self-care/ADL training;Therapeutic exercise;Moist Heat;Neuromuscular education;Splinting;Therapeutic exercises;Patient/family education;DME and/or AE  instruction;Manual Therapy;Passive range of motion;Therapeutic activities   Consulted and Agree with Plan of Care Patient      Patient will benefit from skilled therapeutic intervention in order to improve the following deficits and impairments:  Impaired flexibility, Pain, Decreased coordination, Impaired sensation, Decreased range of motion, Decreased strength, Impaired tone, Impaired UE functional use  Visit Diagnosis: H/O: CVA (cerebrovascular accident)  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 Ureteral stone 10/29/2014  . Atypical chest pain 08/21/2014  . Malaise and fatigue 08/21/2014  . Morbid obesity ([place]) 08/21/2014  . H/O: CVA (cerebrovascular accident)   . Hx of seizure disorder   . History of echocardiogram   . History of stress test   . Heart murmur   . Thyroid disease   . Clinical depression 12/12/2013  . Vitamin D deficiency 12/12/2013  . 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 [place], [place], [address] Phone: [phone]   Fax:  [phone]  Name: Melissa Day MRN: [id number] Date of Birth: Dec 01, 1970

## 2015-11-24 ENCOUNTER — Encounter: Payer: Medicare Other | Admitting: Occupational Therapy

## 2015-11-26 ENCOUNTER — Encounter: Payer: Self-pay | Admitting: Occupational Therapy

## 2015-11-26 ENCOUNTER — Ambulatory Visit: Payer: Medicare Other | Admitting: Occupational Therapy

## 2015-11-26 DIAGNOSIS — M6281 Muscle weakness (generalized): Secondary | ICD-10-CM | POA: Diagnosis not present

## 2015-11-26 NOTE — Therapy (Signed)
[place] MAIN [facility] SERVICES [address] [place], [place], [address] Phone: [phone]   Fax:  [phone]  Occupational Therapy Treatment  Patient Details  Name: Melissa Day MRN: [id number] Date of Birth: [birth year] Referring Provider: Lottie Rater  Encounter Date: 11/26/2015      OT End of Session - 11/26/15 1155    Visit Number 3   Number of Visits 16   Date for OT Re-Evaluation 01/13/16   Authorization Type medicare G code 3   OT Start Time 1050   OT Stop Time 1130   OT Time Calculation (min) 40 min   Activity Tolerance Patient tolerated treatment well   Behavior During Therapy [facility] for tasks assessed/performed      Past Medical History:  Diagnosis Date  . Anemia   . H/O: CVA (cerebrovascular accident) 2012   a. cerebral edema, craniotomy, residual right upper & lower limb weakness  . Heart murmur   . High cholesterol   . History of echocardiogram    a. 12/01/2013: EF 50-55%, nl global LV systolic function, mild MR, mildly increased LV posterior wall thickness  . History of stress test    a. 12/01/2013: no significant ischemia, no significant WMA, no EKG changes concerning for ischemia, EF 67%, low risk scan  . Hx of seizure disorder    a. one episode 10 months after CVA, since then on Keppra   . Hypothyroidism   . Seizures ([place])   . Stroke ([place])   . Thyroid disease     Past Surgical History:  Procedure Laterality Date  . BRAIN SURGERY    . COLONOSCOPY WITH PROPOFOL N/A 11/18/2014   Procedure: COLONOSCOPY WITH PROPOFOL;  Surgeon: Josefine Class, MD;  Location: [facility] ENDOSCOPY;  Service: Endoscopy;  Laterality: N/A;  . CYSTOSCOPY/RETROGRADE/URETEROSCOPY/STONE EXTRACTION WITH BASKET Right 10/29/2014   Procedure: CYSTOSCOPY/URETEROSCOPY/STONE EXTRACTION WITH BASKET;  Surgeon: Franchot Gallo, MD;  Location: [facility] ORS;  Service: Urology;  Laterality: Right;  . ESOPHAGOGASTRODUODENOSCOPY (EGD) WITH  PROPOFOL N/A 11/18/2014   Procedure: ESOPHAGOGASTRODUODENOSCOPY (EGD) WITH PROPOFOL;  Surgeon: Josefine Class, MD;  Location: [facility] ENDOSCOPY;  Service: Endoscopy;  Laterality: N/A;  . TONSILLECTOMY      There were no vitals filed for this visit.      Subjective Assessment - 11/26/15 1152    Subjective  Pt. reports having Botox Injections 3 weeks ago. Pt. reports getting them every 3 months.   Patient is accompained by: Family member   Pertinent History Patient had a CVA about 5 years ago, recent increase in right UE for spasticity and went for Botox injections to help.  Has not been wearing splint at night for her hand which has affected her finger extension.   Patient Stated Goals Patient states she wants her hand to relax, be more useful like cutting meat/food.     Currently in Pain? No/denies   Pain Score 0-No pain      OT TREATMENT    Neuro muscular re-education:  Pt. Worked on active grasping, and releasing 1" objects. Pt. Worked with pegs, and stacking discs. Pt. Education was provided about weightbearing through her RUE and hand alternating between activity.  Therapeutic Exercise:  Pt. Worked on AAROM in all joint ranges of the RUE and hand. Self-ROM for the Right hand was reviewed with pt.    Manual Therapy:  Pt. Tolerated retrograde massage for edema control in her Right hand. And digits. Pt. Education was provided about edema control techniques.  OT Education - 11/26/15 1154    Education provided Yes   Education Details RUE functioing, ROM, and edema control.   Person(s) Educated Patient   Methods Explanation;Verbal cues;Demonstration   Comprehension Verbalized understanding;Returned demonstration;Verbal cues required             OT Long Term Goals - 11/21/15 1208      OT LONG TERM GOAL #1   Title Patient will demonstrate ability to cut food with modified independence.     Baseline unable at eval    Time 8   Period Weeks   Status New     OT LONG TERM GOAL #2   Title Patient will improve active ROM of right hand to hold comb to manage the right side of her hair.    Baseline unable    Time 8   Period Weeks   Status New     OT LONG TERM GOAL #3   Title Patient will clip fingernails with modified independence using adaptive equipment as needed.    Baseline unable at eval    Time 8   Period Weeks   Status New     OT LONG TERM GOAL #4   Title Patient will complete shoe tying with modified independence.    Baseline unable at eval   Time 8   Period Weeks   Status New     OT LONG TERM GOAL #5   Title Patient will complete sweeping of the floor with bilateral use of UEs to help manage and control the broom with modified independence.    Baseline moderate difficulty to unable    Time 8   Period Weeks   Status New     Long Term Additional Goals   Additional Long Term Goals Yes     OT LONG TERM GOAL #6   Title Patient will demonstrate techniques to manage a move larger, heavy kitchen items with modified techniques and independence.    Baseline unable to manage filled pot of water   Time 8   Period Weeks   Status New               Plan - 11/26/15 1155    Clinical Impression Statement Pt. is tolerating ROM to the RUE well. Pt. is initiating active thumb movements needed for grasping, and releasing 1" objects. Pt. was able to demonstarte edema control techniques for Right hand, as pt. presents with edema through the MPs. Pt. continues to benefit from skilled OT services to work on improving right hand function for use in ADLs, and IADLs.   Rehab Potential Good   OT Frequency 2x / week   OT Duration 8 weeks   OT Treatment/Interventions Self-care/ADL training;Therapeutic exercise;Moist Heat;Neuromuscular education;Splinting;Therapeutic exercises;Patient/family education;DME and/or AE instruction;Manual Therapy;Passive range of motion;Therapeutic activities   Consulted and  Agree with Plan of Care Patient   Family Member Consulted mom      Patient will benefit from skilled therapeutic intervention in order to improve the following deficits and impairments:  Impaired flexibility, Pain, Decreased coordination, Impaired sensation, Decreased range of motion, Decreased strength, Impaired tone, Impaired UE functional use  Visit Diagnosis: Muscle weakness (generalized)    Problem List Patient Active Problem List   Diagnosis Date Noted  . Ureteral stone 10/29/2014  . Atypical chest pain 08/21/2014  . Malaise and fatigue 08/21/2014  . Morbid obesity ([place]) 08/21/2014  . H/O: CVA (cerebrovascular accident)   . Hx of seizure disorder   . History of echocardiogram   .  History of stress test   . Heart murmur   . Thyroid disease   . Clinical depression 12/12/2013  . Vitamin D deficiency 12/12/2013  . 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

## 2015-11-26 NOTE — Patient Instructions (Signed)
OT TREATMENT    Neuro muscular re-education:  Pt. Worked on active grasping, and releasing 1" objects. Pt. Worked with pegs, and stacking discs. Pt. Education was provided about weightbearing through her RUE and hand alternating between activity.  Therapeutic Exercise:  Pt. Worked on AAROM in all joint ranges of the RUE and hand. Self-ROM for the Right hand was reviewed with pt.    Manual Therapy:  Pt. Tolerated retrograde massage for edema control in her Right hand. And digits. Pt. Education was provided about edema control techniques.

## 2015-11-27 ENCOUNTER — Encounter: Payer: Self-pay | Admitting: Cardiovascular Disease

## 2015-11-27 ENCOUNTER — Ambulatory Visit (INDEPENDENT_AMBULATORY_CARE_PROVIDER_SITE_OTHER): Payer: Medicare Other | Admitting: Cardiovascular Disease

## 2015-11-27 VITALS — BP 120/80 | HR 70 | Ht 62.0 in | Wt 226.0 lb

## 2015-11-27 DIAGNOSIS — R569 Unspecified convulsions: Secondary | ICD-10-CM

## 2015-11-27 DIAGNOSIS — R0789 Other chest pain: Secondary | ICD-10-CM

## 2015-11-27 NOTE — Patient Instructions (Signed)

## 2015-11-27 NOTE — Progress Notes (Signed)
Cardiology Office Note  Date:  11/27/2015   ID:  Melissa Day, DOB Jun 17, 1970, MRN [id number]  PCP:  Tracie Harrier, MD   Chief Complaint  Patient presents with  . OTHER    1 yr f/u. Meds reviewed verbally with pt.    HPI:  45 y.o. female with H/O: CVA (cerebrovascular accident)   2012,  cerebral edema, craniotomy, residual right upper & lower limb weakness, seizure disorder, one episode 10 months after CVA, since then on Keppra  Hypothyroidism, presenting for malaise, follow-up of her chest discomfort Episode of chest discomfort in October 2015 with evaluation in the hospital at that time with normal echocardiogram and stress test  In follow-up today she is eating the wrong foods, Weight is up significantly, Family presents with her today reports that she eats rice, pop tarts, lots of [place] food No regular exercise program No further chest pain symptoms, otherwise has been feeling well with no complaints  EKG on today's visit shows normal sinus rhythm with rate 70 bpm, no significant ST or T-wave changes  Other past medical history reviewed Previously in the emergency room for general malaise. She told family she did not feel right for a couple of days.  had vertigo, GI upset, unclear if she had chest discomfort.  EKG and cardiac enzymes normal. D-dimer normal. scan of the head normal   she was discharged home ER notes suggest she had no chest pain symptoms for at least 10 days from the day of evaluation   12/01/2013: EF 50-55%, nl global LV systolic function, mild MR, mildly increased LV posterior wall thickness .History of stress test   12/01/2013: no significant ischemia, no significant WMA, no EKG changes concerning for ischemia, EF 67%, low risk scan  stroke in 2012 --> cerebral edema, craniotomy, right sided weakness that was treated at [facility]. She has remained on aspirin 81 mg since.  History of smoking up until her stroke  hospitalization at [facility] 11/2013 with  left sided chest pain that radiated to her left arm. She had recently used her arm to pull herself up some stairs with a pulley-like system. Troponin were negative. She underwent Lexiscan Myoview that was Low risk, no significant ischemia, no significant wall motion abnormalities, EF 67%. Echo showed EF of 50-55%, mild MR and was otherwise normal. Chest pain was felt to be atypical in etiology.   Cardiac event monitoring  reveals NSR.   PMH:   has a past medical history of Anemia; H/O: CVA (cerebrovascular accident) (2012); Heart murmur; High cholesterol; History of echocardiogram; History of stress test; seizure disorder; Hypothyroidism; Kidney stone; Seizures ([place]); Stroke [facility]); and Thyroid disease.  PSH:    Past Surgical History:  Procedure Laterality Date  . BRAIN SURGERY    . COLONOSCOPY WITH PROPOFOL N/A 11/18/2014   Procedure: COLONOSCOPY WITH PROPOFOL;  Surgeon: Josefine Class, MD;  Location: [facility] ENDOSCOPY;  Service: Endoscopy;  Laterality: N/A;  . CYSTOSCOPY/RETROGRADE/URETEROSCOPY/STONE EXTRACTION WITH BASKET Right 10/29/2014   Procedure: CYSTOSCOPY/URETEROSCOPY/STONE EXTRACTION WITH BASKET;  Surgeon: Franchot Gallo, MD;  Location: [facility] ORS;  Service: Urology;  Laterality: Right;  . ESOPHAGOGASTRODUODENOSCOPY (EGD) WITH PROPOFOL N/A 11/18/2014   Procedure: ESOPHAGOGASTRODUODENOSCOPY (EGD) WITH PROPOFOL;  Surgeon: Josefine Class, MD;  Location: [facility] ENDOSCOPY;  Service: Endoscopy;  Laterality: N/A;  . TONSILLECTOMY      Current Outpatient Prescriptions  Medication Sig Dispense Refill  . aspirin EC 81 MG tablet Take 81 mg by mouth daily.    Marland Kitchen atorvastatin (LIPITOR) 40 MG tablet Take  1 tablet (40 mg total) by mouth daily. 30 tablet 3  . Docosahexaenoic Acid (DHA OMEGA 3) 100 MG CAPS Take 100 mg by mouth daily.    . ferrous sulfate 325 (65 FE) MG tablet Take 325 mg by mouth daily.     Marland Kitchen FLUoxetine (PROZAC) 40 MG capsule Take 40 mg by mouth daily.    Marland Kitchen levETIRAcetam  (KEPPRA) 750 MG tablet Take 1 tablet (750 mg total) by mouth 2 (two) times daily. 28 tablet 0  . levothyroxine (SYNTHROID, LEVOTHROID) 112 MCG tablet Take 112 mcg by mouth daily before breakfast.     No current facility-administered medications for this visit.      Allergies:   Review of patient's allergies indicates no known allergies.   Social History:  The patient  reports that she has quit smoking. She has never used smokeless tobacco. She reports that she does not drink alcohol or use drugs.   Family History:   family history includes Breast cancer (age of onset: 25) in her sister; Breast cancer (age of onset: 96) in her mother; CVA in her father; Heart disease in her father; Nephrolithiasis in her maternal grandfather and mother.    Review of Systems: Review of Systems  Constitutional: Negative.        Weight gain  Respiratory: Negative.   Cardiovascular: Negative.   Gastrointestinal: Negative.   Musculoskeletal: Negative.   Neurological: Negative.   Psychiatric/Behavioral: Negative.   All other systems reviewed and are negative.    PHYSICAL EXAM: VS:  BP 120/80 (BP Location: Left Arm, Patient Position: Sitting, Cuff Size: Large)   Pulse 70   Ht 5\' 2"  (1.575 m)   Wt 226 lb (102.5 kg)   BMI 41.34 kg/m  , BMI Body mass index is 41.34 kg/m. GEN: Well nourished, well developed, in no acute distress, obese  HEENT: normal  Neck: no JVD, carotid bruits, or masses Cardiac: RRR; no murmurs, rubs, or gallops,no edema  Respiratory:  clear to auscultation bilaterally, normal work of breathing GI: soft, nontender, nondistended, + BS MS: no deformity or atrophy  Skin: warm and dry, no rash Neuro:  Strength and sensation are intact Psych: euthymic mood, full affect    Recent Labs: No results found for requested labs within last 8760 hours.    Lipid Panel Lab Results  Component Value Date   CHOL 152 12/01/2013   HDL 31 (L) 12/01/2013   LDLCALC 94 12/01/2013   TRIG 133  12/01/2013      Wt Readings from Last 3 Encounters:  11/27/15 226 lb (102.5 kg)  11/08/15 220 lb (99.8 kg)  10/29/14 215 lb 3.2 oz (97.6 kg)       ASSESSMENT AND PLAN:  Atypical chest pain - Plan: EKG 12-Lead No further workup at this time, currently not having symptoms  Morbid obesity ([place]) Long discussion concerning dietary changes Recommended she cut back on her high carbohydrate foods including pop tarts, rice Start regular walking program  Seizures ([place]) Maintained on Keppra   Total encounter time more than 15 minutes  Greater than 50% was spent in counseling and coordination of care with the patient     Disposition:   F/U  12 months   Orders Placed This Encounter  Procedures  . EKG 12-Lead     Signed, Esmond Plants, M.D., Ph.D. 11/27/2015  [place], [place]

## 2015-12-01 ENCOUNTER — Ambulatory Visit: Payer: Medicare Other | Admitting: Occupational Therapy

## 2015-12-03 ENCOUNTER — Ambulatory Visit: Payer: Medicare Other | Admitting: Occupational Therapy

## 2015-12-03 DIAGNOSIS — I69959 Hemiplegia and hemiparesis following unspecified cerebrovascular disease affecting unspecified side: Secondary | ICD-10-CM

## 2015-12-03 DIAGNOSIS — IMO0002 Reserved for concepts with insufficient information to code with codable children: Secondary | ICD-10-CM

## 2015-12-03 DIAGNOSIS — M6281 Muscle weakness (generalized): Secondary | ICD-10-CM

## 2015-12-03 DIAGNOSIS — Z8673 Personal history of transient ischemic attack (TIA), and cerebral infarction without residual deficits: Secondary | ICD-10-CM

## 2015-12-05 ENCOUNTER — Ambulatory Visit: Payer: Medicare Other | Admitting: Occupational Therapy

## 2015-12-05 DIAGNOSIS — M6281 Muscle weakness (generalized): Secondary | ICD-10-CM

## 2015-12-05 DIAGNOSIS — Z8673 Personal history of transient ischemic attack (TIA), and cerebral infarction without residual deficits: Secondary | ICD-10-CM

## 2015-12-05 DIAGNOSIS — I69959 Hemiplegia and hemiparesis following unspecified cerebrovascular disease affecting unspecified side: Secondary | ICD-10-CM

## 2015-12-05 DIAGNOSIS — IMO0002 Reserved for concepts with insufficient information to code with codable children: Secondary | ICD-10-CM

## 2015-12-07 ENCOUNTER — Encounter: Payer: Self-pay | Admitting: Occupational Therapy

## 2015-12-07 NOTE — Therapy (Addendum)
[place] MAIN [facility] SERVICES [address] [place], [place], [address] Phone: [phone]   Fax:  [phone]  Occupational Therapy Treatment  Patient Details  Name: Melissa Day MRN: [id number] Date of Birth: 10-14-1970 Referring Provider: Lottie Rater  Encounter Date: 12/03/2015      OT End of Session - 12/07/15 1234    Visit Number 4   Number of Visits 16   Date for OT Re-Evaluation 01/13/16   Authorization Type medicare G code 4   OT Start Time 0930   OT Stop Time 1016   OT Time Calculation (min) 46 min   Activity Tolerance Patient tolerated treatment well   Behavior During Therapy [facility] for tasks assessed/performed      Past Medical History:  Diagnosis Date  . Anemia   . H/O: CVA (cerebrovascular accident) 2012   a. cerebral edema, craniotomy, residual right upper & lower limb weakness  . Heart murmur   . High cholesterol   . History of echocardiogram    a. 12/01/2013: EF 50-55%, nl global LV systolic function, mild MR, mildly increased LV posterior wall thickness  . History of stress test    a. 12/01/2013: no significant ischemia, no significant WMA, no EKG changes concerning for ischemia, EF 67%, low risk scan  . Hx of seizure disorder    a. one episode 10 months after CVA, since then on Keppra   . Hypothyroidism   . Kidney stone   . Seizures ([place])   . Stroke ([place])   . Thyroid disease     Past Surgical History:  Procedure Laterality Date  . BRAIN SURGERY    . COLONOSCOPY WITH PROPOFOL N/A 11/18/2014   Procedure: COLONOSCOPY WITH PROPOFOL;  Surgeon: Josefine Class, MD;  [facility] ENDOSCOPY;  Service: Endoscopy;  Laterality: N/A;  . CYSTOSCOPY/RETROGRADE/URETEROSCOPY/STONE EXTRACTION WITH BASKET Right 10/29/2014   Procedure: CYSTOSCOPY/URETEROSCOPY/STONE EXTRACTION WITH BASKET;  Surgeon: Franchot Gallo, MD;  Location: [facility] ORS;  Service: Urology;  Laterality: Right;  .  ESOPHAGOGASTRODUODENOSCOPY (EGD) WITH PROPOFOL N/A 11/18/2014   Procedure: ESOPHAGOGASTRODUODENOSCOPY (EGD) WITH PROPOFOL;  Surgeon: Josefine Class, MD;  Location: [facility] ENDOSCOPY;  Service: Endoscopy;  Laterality: N/A;  . TONSILLECTOMY      There were no vitals filed for this visit.      Subjective Assessment - 12/07/15 1228    Subjective  Patient reports she is using her hand more, mom reports she has noticed patient's hand more relaxed and opening and closing more to pick up items.   Pertinent History Patient had a CVA about 5 years ago, recent increase in right UE for spasticity and went for Botox injections to help.  Has not been wearing splint at night for her hand which has affected her finger extension.   Patient Stated Goals Patient states she wants her hand to relax, be more useful like cutting meat/food.     Currently in Pain? No/denies   Pain Score 0-No pain                      OT Treatments/Exercises (OP) - 12/07/15 1229      Fine Motor Coordination   Other Fine Motor Exercises Patient seen for focus on fine motor coordination of the left hand as well as picking up and manipulation of objects, stacking of [place] discs up to 12, patient requires cues for technique, able to use the back of her right hand to readjust discs at times in the stack.  Neurological Re-education Exercises   Other Exercises 1 Patient seen for right hand ball web stretch with 2 sizes of balls with prolonged stretch with assist to place ball for optimal web stretch to help prepare the hand more for splint wear and functional use.  1# dowel exercise with therapist assistance for right hand grip, shoulder flexion, ABD/ADD, chest press, forwards/backwards circles, 10 reps for 2 sets.       Splinting   Splinting Patient brought in splint for reassessment, patient has not been wearing splint at night for a while now which is affecting her extension of her fingers on the right.   Adjustments made to the splint for the thumb area as well as reinstruction to patient for donning and doffing specifically making sure hand is up further and digits are within the finger separators.  Advised her she may need to keep her thumb out of the splint at this time until her fingers improve and we may reassess for another splint after she wears this one for a week or 2.  Patient instructed on alternative way to bring strap over MPs.  She demos understanding                OT Education - 12/07/15 1234    Education provided Yes   Education Details right hand functional use, coordination tasks.   Person(s) Educated Patient;Parent(s)   Methods Explanation;Demonstration;Verbal cues   Comprehension Verbal cues required;Returned demonstration;Verbalized understanding             OT Long Term Goals - 11/21/15 1208      OT LONG TERM GOAL #1   Title Patient will demonstrate ability to cut food with modified independence.     Baseline unable at eval   Time 8   Period Weeks   Status New     OT LONG TERM GOAL #2   Title Patient will improve active ROM of right hand to hold comb to manage the right side of her hair.    Baseline unable    Time 8   Period Weeks   Status New     OT LONG TERM GOAL #3   Title Patient will clip fingernails with modified independence using adaptive equipment as needed.    Baseline unable at eval    Time 8   Period Weeks   Status New     OT LONG TERM GOAL #4   Title Patient will complete shoe tying with modified independence.    Baseline unable at eval   Time 8   Period Weeks   Status New     OT LONG TERM GOAL #5   Title Patient will complete sweeping of the floor with bilateral use of UEs to help manage and control the broom with modified independence.    Baseline moderate difficulty to unable    Time 8   Period Weeks   Status New     Long Term Additional Goals   Additional Long Term Goals Yes     OT LONG TERM GOAL #6   Title  Patient will demonstrate techniques to manage a move larger, heavy kitchen items with modified techniques and independence.    Baseline unable to manage filled pot of water   Time 8   Period Weeks   Status New               Plan - 12/07/15 1235    Clinical Impression Statement Patient working towards increased active ROM of right hand and  increased ability to manipulate objects, focused this date on increasing range of motion at right thumb web space to provide a more functional grasp and to fit into splint with thumb in supportive position.  Patient and her mom report increased use of hand over the last couple of weeks.  Continue to work towards improved right hand use for necessary daily tasks.    Rehab Potential Good   OT Frequency 2x / week   OT Duration 8 weeks   OT Treatment/Interventions Self-care/ADL training;Therapeutic exercise;Moist Heat;Neuromuscular education;Splinting;Therapeutic exercises;Patient/family education;DME and/or AE instruction;Manual Therapy;Passive range of motion;Therapeutic activities   Consulted and Agree with Plan of Care Patient      Patient will benefit from skilled therapeutic intervention in order to improve the following deficits and impairments:  Impaired flexibility, Pain, Decreased coordination, Impaired sensation, Decreased range of motion, Decreased strength, Impaired tone, Impaired UE functional use  Visit Diagnosis: Muscle weakness (generalized)  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 Ureteral stone 10/29/2014  . Atypical chest pain 08/21/2014  . Malaise and fatigue 08/21/2014  . Morbid obesity ([place]) 08/21/2014  . H/O: CVA (cerebrovascular accident)   . Hx of seizure disorder   . History of echocardiogram   . History of stress test   . Heart murmur   .  Thyroid disease   . Clinical depression 12/12/2013  . Vitamin D deficiency 12/12/2013  . 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 SERVICES [address] [place], [place], [address] Phone: [phone]   Fax:  [phone]  Name: Melissa Day MRN: [id number] Date of Birth: July 15, 1970

## 2015-12-07 NOTE — Therapy (Addendum)
[place] MAIN [facility] SERVICES [address] [place], [place], [address] Phone: [phone]   Fax:  [phone]  Occupational Therapy Treatment  Patient Details  Name: Melissa Day MRN: [id number] Date of Birth: October 22, 1970 Referring Provider: Lottie Rater  Encounter Date: 12/05/2015      OT End of Session - 12/07/15 1403    Visit Number 5   Number of Visits 16   Date for OT Re-Evaluation 01/13/16   Authorization Type medicare G code 5   OT Start Time 1105   OT Stop Time 1200   OT Time Calculation (min) 55 min   Activity Tolerance Patient tolerated treatment well   Behavior During Therapy [facility] for tasks assessed/performed      Past Medical History:  Diagnosis Date  . Anemia   . H/O: CVA (cerebrovascular accident) 2012   a. cerebral edema, craniotomy, residual right upper & lower limb weakness  . Heart murmur   . High cholesterol   . History of echocardiogram    a. 12/01/2013: EF 50-55%, nl global LV systolic function, mild MR, mildly increased LV posterior wall thickness  . History of stress test    a. 12/01/2013: no significant ischemia, no significant WMA, no EKG changes concerning for ischemia, EF 67%, low risk scan  . Hx of seizure disorder    a. one episode 10 months after CVA, since then on Keppra   . Hypothyroidism   . Kidney stone   . Seizures ([place])   . Stroke ([place])   . Thyroid disease     Past Surgical History:  Procedure Laterality Date  . BRAIN SURGERY    . COLONOSCOPY WITH PROPOFOL N/A 11/18/2014   Procedure: COLONOSCOPY WITH PROPOFOL;  Surgeon: Josefine Class, MD;  Location: [facility] ENDOSCOPY;  Service: Endoscopy;  Laterality: N/A;  . CYSTOSCOPY/RETROGRADE/URETEROSCOPY/STONE EXTRACTION WITH BASKET Right 10/29/2014   Procedure: CYSTOSCOPY/URETEROSCOPY/STONE EXTRACTION WITH BASKET;  Surgeon: Franchot Gallo, MD;  Location: [facility] ORS;  Service: Urology;  Laterality: Right;  .  ESOPHAGOGASTRODUODENOSCOPY (EGD) WITH PROPOFOL N/A 11/18/2014   Procedure: ESOPHAGOGASTRODUODENOSCOPY (EGD) WITH PROPOFOL;  Surgeon: Josefine Class, MD;  Location: [facility] ENDOSCOPY;  Service: Endoscopy;  Laterality: N/A;  . TONSILLECTOMY      There were no vitals filed for this visit.      Subjective Assessment - 12/07/15 1402    Subjective  Patient reports she is doing well, wants to try making a smoothie next session, reports her doctor told her she needs to lose some weight and eat healthier.    Patient is accompained by: Family member   Patient Stated Goals Patient states she wants her hand to relax, be more useful like cutting meat/food.     Currently in Pain? No/denies                      OT Treatments/Exercises (OP) - 12/07/15 1419      Fine Motor Coordination   Other Fine Motor Exercises Patient seen for focus on fine motor coordination of the right hand as well as picking up and manipulation of objects, using the hand for storage and translatory skills of the hand.       Neurological Re-education Exercises   Other Exercises 1 Patient seen for gross grasp and release of objects with cues and facilitation of release.  Active reaching in multi directional planes of motion, cues for technique.       Splinting   Splinting Patient brought in splint for reassessment,  patient has not been wearing splint at night for a while now which is affecting her extension of her fingers on the right.  Adjustments made to the splint for the thumb area as well as reinstruction to patient for donning and doffing specifically making sure hand is up further and digits are within the finger separators.  Advised her she may need to keep her thumb out of the splint at this time until her fingers improve and we may reassess for another splint after she wears this one for a week or 2.  Patient instructed on alternative way to bring strap over MPs.  She demos understanding                 OT Education - 12/07/15 1403    Education provided Yes   Education Details HEP, coordination   Person(s) Educated Patient   Methods Explanation;Demonstration;Verbal cues   Comprehension Verbal cues required;Returned demonstration;Verbalized understanding             OT Long Term Goals - 11/21/15 1208      OT LONG TERM GOAL #1   Title Patient will demonstrate ability to cut food with modified independence.     Baseline unable at eval   Time 8   Period Weeks   Status New     OT LONG TERM GOAL #2   Title Patient will improve active ROM of right hand to hold comb to manage the right side of her hair.    Baseline unable    Time 8   Period Weeks   Status New     OT LONG TERM GOAL #3   Title Patient will clip fingernails with modified independence using adaptive equipment as needed.    Baseline unable at eval    Time 8   Period Weeks   Status New     OT LONG TERM GOAL #4   Title Patient will complete shoe tying with modified independence.    Baseline unable at eval   Time 8   Period Weeks   Status New     OT LONG TERM GOAL #5   Title Patient will complete sweeping of the floor with bilateral use of UEs to help manage and control the broom with modified independence.    Baseline moderate difficulty to unable    Time 8   Period Weeks   Status New     Long Term Additional Goals   Additional Long Term Goals Yes     OT LONG TERM GOAL #6   Title Patient will demonstrate techniques to manage a move larger, heavy kitchen items with modified techniques and independence.    Baseline unable to manage filled pot of water   Time 8   Period Weeks   Status New               Plan - 12/07/15 1404    Clinical Impression Statement Patient continues to progress wtih right hand use with emphasis on fine motor coordination, manipulation of objects and facilitation of increased active movement of the right hand after Botox injections to maximize therapeutic intervention.   Continue to work towards increased use of RUE in self care and IADL tasks.    Rehab Potential Good   OT Frequency 2x / week   OT Duration 8 weeks   OT Treatment/Interventions Self-care/ADL training;Therapeutic exercise;Moist Heat;Neuromuscular education;Splinting;Therapeutic exercises;Patient/family education;DME and/or AE instruction;Manual Therapy;Passive range of motion;Therapeutic activities   Consulted and Agree with Plan of Care Patient  Family Member Consulted mom      Patient will benefit from skilled therapeutic intervention in order to improve the following deficits and impairments:  Impaired flexibility, Pain, Decreased coordination, Impaired sensation, Decreased range of motion, Decreased strength, Impaired tone, Impaired UE functional use  Visit Diagnosis: Muscle weakness (generalized)  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 Ureteral stone 10/29/2014  . Atypical chest pain 08/21/2014  . Malaise and fatigue 08/21/2014  . Morbid obesity ([place]) 08/21/2014  . H/O: CVA (cerebrovascular accident)   . Hx of seizure disorder   . History of echocardiogram   . History of stress test   . Heart murmur   . Thyroid disease   . Clinical depression 12/12/2013  . Vitamin D deficiency 12/12/2013  . 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 [place], [place], [address] Phone: [phone]   Fax:  [phone]  Name: Melissa Day MRN: [id number] Date of Birth: November 12, 1970

## 2015-12-08 ENCOUNTER — Encounter: Payer: Medicare Other | Admitting: Occupational Therapy

## 2015-12-10 ENCOUNTER — Encounter: Payer: Medicare Other | Admitting: Occupational Therapy

## 2015-12-11 ENCOUNTER — Ambulatory Visit: Payer: Medicare Other | Attending: Rehabilitation | Admitting: Occupational Therapy

## 2015-12-11 DIAGNOSIS — Z8673 Personal history of transient ischemic attack (TIA), and cerebral infarction without residual deficits: Secondary | ICD-10-CM

## 2015-12-11 DIAGNOSIS — I639 Cerebral infarction, unspecified: Secondary | ICD-10-CM | POA: Diagnosis present

## 2015-12-11 DIAGNOSIS — R279 Unspecified lack of coordination: Secondary | ICD-10-CM | POA: Diagnosis present

## 2015-12-11 DIAGNOSIS — M6281 Muscle weakness (generalized): Secondary | ICD-10-CM | POA: Diagnosis not present

## 2015-12-11 DIAGNOSIS — I69959 Hemiplegia and hemiparesis following unspecified cerebrovascular disease affecting unspecified side: Secondary | ICD-10-CM | POA: Insufficient documentation

## 2015-12-11 DIAGNOSIS — IMO0002 Reserved for concepts with insufficient information to code with codable children: Secondary | ICD-10-CM

## 2015-12-12 ENCOUNTER — Ambulatory Visit: Payer: Medicare Other | Admitting: Occupational Therapy

## 2015-12-15 ENCOUNTER — Ambulatory Visit: Payer: Medicare Other | Admitting: Occupational Therapy

## 2015-12-15 DIAGNOSIS — Z8673 Personal history of transient ischemic attack (TIA), and cerebral infarction without residual deficits: Secondary | ICD-10-CM

## 2015-12-15 DIAGNOSIS — IMO0002 Reserved for concepts with insufficient information to code with codable children: Secondary | ICD-10-CM

## 2015-12-15 DIAGNOSIS — I69959 Hemiplegia and hemiparesis following unspecified cerebrovascular disease affecting unspecified side: Secondary | ICD-10-CM

## 2015-12-15 DIAGNOSIS — M6281 Muscle weakness (generalized): Secondary | ICD-10-CM

## 2015-12-17 ENCOUNTER — Ambulatory Visit: Payer: Medicare Other | Admitting: Occupational Therapy

## 2015-12-17 DIAGNOSIS — M6281 Muscle weakness (generalized): Secondary | ICD-10-CM

## 2015-12-17 DIAGNOSIS — IMO0002 Reserved for concepts with insufficient information to code with codable children: Secondary | ICD-10-CM

## 2015-12-17 DIAGNOSIS — I69959 Hemiplegia and hemiparesis following unspecified cerebrovascular disease affecting unspecified side: Secondary | ICD-10-CM

## 2015-12-18 ENCOUNTER — Encounter: Payer: Self-pay | Admitting: Occupational Therapy

## 2015-12-18 NOTE — Therapy (Signed)
[place] MAIN [facility] SERVICES [address] [place], [place], [address] Phone: [phone]   Fax:  [phone]  Occupational Therapy Treatment  Patient Details  Name: Melissa Day MRN: [id number] Date of Birth: 07/03/1970 Referring Provider: Lottie Rater  Encounter Date: 12/11/2015      OT End of Session - 12/18/15 2110    Visit Number 6   Number of Visits 16   Date for OT Re-Evaluation 01/13/16   Authorization Type medicare G code 6   OT Start Time [id number]   OT Stop Time 1230   OT Time Calculation (min) 54 min   Activity Tolerance Patient tolerated treatment well   Behavior During Therapy [facility] for tasks assessed/performed      Past Medical History:  Diagnosis Date  . Anemia   . H/O: CVA (cerebrovascular accident) 2012   a. cerebral edema, craniotomy, residual right upper & lower limb weakness  . Heart murmur   . High cholesterol   . History of echocardiogram    a. 12/01/2013: EF 50-55%, nl global LV systolic function, mild MR, mildly increased LV posterior wall thickness  . History of stress test    a. 12/01/2013: no significant ischemia, no significant WMA, no EKG changes concerning for ischemia, EF 67%, low risk scan  . Hx of seizure disorder    a. one episode 10 months after CVA, since then on Keppra   . Hypothyroidism   . Kidney stone   . Seizures ([place])   . Stroke ([place])   . Thyroid disease     Past Surgical History:  Procedure Laterality Date  . BRAIN SURGERY    . COLONOSCOPY WITH PROPOFOL N/A 11/18/2014   Procedure: COLONOSCOPY WITH PROPOFOL;  Surgeon: Josefine Class, MD;  Location: [facility] ENDOSCOPY;  Service: Endoscopy;  Laterality: N/A;  . CYSTOSCOPY/RETROGRADE/URETEROSCOPY/STONE EXTRACTION WITH BASKET Right 10/29/2014   Procedure: CYSTOSCOPY/URETEROSCOPY/STONE EXTRACTION WITH BASKET;  Surgeon: Franchot Gallo, MD;  Location: [facility] ORS;  Service: Urology;  Laterality: Right;  .  ESOPHAGOGASTRODUODENOSCOPY (EGD) WITH PROPOFOL N/A 11/18/2014   Procedure: ESOPHAGOGASTRODUODENOSCOPY (EGD) WITH PROPOFOL;  Surgeon: Josefine Class, MD;  Location: [facility] ENDOSCOPY;  Service: Endoscopy;  Laterality: N/A;  . TONSILLECTOMY      There were no vitals filed for this visit.      Subjective Assessment - 12/18/15 2108    Subjective  Patient reports she was visiting her cousin in the hospital and got lost on the way from his room to therapy gym and it took her longer to get here.  "Don't tell my mama!"   Pertinent History Patient had a CVA about 5 years ago, recent increase in right UE for spasticity and went for Botox injections to help.  Has not been wearing splint at night for her hand which has affected her finger extension.   Patient Stated Goals Patient states she wants her hand to relax, be more useful like cutting meat/food.     Currently in Pain? No/denies   Pain Score 0-No pain                      OT Treatments/Exercises (OP) - 12/18/15 2114      Fine Motor Coordination   Other Fine Motor Exercises Patient seen for focus on fine motor coordination of the right hand as well as picking up and manipulation of objects, using the hand for storage and translatory skills of the hand.  Patient demonstrates difficulty with isolated finger movements of the  right hand for manipulation skills.      Neurological Re-education Exercises   Other Exercises 1 Patient seen for facilitation of gross grasp and release of various sized objects with cues for finger extension, manual assistance to obtain oppositional movement of the thumb and index to place right hand around object.       Splinting   Splinting --                OT Education - 12/18/15 2109    Education provided Yes   Education Details prehension patterns, ROM to hand with thumb web space and weight bearing   Person(s) Educated Patient   Methods Explanation;Demonstration;Verbal cues    Comprehension Verbal cues required;Returned demonstration;Verbalized understanding             OT Long Term Goals - 11/21/15 1208      OT LONG TERM GOAL #1   Title Patient will demonstrate ability to cut food with modified independence.     Baseline unable at eval   Time 8   Period Weeks   Status New     OT LONG TERM GOAL #2   Title Patient will improve active ROM of right hand to hold comb to manage the right side of her hair.    Baseline unable    Time 8   Period Weeks   Status New     OT LONG TERM GOAL #3   Title Patient will clip fingernails with modified independence using adaptive equipment as needed.    Baseline unable at eval    Time 8   Period Weeks   Status New     OT LONG TERM GOAL #4   Title Patient will complete shoe tying with modified independence.    Baseline unable at eval   Time 8   Period Weeks   Status New     OT LONG TERM GOAL #5   Title Patient will complete sweeping of the floor with bilateral use of UEs to help manage and control the broom with modified independence.    Baseline moderate difficulty to unable    Time 8   Period Weeks   Status New     Long Term Additional Goals   Additional Long Term Goals Yes     OT LONG TERM GOAL #6   Title Patient will demonstrate techniques to manage a move larger, heavy kitchen items with modified techniques and independence.    Baseline unable to manage filled pot of water   Time 8   Period Weeks   Status New               Plan - 12/18/15 2111    Clinical Impression Statement Patient came from acute care room in the hospital and got lost trying to find rehab, therapist worked with patient on navigating her way back to her cousin's room where her mom was waiting for her.  Patient with increased spasticity in right hand this date and requires frequent breaks to stretch hand and perform weight bearing between attempts at using the hand for functional prehension patterns for manipulation of  objects.    Rehab Potential Good   OT Frequency 2x / week   OT Duration 8 weeks   OT Treatment/Interventions Self-care/ADL training;Therapeutic exercise;Moist Heat;Neuromuscular education;Splinting;Therapeutic exercises;Patient/family education;DME and/or AE instruction;Manual Therapy;Passive range of motion;Therapeutic activities   Consulted and Agree with Plan of Care Patient      Patient will benefit from skilled therapeutic intervention in order to  improve the following deficits and impairments:  Impaired flexibility, Pain, Decreased coordination, Impaired sensation, Decreased range of motion, Decreased strength, Impaired tone, Impaired UE functional use  Visit Diagnosis: Muscle weakness (generalized)  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 Ureteral stone 10/29/2014  . Atypical chest pain 08/21/2014  . Malaise and fatigue 08/21/2014  . Morbid obesity ([place]) 08/21/2014  . H/O: CVA (cerebrovascular accident)   . Hx of seizure disorder   . History of echocardiogram   . History of stress test   . Heart murmur   . Thyroid disease   . Clinical depression 12/12/2013  . Vitamin D deficiency 12/12/2013  . 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) 11/18/2010   Elainah Rhyne T Tomasita Morrow, OTR/L, CLT  Daneil Beem 12/18/2015, 9:28 PM  [place] MAIN [facility] SERVICES [address] [place], [place], [address] Phone: [phone]   Fax:  [phone]  Name: Melissa Day MRN: [id number] Date of Birth: 12-Jun-1970

## 2015-12-19 ENCOUNTER — Encounter: Payer: Self-pay | Admitting: Occupational Therapy

## 2015-12-19 NOTE — Therapy (Signed)
[place] MAIN [facility] SERVICES [address] [place], [place], [address] Phone: [phone]   Fax:  [phone]  Occupational Therapy Treatment  Patient Details  Name: Melissa Day MRN: [id number] Date of Birth: 24-Jun-1970 Referring Provider: Lottie Rater  Encounter Date: 12/17/2015      OT End of Session - 12/19/15 2031    Visit Number 8   Number of Visits 16   Date for OT Re-Evaluation 01/13/16   Authorization Type medicare G code 8   OT Start Time 1015   OT Stop Time 1100   OT Time Calculation (min) 45 min   Activity Tolerance Patient tolerated treatment well   Behavior During Therapy [facility] for tasks assessed/performed      Past Medical History:  Diagnosis Date  . Anemia   . H/O: CVA (cerebrovascular accident) 2012   a. cerebral edema, craniotomy, residual right upper & lower limb weakness  . Heart murmur   . High cholesterol   . History of echocardiogram    a. 12/01/2013: EF 50-55%, nl global LV systolic function, mild MR, mildly increased LV posterior wall thickness  . History of stress test    a. 12/01/2013: no significant ischemia, no significant WMA, no EKG changes concerning for ischemia, EF 67%, low risk scan  . Hx of seizure disorder    a. one episode 10 months after CVA, since then on Keppra   . Hypothyroidism   . Kidney stone   . Seizures ([place])   . Stroke ([place])   . Thyroid disease     Past Surgical History:  Procedure Laterality Date  . BRAIN SURGERY    . COLONOSCOPY WITH PROPOFOL N/A 11/18/2014   Procedure: COLONOSCOPY WITH PROPOFOL;  Surgeon: Josefine Class, MD;  Location: [facility] ENDOSCOPY;  Service: Endoscopy;  Laterality: N/A;  . CYSTOSCOPY/RETROGRADE/URETEROSCOPY/STONE EXTRACTION WITH BASKET Right 10/29/2014   Procedure: CYSTOSCOPY/URETEROSCOPY/STONE EXTRACTION WITH BASKET;  Surgeon: Franchot Gallo, MD;  Location: [facility] ORS;  Service: Urology;  Laterality: Right;  .  ESOPHAGOGASTRODUODENOSCOPY (EGD) WITH PROPOFOL N/A 11/18/2014   Procedure: ESOPHAGOGASTRODUODENOSCOPY (EGD) WITH PROPOFOL;  Surgeon: Josefine Class, MD;  Location: [facility] ENDOSCOPY;  Service: Endoscopy;  Laterality: N/A;  . TONSILLECTOMY      There were no vitals filed for this visit.      Subjective Assessment - 12/19/15 2026    Subjective  Patient reports she is doing well, mom reports patient is in "rare form" today, didn't want to get up.   Patient is accompained by: Family member   Pertinent History Patient had a CVA about 5 years ago, recent increase in right UE for spasticity and went for Botox injections to help.  Has not been wearing splint at night for her hand which has affected her finger extension.   Patient Stated Goals Patient states she wants her hand to relax, be more useful like cutting meat/food.     Currently in Pain? No/denies   Pain Score 0-No pain   Multiple Pain Sites No                      OT Treatments/Exercises (OP) - 12/19/15 2027      Fine Motor Coordination   Other Fine Motor Exercises Patient seen for focus on fine motor coordination of the right hand as well as picking up and manipulation of objects, using the hand for storage and translatory skills of the hand.  Patient demonstrates difficulty with isolated finger movements of the right hand  for manipulation skills. Manipulation of glass beads, some with flat side towards table, other ones with flat side up.  Cues for prehension patterns to pick up, move towards palm, using the hand for storage and moving back out to fingertips.      Neurological Re-education Exercises   Other Exercises 1 Patient seen for dowel ladder exercise with therapist assist to place right hand onto dowel and for maintaining grasp.  Moving rung to rung with guidance, up and down for multiple trials.  Resistive pinch with yellow pinch pins for lateral pinch with assist to place right thumb correctly on pin, cues for  release.                 OT Education - 12/19/15 2031    Education provided Yes   Education Details R hand use in daily tasks.   Person(s) Educated Patient   Methods Explanation;Demonstration;Verbal cues   Comprehension Verbal cues required;Returned demonstration;Verbalized understanding             OT Long Term Goals - 11/21/15 1208      OT LONG TERM GOAL #1   Title Patient will demonstrate ability to cut food with modified independence.     Baseline unable at eval   Time 8   Period Weeks   Status New     OT LONG TERM GOAL #2   Title Patient will improve active ROM of right hand to hold comb to manage the right side of her hair.    Baseline unable    Time 8   Period Weeks   Status New     OT LONG TERM GOAL #3   Title Patient will clip fingernails with modified independence using adaptive equipment as needed.    Baseline unable at eval    Time 8   Period Weeks   Status New     OT LONG TERM GOAL #4   Title Patient will complete shoe tying with modified independence.    Baseline unable at eval   Time 8   Period Weeks   Status New     OT LONG TERM GOAL #5   Title Patient will complete sweeping of the floor with bilateral use of UEs to help manage and control the broom with modified independence.    Baseline moderate difficulty to unable    Time 8   Period Weeks   Status New     Long Term Additional Goals   Additional Long Term Goals Yes     OT LONG TERM GOAL #6   Title Patient will demonstrate techniques to manage a move larger, heavy kitchen items with modified techniques and independence.    Baseline unable to manage filled pot of water   Time [place] - 12/19/15 2031    Clinical Impression Statement Patient continues to work towards improved functional use of right hand for necessary daily exercises and continues to benefit from skilled OT to provide cues, guiding and instruction along with  facilitation of movement for right hand.     Rehab Potential Good   OT Frequency 2x / week   OT Duration 8 weeks   OT Treatment/Interventions Self-care/ADL training;Therapeutic exercise;Moist Heat;Neuromuscular education;Splinting;Therapeutic exercises;Patient/family education;DME and/or AE instruction;Manual Therapy;Passive range of motion;Therapeutic activities   Consulted and Agree with Plan of Care Patient      Patient will benefit from skilled  therapeutic intervention in order to improve the following deficits and impairments:  Impaired flexibility, Pain, Decreased coordination, Impaired sensation, Decreased range of motion, Decreased strength, Impaired tone, Impaired UE functional use  Visit Diagnosis: Muscle weakness (generalized)  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 Ureteral stone 10/29/2014  . Atypical chest pain 08/21/2014  . Malaise and fatigue 08/21/2014  . Morbid obesity ([place]) 08/21/2014  . H/O: CVA (cerebrovascular accident)   . Hx of seizure disorder   . History of echocardiogram   . History of stress test   . Heart murmur   . Thyroid disease   . Clinical depression 12/12/2013  . Vitamin D deficiency 12/12/2013  . 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

## 2015-12-19 NOTE — Therapy (Signed)
[place] MAIN [facility] SERVICES [address] [place], [place], [address] Phone: [phone]   Fax:  [phone]  Occupational Therapy Treatment  Patient Details  Name: Melissa Day MRN: [id number] Date of Birth: 1970/02/29 Referring Provider: Lottie Rater  Encounter Date: 12/15/2015      OT End of Session - 12/19/15 1726    Visit Number 7   Number of Visits 16   Date for OT Re-Evaluation 01/13/16   Authorization Type medicare G code 7   OT Start Time 0859   OT Stop Time 0945   OT Time Calculation (min) 46 min   Activity Tolerance Patient tolerated treatment well   Behavior During Therapy [facility] for tasks assessed/performed      Past Medical History:  Diagnosis Date  . Anemia   . H/O: CVA (cerebrovascular accident) 2012   a. cerebral edema, craniotomy, residual right upper & lower limb weakness  . Heart murmur   . High cholesterol   . History of echocardiogram    a. 12/01/2013: EF 50-55%, nl global LV systolic function, mild MR, mildly increased LV posterior wall thickness  . History of stress test    a. 12/01/2013: no significant ischemia, no significant WMA, no EKG changes concerning for ischemia, EF 67%, low risk scan  . Hx of seizure disorder    a. one episode 10 months after CVA, since then on Keppra   . Hypothyroidism   . Kidney stone   . Seizures ([place])   . Stroke ([place])   . Thyroid disease     Past Surgical History:  Procedure Laterality Date  . BRAIN SURGERY    . COLONOSCOPY WITH PROPOFOL N/A 11/18/2014   Procedure: COLONOSCOPY WITH PROPOFOL;  Surgeon: Josefine Class, MD;  Location: [facility] ENDOSCOPY;  Service: Endoscopy;  Laterality: N/A;  . CYSTOSCOPY/RETROGRADE/URETEROSCOPY/STONE EXTRACTION WITH BASKET Right 10/29/2014   Procedure: CYSTOSCOPY/URETEROSCOPY/STONE EXTRACTION WITH BASKET;  Surgeon: Franchot Gallo, MD;  Location: [facility] ORS;  Service: Urology;  Laterality: Right;  .  ESOPHAGOGASTRODUODENOSCOPY (EGD) WITH PROPOFOL N/A 11/18/2014   Procedure: ESOPHAGOGASTRODUODENOSCOPY (EGD) WITH PROPOFOL;  Surgeon: Josefine Class, MD;  Location: [facility] ENDOSCOPY;  Service: Endoscopy;  Laterality: N/A;  . TONSILLECTOMY      There were no vitals filed for this visit.      Subjective Assessment - 12/19/15 1701    Subjective  Mom with patient today in tx session and states, "I am sorry about Friday, Kessie told me she got lost trying to come down here."     Patient is accompained by: Family member   Pertinent History Patient had a CVA about 5 years ago, recent increase in right UE for spasticity and went for Botox injections to help.  Has not been wearing splint at night for her hand which has affected her finger extension.   Patient Stated Goals Patient states she wants her hand to relax, be more useful like cutting meat/food.     Currently in Pain? No/denies   Pain Score 0-No pain                      OT Treatments/Exercises (OP) - 12/19/15 1721      ADLs   ADL Comments Patient seen for gross grasp and release of cup, water bottle, medicine bottle, deodorant with cues for thumb ABD and finger extension, manual assist at times to facilitate opening to place items in hand.      Fine Motor Coordination   Other Fine  Motor Exercises Patient seen for focus on fine motor coordination of the right hand as well as picking up and manipulation of objects, using the hand for storage and translatory skills of the hand.  Patient demonstrates difficulty with isolated finger movements of the right hand for manipulation skills. Manipulation of cylindrical pegs of 100 peg board to pick up and move from one place to another, attempts to turn and flip item to opposite side with difficulty.       Neurological Re-education Exercises   Other Exercises 1 SAEBO tower in standing with looped balls to move from one level to the next with verbal cues, occasional guiding for up to  3 levels out of the 4.  Multiple trials performed.                 OT Education - 12/19/15 1725    Education provided Yes   Education Details ROM of right hand, functional hand activities.    Person(s) Educated Patient   Methods Explanation;Demonstration;Verbal cues   Comprehension Verbal cues required;Returned demonstration;Verbalized understanding             OT Long Term Goals - 11/21/15 1208      OT LONG TERM GOAL #1   Title Patient will demonstrate ability to cut food with modified independence.     Baseline unable at eval   Time 8   Period Weeks   Status New     OT LONG TERM GOAL #2   Title Patient will improve active ROM of right hand to hold comb to manage the right side of her hair.    Baseline unable    Time 8   Period Weeks   Status New     OT LONG TERM GOAL #3   Title Patient will clip fingernails with modified independence using adaptive equipment as needed.    Baseline unable at eval    Time 8   Period Weeks   Status New     OT LONG TERM GOAL #4   Title Patient will complete shoe tying with modified independence.    Baseline unable at eval   Time 8   Period Weeks   Status New     OT LONG TERM GOAL #5   Title Patient will complete sweeping of the floor with bilateral use of UEs to help manage and control the broom with modified independence.    Baseline moderate difficulty to unable    Time 8   Period Weeks   Status New     Long Term Additional Goals   Additional Long Term Goals Yes     OT LONG TERM GOAL #6   Title Patient will demonstrate techniques to manage a move larger, heavy kitchen items with modified techniques and independence.    Baseline unable to manage filled pot of water   Time 8   Period Weeks   Status New               Plan - 12/19/15 1726    Clinical Impression Statement Patient attempting to utilize right hand more at home, reports increased consistency in wearing splint at night but still has difficulty  with thumb positioning.  Patient does demonstrate increased spasticity in right hand despite recent botox injections and requires assistance for web stretch, facilitation of opening of the hand to place onto items. Continue to work towards improving right hand use for daily tasks.   Rehab Potential Good   OT Frequency 2x / week  OT Duration 8 weeks   OT Treatment/Interventions Self-care/ADL training;Therapeutic exercise;Moist Heat;Neuromuscular education;Splinting;Therapeutic exercises;Patient/family education;DME and/or AE instruction;Manual Therapy;Passive range of motion;Therapeutic activities   Consulted and Agree with Plan of Care Patient      Patient will benefit from skilled therapeutic intervention in order to improve the following deficits and impairments:  Impaired flexibility, Pain, Decreased coordination, Impaired sensation, Decreased range of motion, Decreased strength, Impaired tone, Impaired UE functional use  Visit Diagnosis: Muscle weakness (generalized)  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 Ureteral stone 10/29/2014  . Atypical chest pain 08/21/2014  . Malaise and fatigue 08/21/2014  . Morbid obesity ([place]) 08/21/2014  . H/O: CVA (cerebrovascular accident)   . Hx of seizure disorder   . History of echocardiogram   . History of stress test   . Heart murmur   . Thyroid disease   . Clinical depression 12/12/2013  . Vitamin D deficiency 12/12/2013  . 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

## 2015-12-22 ENCOUNTER — Ambulatory Visit: Payer: Medicare Other | Admitting: Occupational Therapy

## 2015-12-24 ENCOUNTER — Encounter: Payer: Medicare Other | Admitting: Occupational Therapy

## 2015-12-25 ENCOUNTER — Encounter: Payer: Medicare Other | Admitting: Occupational Therapy

## 2015-12-29 ENCOUNTER — Ambulatory Visit: Payer: Medicare Other | Admitting: Occupational Therapy

## 2015-12-29 ENCOUNTER — Encounter: Payer: Self-pay | Admitting: Occupational Therapy

## 2015-12-29 DIAGNOSIS — Z8673 Personal history of transient ischemic attack (TIA), and cerebral infarction without residual deficits: Secondary | ICD-10-CM

## 2015-12-29 DIAGNOSIS — M6281 Muscle weakness (generalized): Secondary | ICD-10-CM | POA: Diagnosis not present

## 2015-12-29 DIAGNOSIS — I69959 Hemiplegia and hemiparesis following unspecified cerebrovascular disease affecting unspecified side: Secondary | ICD-10-CM

## 2015-12-29 DIAGNOSIS — IMO0002 Reserved for concepts with insufficient information to code with codable children: Secondary | ICD-10-CM

## 2015-12-31 ENCOUNTER — Ambulatory Visit: Payer: Medicare Other | Admitting: Occupational Therapy

## 2015-12-31 ENCOUNTER — Encounter: Payer: Self-pay | Admitting: Occupational Therapy

## 2015-12-31 DIAGNOSIS — M6281 Muscle weakness (generalized): Secondary | ICD-10-CM

## 2015-12-31 DIAGNOSIS — Z8673 Personal history of transient ischemic attack (TIA), and cerebral infarction without residual deficits: Secondary | ICD-10-CM

## 2015-12-31 DIAGNOSIS — I69959 Hemiplegia and hemiparesis following unspecified cerebrovascular disease affecting unspecified side: Secondary | ICD-10-CM

## 2015-12-31 DIAGNOSIS — IMO0002 Reserved for concepts with insufficient information to code with codable children: Secondary | ICD-10-CM

## 2015-12-31 NOTE — Therapy (Signed)
[place] MAIN [facility] SERVICES [address] [place], [place], [address] Phone: [phone]   Fax:  [phone]  Occupational Therapy Treatment  Patient Details  Name: Melissa Day MRN: [id number] Date of Birth: [birth year] Referring Provider: Lottie Rater  Encounter Date: 12/29/2015      OT End of Session - 12/31/15 1124    Visit Number 9   Number of Visits 16   Date for OT Re-Evaluation 01/13/16   Authorization Type medicare G code 9   OT Start Time 1015   OT Stop Time 1059   OT Time Calculation (min) 44 min   Activity Tolerance Patient tolerated treatment well   Behavior During Therapy [facility] for tasks assessed/performed      Past Medical History:  Diagnosis Date  . Anemia   . H/O: CVA (cerebrovascular accident) 2012   a. cerebral edema, craniotomy, residual right upper & lower limb weakness  . Heart murmur   . High cholesterol   . History of echocardiogram    a. 12/01/2013: EF 50-55%, nl global LV systolic function, mild MR, mildly increased LV posterior wall thickness  . History of stress test    a. 12/01/2013: no significant ischemia, no significant WMA, no EKG changes concerning for ischemia, EF 67%, low risk scan  . Hx of seizure disorder    a. one episode 10 months after CVA, since then on Keppra   . Hypothyroidism   . Kidney stone   . Seizures ([place])   . Stroke ([place])   . Thyroid disease     Past Surgical History:  Procedure Laterality Date  . BRAIN SURGERY    . COLONOSCOPY WITH PROPOFOL N/A 11/18/2014   Procedure: COLONOSCOPY WITH PROPOFOL;  Surgeon: Josefine Class, MD;  Location: [facility] ENDOSCOPY;  Service: Endoscopy;  Laterality: N/A;  . CYSTOSCOPY/RETROGRADE/URETEROSCOPY/STONE EXTRACTION WITH BASKET Right 10/29/2014   Procedure: CYSTOSCOPY/URETEROSCOPY/STONE EXTRACTION WITH BASKET;  Surgeon: Franchot Gallo, MD;  Location: [facility] ORS;  Service: Urology;  Laterality: Right;  .  ESOPHAGOGASTRODUODENOSCOPY (EGD) WITH PROPOFOL N/A 11/18/2014   Procedure: ESOPHAGOGASTRODUODENOSCOPY (EGD) WITH PROPOFOL;  Surgeon: Josefine Class, MD;  Location: [facility] ENDOSCOPY;  Service: Endoscopy;  Laterality: N/A;  . TONSILLECTOMY      There were no vitals filed for this visit.      Subjective Assessment - 12/31/15 1121    Subjective  Patient reports she feels her hand is tightening more and does not feel Botox has been working as well as it has in the past, she is planning to get another round of Botox in January.  She is trying to eat healthier since going to the doctor and wants to try to work on making a healthy smoothie with fruits and vegetables.     Patient is accompained by: Family member   Pertinent History Patient had a CVA about 5 years ago, recent increase in right UE for spasticity and went for Botox injections to help.  Has not been wearing splint at night for her hand which has affected her finger extension.   Patient Stated Goals Patient states she wants her hand to relax, be more useful like cutting meat/food.     Currently in Pain? No/denies   Pain Score 0-No pain                      OT Treatments/Exercises (OP) - 12/31/15 1802      Fine Motor Coordination   Other Fine Motor Exercises Patient seen for focus  on fine motor coordination of the right hand as well as picking up and manipulation of objects, using the hand for storage and translatory skills of the hand.  Patient demonstrates difficulty with isolated finger movements of the right hand for manipulation skills. Manipulation of glass beads, some with flat side towards table, other ones with flat side up.  Cues for prehension patterns to pick up, move towards palm, using the hand for storage and moving back out to fingertips.      Neurological Re-education Exercises   Other Exercises 1 Patient seen for PROM of right hand, all digits, wrist in preparation for use in functional tasks.  Weight  bearing to right hand to promote finger extension.  Putty for finger extension, cues to not hyperextend.                  OT Education - 12/31/15 1123    Education provided Yes   Education Details HEP, functional hand use.   Person(s) Educated Patient   Methods Explanation;Demonstration;Verbal cues   Comprehension Verbal cues required;Returned demonstration;Verbalized understanding             OT Long Term Goals - 11/21/15 1208      OT LONG TERM GOAL #1   Title Patient will demonstrate ability to cut food with modified independence.     Baseline unable at eval   Time 8   Period Weeks   Status New     OT LONG TERM GOAL #2   Title Patient will improve active ROM of right hand to hold comb to manage the right side of her hair.    Baseline unable    Time 8   Period Weeks   Status New     OT LONG TERM GOAL #3   Title Patient will clip fingernails with modified independence using adaptive equipment as needed.    Baseline unable at eval    Time 8   Period Weeks   Status New     OT LONG TERM GOAL #4   Title Patient will complete shoe tying with modified independence.    Baseline unable at eval   Time 8   Period Weeks   Status New     OT LONG TERM GOAL #5   Title Patient will complete sweeping of the floor with bilateral use of UEs to help manage and control the broom with modified independence.    Baseline moderate difficulty to unable    Time 8   Period Weeks   Status New     Long Term Additional Goals   Additional Long Term Goals Yes     OT LONG TERM GOAL #6   Title Patient will demonstrate techniques to manage a move larger, heavy kitchen items with modified techniques and independence.    Baseline unable to manage filled pot of water   Time 8   Period Weeks   Status New               Plan - 12/31/15 1125    Clinical Impression Statement Patient requested to work on fine motor coordination tasks of manipulation of beads, cues for picking  up with prehension patterns, translatory movements of the hand as well as using the hand for storage.  She is requesting her mom take her to the store to purchase glass beads to work on at home.  Planning to work on making smoothies next session.     Rehab Potential Good   OT Frequency 2x /  week   OT Duration 8 weeks   OT Treatment/Interventions Self-care/ADL training;Therapeutic exercise;Moist Heat;Neuromuscular education;Splinting;Therapeutic exercises;Patient/family education;DME and/or AE instruction;Manual Therapy;Passive range of motion;Therapeutic activities   Consulted and Agree with Plan of Care Patient      Patient will benefit from skilled therapeutic intervention in order to improve the following deficits and impairments:  Impaired flexibility, Pain, Decreased coordination, Impaired sensation, Decreased range of motion, Decreased strength, Impaired tone, Impaired UE functional use  Visit Diagnosis: Muscle weakness (generalized)  H/O: CVA (cerebrovascular accident)  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 Ureteral stone 10/29/2014  . Atypical chest pain 08/21/2014  . Malaise and fatigue 08/21/2014  . Morbid obesity ([place]) 08/21/2014  . H/O: CVA (cerebrovascular accident)   . Hx of seizure disorder   . History of echocardiogram   . History of stress test   . Heart murmur   . Thyroid disease   . Clinical depression 12/12/2013  . Vitamin D deficiency 12/12/2013  . 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 [place], [place], [address] Phone: [phone]   Fax:  [phone]  Name: KACE ASEBEDO MRN: [id number] Date of Birth: [birth year]

## 2015-12-31 NOTE — Therapy (Signed)
[place] MAIN [facility] SERVICES [address] [place], [place], [address] Phone: [phone]   Fax:  [phone]  Occupational Therapy Treatment/Progress Note  Patient Details  Name: Melissa Day MRN: [id number] Date of Birth: 10-Feb-1970 Referring Provider: Lottie Rater  Encounter Date: 12/31/2015      OT End of Session - 12/31/15 2036    Visit Number 10   Number of Visits 16   Date for OT Re-Evaluation 01/13/16   Authorization Type medicare G code 10   OT Start Time 0944   OT Stop Time 1030   OT Time Calculation (min) 46 min   Activity Tolerance Patient tolerated treatment well   Behavior During Therapy [facility] for tasks assessed/performed      Past Medical History:  Diagnosis Date  . Anemia   . H/O: CVA (cerebrovascular accident) 2012   a. cerebral edema, craniotomy, residual right upper & lower limb weakness  . Heart murmur   . High cholesterol   . History of echocardiogram    a. 12/01/2013: EF 50-55%, nl global LV systolic function, mild MR, mildly increased LV posterior wall thickness  . History of stress test    a. 12/01/2013: no significant ischemia, no significant WMA, no EKG changes concerning for ischemia, EF 67%, low risk scan  . Hx of seizure disorder    a. one episode 10 months after CVA, since then on Keppra   . Hypothyroidism   . Kidney stone   . Seizures ([place])   . Stroke ([place])   . Thyroid disease     Past Surgical History:  Procedure Laterality Date  . BRAIN SURGERY    . COLONOSCOPY WITH PROPOFOL N/A 11/18/2014   Procedure: COLONOSCOPY WITH PROPOFOL;  Surgeon: Josefine Class, MD;  Location: [facility] ENDOSCOPY;  Service: Endoscopy;  Laterality: N/A;  . CYSTOSCOPY/RETROGRADE/URETEROSCOPY/STONE EXTRACTION WITH BASKET Right 10/29/2014   Procedure: CYSTOSCOPY/URETEROSCOPY/STONE EXTRACTION WITH BASKET;  Surgeon: Franchot Gallo, MD;  Location: [facility] ORS;  Service: Urology;  Laterality: Right;  .  ESOPHAGOGASTRODUODENOSCOPY (EGD) WITH PROPOFOL N/A 11/18/2014   Procedure: ESOPHAGOGASTRODUODENOSCOPY (EGD) WITH PROPOFOL;  Surgeon: Josefine Class, MD;  Location: [facility] ENDOSCOPY;  Service: Endoscopy;  Laterality: N/A;  . TONSILLECTOMY      There were no vitals filed for this visit.      Subjective Assessment - 12/31/15 2029    Subjective  Patient reports she thinks she wants to hold therapy for December until she can get another Botox injection.  Patient reports she understands exercises to perform at home for home program.     Patient is accompained by: Family member   Pertinent History Patient had a CVA about 5 years ago, recent increase in right UE for spasticity and went for Botox injections to help.  Has not been wearing splint at night for her hand which has affected her finger extension.   Patient Stated Goals Patient states she wants her hand to relax, be more useful like cutting meat/food.     Currently in Pain? No/denies   Pain Score 0-No pain                      OT Treatments/Exercises (OP) - 12/31/15 2033      ADLs   ADL Comments Patient seen for preparation of fruit and vegetable smoothie, use of right hand for preparing food to place into blender, washing fruits and veggies, unable to use right hand to pour water or ice into blender.  Clean up  performed after completion of task with cues and use of BUE with occasional guiding, weight bearing as needed for task.      Neurological Re-education Exercises   Other Exercises 1 HEP for ROM, weight bearing, finger extension and use of right hand to pick up objects.                OT Education - 12/31/15 2036    Education provided Yes   Education Details HEP   Person(s) Educated Patient   Methods Explanation;Demonstration;Verbal cues   Comprehension Verbal cues required;Returned demonstration;Verbalized understanding             OT Long Term Goals - 12/31/15 2037      OT LONG TERM GOAL #1    Title Patient will demonstrate ability to cut food with modified independence.     Baseline unable at eval   Time 8   Period Weeks   Status On-going     OT LONG TERM GOAL #2   Title Patient will improve active ROM of right hand to hold comb to manage the right side of her hair.    Baseline unable    Time 8   Period Weeks   Status On-going     OT LONG TERM GOAL #3   Title Patient will clip fingernails with modified independence using adaptive equipment as needed.    Baseline unable at eval    Time 8   Period Weeks   Status On-going     OT LONG TERM GOAL #4   Title Patient will complete shoe tying with modified independence.    Baseline unable at eval   Time 8   Period Weeks   Status On-going     OT LONG TERM GOAL #5   Title Patient will complete sweeping of the floor with bilateral use of UEs to help manage and control the broom with modified independence.    Baseline moderate difficulty to unable    Time 8   Period Weeks   Status On-going     OT LONG TERM GOAL #6   Title Patient will demonstrate techniques to manage a move larger, heavy kitchen items with modified techniques and independence.    Baseline unable to manage filled pot of water   Time 8   Period Weeks   Status On-going               Plan - 12/31/15 2037    Clinical Impression Statement Patient with increased spasticity in right hand over the last couple weeks, able to demonstrate understanding of exercises and weight bearing for home program.  Patient requesting to be placed on hold for the month of December until she goes back for another round of Botox.  Patient is wearing splint at night for finger extension and is attempting to use right hand as much as possible during the day.  Will place patient on hold until I hear back from her.    Rehab Potential Good   OT Frequency 2x / week   OT Duration 8 weeks   OT Treatment/Interventions Self-care/ADL training;Therapeutic exercise;Moist  Heat;Neuromuscular education;Splinting;Therapeutic exercises;Patient/family education;DME and/or AE instruction;Manual Therapy;Passive range of motion;Therapeutic activities   Consulted and Agree with Plan of Care Patient      Patient will benefit from skilled therapeutic intervention in order to improve the following deficits and impairments:  Impaired flexibility, Pain, Decreased coordination, Impaired sensation, Decreased range of motion, Decreased strength, Impaired tone, Impaired UE functional use  Visit Diagnosis: H/O:  CVA (cerebrovascular accident)  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 Ureteral stone 10/29/2014  . Atypical chest pain 08/21/2014  . Malaise and fatigue 08/21/2014  . Morbid obesity ([place]) 08/21/2014  . H/O: CVA (cerebrovascular accident)   . Hx of seizure disorder   . History of echocardiogram   . History of stress test   . Heart murmur   . Thyroid disease   . Clinical depression 12/12/2013  . Vitamin D deficiency 12/12/2013  . 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 [place], [place], [address] Phone: [phone]   Fax:  [phone]  Name: REYANNE OLDT MRN: [id number] Date of Birth: 02-Feb-1971

## 2016-02-09 DIAGNOSIS — C801 Malignant (primary) neoplasm, unspecified: Secondary | ICD-10-CM

## 2016-02-09 HISTORY — DX: Malignant (primary) neoplasm, unspecified: C80.1

## 2016-08-09 ENCOUNTER — Ambulatory Visit
Admission: EM | Admit: 2016-08-09 | Discharge: 2016-08-09 | Disposition: A | Discharge status: Home / Self Care | Payer: Medicare Other | Attending: Family Medicine | Admitting: Family Medicine

## 2016-08-09 ENCOUNTER — Ambulatory Visit (INDEPENDENT_AMBULATORY_CARE_PROVIDER_SITE_OTHER): Payer: Medicare Other

## 2016-08-09 DIAGNOSIS — S93402A Sprain of unspecified ligament of left ankle, initial encounter: Secondary | ICD-10-CM

## 2016-08-09 DIAGNOSIS — S8012XA Contusion of left lower leg, initial encounter: Secondary | ICD-10-CM

## 2016-08-09 DIAGNOSIS — S80812A Abrasion, left lower leg, initial encounter: Secondary | ICD-10-CM

## 2016-08-09 NOTE — Discharge Instructions (Signed)
Rest. Ice. Gradually apply weight as tolerated.   Follow up with your primary care physician this week as needed. Return to Urgent care for new or worsening concerns.

## 2016-08-09 NOTE — ED Triage Notes (Signed)
46 year old Caucasian female is here today with her mother with complaints of left ankle pain due to falling coming outside of her home today. She states she fell again, off the curb getting into the car.

## 2016-08-09 NOTE — ED Provider Notes (Signed)
[facility] ____________________________________________  Time seen: Approximately 4:05 PM  I have reviewed the triage vital signs and the nursing notes.   HISTORY  Chief Complaint Ankle Pain   HPI Melissa Day is a 46 y.o. female   presenting with her mother at bedside for evaluation of left knee and left leg and left ankle pain after a fall that happened just prior to arrival. Patient reports that she was going outside from her front door tripped over the above and fell. Reports then going to get into her car she tripped and fell again landing on left leg.. States that she fell only because she tripped. Denies any weakness, dizziness or other reason for falling. 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 Again reports only felt due to tripping. Clinical complaints of mostly left ankle pain. Reports this happened just prior to arrival. States mild left ankle pain at this time sitting still, moderate with active weightbearing. Reports still has range of motion but with pain. No medicines taken over-the-counter prior to arrival. Denies any other aggravating or alleviating factors. Denies head injury or loss of consciousness. Denies other pain or injury. Reports otherwise feels well.  Denies chest pain, shortness of breath, abdominal pain, dysuria, extremity pain, extremity swelling or rash. Denies recent sickness. Denies recent antibiotic use.    Past Medical History:  Diagnosis Date  . Anemia   . H/O: CVA (cerebrovascular accident) 2012   a. cerebral edema, craniotomy, residual right upper & lower limb weakness  . Heart murmur   . High cholesterol   . History of echocardiogram    a. 12/01/2013: EF 50-55%, nl global LV systolic function, mild MR, mildly increased LV posterior wall thickness  . History of stress test    a. 12/01/2013: no significant ischemia, no significant WMA, no EKG changes concerning for ischemia, EF 67%, low risk scan  . Hx of  seizure disorder    a. one episode 10 months after CVA, since then on Keppra   . Hypothyroidism   . Kidney stone   . Seizures ([place])   . Stroke ([place])   . Thyroid disease     Patient Active Problem List   Diagnosis Date Noted  . Ureteral stone 10/29/2014  . Atypical chest pain 08/21/2014  . Malaise and fatigue 08/21/2014  . Morbid obesity ([place]) 08/21/2014  . H/O: CVA (cerebrovascular accident)   . Hx of seizure disorder   . History of echocardiogram   . History of stress test   . Heart murmur   . Thyroid disease   . Clinical depression 12/12/2013  . Vitamin D deficiency 12/12/2013  . Hemiplegia as late effect of cerebrovascular disease ([place]) 11/18/2010    Past Surgical History:  Procedure Laterality Date  . BRAIN SURGERY    . COLONOSCOPY WITH PROPOFOL N/A 11/18/2014   Procedure: COLONOSCOPY WITH PROPOFOL;  Surgeon: Josefine Class, MD;  Location: [facility] ENDOSCOPY;  Service: Endoscopy;  Laterality: N/A;  . CYSTOSCOPY/RETROGRADE/URETEROSCOPY/STONE EXTRACTION WITH BASKET Right 10/29/2014   Procedure: CYSTOSCOPY/URETEROSCOPY/STONE EXTRACTION WITH BASKET;  Surgeon: Franchot Gallo, MD;  Location: [facility] ORS;  Service: Urology;  Laterality: Right;  . ESOPHAGOGASTRODUODENOSCOPY (EGD) WITH PROPOFOL N/A 11/18/2014   Procedure: ESOPHAGOGASTRODUODENOSCOPY (EGD) WITH PROPOFOL;  Surgeon: Josefine Class, MD;  Location: [facility] ENDOSCOPY;  Service: Endoscopy;  Laterality: N/A;  . TONSILLECTOMY       No current facility-administered medications for this encounter.   Current Outpatient Prescriptions:  .  aspirin EC 81 MG tablet, Take 81 mg  by mouth daily., Disp: , Rfl:  .  atorvastatin (LIPITOR) 40 MG tablet, Take 1 tablet (40 mg total) by mouth daily., Disp: 30 tablet, Rfl: 3 .  Docosahexaenoic Acid (DHA OMEGA 3) 100 MG CAPS, Take 100 mg by mouth daily., Disp: , Rfl:  .  ferrous sulfate 325 (65 FE) MG tablet, Take 325 mg by mouth daily. , Disp: , Rfl:  .  FLUoxetine (PROZAC) 40 MG  capsule, Take 40 mg by mouth daily., Disp: , Rfl:  .  levETIRAcetam (KEPPRA) 750 MG tablet, Take 1 tablet (750 mg total) by mouth 2 (two) times daily., Disp: 28 tablet, Rfl: 0 .  levothyroxine (SYNTHROID, LEVOTHROID) 112 MCG tablet, Take 112 mcg by mouth daily before breakfast., Disp: , Rfl:   Allergies Patient has no known allergies.  Family History  Problem Relation Age of Onset  . Nephrolithiasis Mother   . Breast cancer Mother 18  . Heart disease Father   . CVA Father   . Nephrolithiasis Maternal Grandfather   . Breast cancer Sister 44    Social History Social History  Substance Use Topics  . Smoking status: Former Research scientist (life sciences)  . Smokeless tobacco: Never Used  . Alcohol use No    Review of Systems Constitutional: No fever/chills Cardiovascular: Denies chest pain. Respiratory: Denies shortness of breath. Gastrointestinal: No abdominal pain.   Musculoskeletal: Negative for back pain. As above.  Skin: Negative for rash.   ____________________________________________   PHYSICAL EXAM:  VITAL SIGNS: ED Triage Vitals  Enc Vitals Group     BP 08/09/16 1436 138/90     Pulse Rate 08/09/16 1436 (!) 103     Resp 08/09/16 1436 18     Temp 08/09/16 1436 98.1 F (36.7 C)     Temp Source 08/09/16 1436 Oral     SpO2 08/09/16 1436 97 %     Weight 08/09/16 1437 200 lb (90.7 kg)     Height 08/09/16 1437 5\' 3"  (1.6 m)     Head Circumference --      Peak Flow --      Pain Score 08/09/16 1437 9     Pain Loc --      Pain Edu? --      Excl. in [place]? --     Constitutional: Alert and oriented. Well appearing and in no acute distress. Cardiovascular: Normal rate, regular rhythm. Grossly normal heart sounds.  Good peripheral circulation. Respiratory: Normal respiratory effort without tachypnea nor retractions. Breath sounds are clear and equal bilaterally. No wheezes, rales, rhonchi. Musculoskeletal:  No midline cervical, thoracic or lumbar tenderness to palpation. Bilateral pedal  pulses equal and easily palpated.      Right lower leg:  No tenderness or edema.      Left lower leg:  No tenderness or edema.  Except: Left anterior knee at proximal tibia mild to moderate tenderness to palpation, superficial abrasion, full range of motion present, mild pain with medial or lateral stress, no pain with anterior posterior drawer test, full range of motion present. Minimal tenderness to mid anterior tibial area, no ecchymosis, no erythema. Left lateral ankle moderate tenderness to palpation with mild edema, pain with ankle rotation and slightly limited rotation, mild pain with left ankle plantarflexion and dorsiflexion and mild to moderate left great toe tenderness, left lower extremity otherwise nontender. No hip tenderness. No posterior calf tenderness. Left foot normal distal sensation and capillary refill.  Neurologic:  Normal speech and language. Speech is normal. No gait instability.  Skin:  Skin is warm, dry. Psychiatric: Mood and affect are normal. Speech and behavior are normal. Patient exhibits appropriate insight and judgment.   ___________________________________________   LABS (all labs ordered are listed, but only abnormal results are displayed)  Labs Reviewed - No data to display  RADIOLOGY  Dg Tibia/fibula Left  Result Date: 08/09/2016 CLINICAL DATA:  Fall today with left leg pain, initial encounter EXAM: LEFT TIBIA AND FIBULA - 2 VIEW COMPARISON:  None. FINDINGS: There is no evidence of fracture or other focal bone lesions. Soft tissues are unremarkable. IMPRESSION: No acute abnormality noted. Electronically Signed   By: Inez Catalina M.D.   On: 08/09/2016 15:42   Dg Ankle Complete Left  Result Date: 08/09/2016 CLINICAL DATA:  Fall today with left ankle pain, initial encounter EXAM: LEFT ANKLE COMPLETE - 3+ VIEW COMPARISON:  None. FINDINGS: There is no evidence of fracture, dislocation, or joint effusion. There is no evidence of arthropathy or other focal bone  abnormality. Soft tissues are unremarkable. IMPRESSION: No acute abnormality noted. Electronically Signed   By: Inez Catalina M.D.   On: 08/09/2016 15:43   Dg Foot Complete Left  Result Date: 08/09/2016 CLINICAL DATA:  Recent fall with foot pain, initial encounter EXAM: LEFT FOOT - COMPLETE 3+ VIEW COMPARISON:  None. FINDINGS: There is no evidence of fracture or dislocation. No soft tissue abnormality is noted. Small calcaneal spur is seen. IMPRESSION: No acute abnormality noted. Electronically Signed   By: Inez Catalina M.D.   On: 08/09/2016 15:44   ____________________________________________   PROCEDURES Procedures    INITIAL IMPRESSION / ASSESSMENT AND PLAN / ED COURSE  Pertinent labs & imaging results that were available during my care of the patient were reviewed by me and considered in my medical decision making (see chart for details).  Well appearing patient. No acute distress. Patient with chronic right-sided hemiplegia and dysphasia post CVA. Reporting fell twice just prior to arrival after tripping at home. Denies any other complaints. Reports fell only because of tripping. Left knee and left ankle and left leg pain. X-rays per radiologist negative and reviewed by myself. Suspect contusion and left ankle sprain injury. Encouraged rest, supportive care. Left ankle splint applied for support. Ice and elevate. Follow-up as needed for continued pain. Denies need for prescription pain medication.  Discussed follow up with Primary care physician this week as needed. Discussed follow up and return parameters including no resolution or any worsening concerns. Patient verbalized understanding and agreed to plan.   ____________________________________________   FINAL CLINICAL IMPRESSION(S) / ED DIAGNOSES  Final diagnoses:  Sprain of left ankle, unspecified ligament, initial encounter  Contusion of left lower extremity, initial encounter     New Prescriptions   No medications on file      Note: This dictation was prepared with Dragon dictation along with smaller phrase technology. Any transcriptional errors that result from this process are unintentional.         Marylene Land, NP 08/09/16 1626

## 2016-08-11 ENCOUNTER — Telehealth: Payer: Self-pay

## 2016-08-11 NOTE — Telephone Encounter (Signed)
Pt called for xray results. She was informed that all xrays were negative. She was encouraged to continue to use the ice for 15 minutes every hour, elevate, rest it and use OTC meds for pain relief. Pt verbalized understanding. She was also encouraged to follow up with her PCP, [facility], or [facility] if the pain persist.

## 2016-10-08 ENCOUNTER — Other Ambulatory Visit: Payer: Self-pay | Admitting: Internal Medicine

## 2016-10-08 DIAGNOSIS — Z1231 Encounter for screening mammogram for malignant neoplasm of breast: Secondary | ICD-10-CM

## 2016-11-19 ENCOUNTER — Ambulatory Visit
Admission: RE | Admit: 2016-11-19 | Discharge: 2016-11-19 | Disposition: A | Discharge status: Home / Self Care | Payer: Medicare Other | Source: Ambulatory Visit | Attending: Internal Medicine | Admitting: Internal Medicine

## 2016-11-19 DIAGNOSIS — R921 Mammographic calcification found on diagnostic imaging of breast: Secondary | ICD-10-CM | POA: Diagnosis not present

## 2016-11-19 DIAGNOSIS — Z1231 Encounter for screening mammogram for malignant neoplasm of breast: Secondary | ICD-10-CM | POA: Diagnosis not present

## 2016-11-19 DIAGNOSIS — R928 Other abnormal and inconclusive findings on diagnostic imaging of breast: Secondary | ICD-10-CM | POA: Diagnosis not present

## 2016-11-22 ENCOUNTER — Other Ambulatory Visit: Payer: Self-pay | Admitting: Internal Medicine

## 2016-11-22 DIAGNOSIS — R921 Mammographic calcification found on diagnostic imaging of breast: Secondary | ICD-10-CM

## 2016-11-22 DIAGNOSIS — R928 Other abnormal and inconclusive findings on diagnostic imaging of breast: Secondary | ICD-10-CM

## 2016-11-25 NOTE — Progress Notes (Signed)
Cardiology Office Note  Date:  11/26/2016   ID:  Melissa Day, DOB [birth year], MRN [id number]  PCP:  Tracie Harrier, MD   Chief Complaint  Patient presents with  . other    12 month follow up. Meds reviewed by the pt. verbally. "doing well."     HPI:  46 y.o. female with H/O:  CVA (cerebrovascular accident)   2012,   cerebral edema, craniotomy, residual right upper & lower limb weakness, seizure disorder, one episode 10 months after CVA, since then on Keppra  Hypothyroidism,  presenting for malaise,  Episode of chest discomfort in October 2015 with evaluation in the hospital at that time with normal echocardiogram and stress test follow-up of her chest discomfort  Weight is up 20 pounds from previous numbers Eating the wrong foods, sugar trending up now 160 on recent lab work Started on metformin by primary care Drinking lots of sweet tea, eats out at the [place] No dietary restrictions so far No regular exercise program Denies any chest pain concerning for angina Denies any seizure type symptoms  EKG personally reviewed by myself on today's visit shows normal sinus rhythm with rate 71 bpm, no significant ST or T-wave changes  Other past medical history reviewed Previously in the emergency room for general malaise. She told family she did not feel right for a couple of days.  had vertigo, GI upset, unclear if she had chest discomfort.  EKG and cardiac enzymes normal. D-dimer normal. scan of the head normal   she was discharged home ER notes suggest she had no chest pain symptoms for at least 10 days from the day of evaluation   12/01/2013: EF 50-55%, nl global LV systolic function, mild MR, mildly increased LV posterior wall thickness .History of stress test   12/01/2013: no significant ischemia, no significant WMA, no EKG changes concerning for ischemia, EF 67%, low risk scan  stroke in 2012 --> cerebral edema, craniotomy, right sided weakness that was  treated at [facility]. She has remained on aspirin 81 mg since.  History of smoking up until her stroke  hospitalization at [facility] 11/2013 with left sided chest pain that radiated to her left arm. She had recently used her arm to pull herself up some stairs with a pulley-like system. Troponin were negative. She underwent Lexiscan Myoview that was Low risk, no significant ischemia, no significant wall motion abnormalities, EF 67%. Echo showed EF of 50-55%, mild MR and was otherwise normal. Chest pain was felt to be atypical in etiology.   Cardiac event monitoring  reveals NSR.   PMH:   has a past medical history of Anemia; H/O: CVA (cerebrovascular accident) (2012); Heart murmur; High cholesterol; History of echocardiogram; History of stress test; seizure disorder; Hypothyroidism; Kidney stone; Seizures ([place]); Stroke [facility]); and Thyroid disease.  PSH:    Past Surgical History:  Procedure Laterality Date  . BRAIN SURGERY    . COLONOSCOPY WITH PROPOFOL N/A 11/18/2014   Procedure: COLONOSCOPY WITH PROPOFOL;  Surgeon: Josefine Class, MD;  Location: [facility] ENDOSCOPY;  Service: Endoscopy;  Laterality: N/A;  . CYSTOSCOPY/RETROGRADE/URETEROSCOPY/STONE EXTRACTION WITH BASKET Right 10/29/2014   Procedure: CYSTOSCOPY/URETEROSCOPY/STONE EXTRACTION WITH BASKET;  Surgeon: Franchot Gallo, MD;  Location: [facility] ORS;  Service: Urology;  Laterality: Right;  . ESOPHAGOGASTRODUODENOSCOPY (EGD) WITH PROPOFOL N/A 11/18/2014   Procedure: ESOPHAGOGASTRODUODENOSCOPY (EGD) WITH PROPOFOL;  Surgeon: Josefine Class, MD;  Location: [facility] ENDOSCOPY;  Service: Endoscopy;  Laterality: N/A;  . TONSILLECTOMY      Current Outpatient Prescriptions  Medication Sig Dispense Refill  . aspirin EC 81 MG tablet Take 81 mg by mouth daily.    Marland Kitchen atorvastatin (LIPITOR) 40 MG tablet Take 1 tablet (40 mg total) by mouth daily. 30 tablet 3  . Docosahexaenoic Acid (DHA OMEGA 3) 100 MG CAPS Take 100 mg by mouth daily.    . ferrous sulfate 325  (65 FE) MG tablet Take 325 mg by mouth daily.     Marland Kitchen FLUoxetine (PROZAC) 40 MG capsule Take 40 mg by mouth daily.    Marland Kitchen levETIRAcetam (KEPPRA) 750 MG tablet Take 1 tablet (750 mg total) by mouth 2 (two) times daily. 28 tablet 0  . levothyroxine (SYNTHROID, LEVOTHROID) 112 MCG tablet Take 112 mcg by mouth daily before breakfast.    . metFORMIN (GLUCOPHAGE) 500 MG tablet Take 500 mg by mouth daily with breakfast.     No current facility-administered medications for this visit.      Allergies:   Patient has no known allergies.   Social History:  The patient  reports that she has quit smoking. She has never used smokeless tobacco. She reports that she does not drink alcohol or use drugs.   Family History:   family history includes Breast cancer (age of onset: 51) in her sister; Breast cancer (age of onset: 69) in her mother; CVA in her father; Heart disease in her father; Nephrolithiasis in her maternal grandfather and mother.    Review of Systems: Review of Systems  Constitutional: Negative.        Weight gain  Respiratory: Negative.   Cardiovascular: Negative.   Gastrointestinal: Negative.   Musculoskeletal: Negative.   Neurological: Negative.   Psychiatric/Behavioral: Negative.   All other systems reviewed and are negative. No change from previous visit   PHYSICAL EXAM: VS:  BP 120/78 (BP Location: Left Arm, Patient Position: Sitting, Cuff Size: Normal)   Pulse 71   Ht 5' (1.524 m)   Wt 221 lb 8 oz (100.5 kg)   BMI 43.26 kg/m  , BMI Body mass index is 43.26 kg/m.  GEN: Well nourished, well developed, in no acute distress, obese  HEENT: normal  Neck: no JVD, carotid bruits, or masses Cardiac: RRR; no murmurs, rubs, or gallops,no edema  Respiratory:  clear to auscultation bilaterally, normal work of breathing GI: soft, nontender, nondistended, + BS MS: no deformity or atrophy  Skin: warm and dry, no rash Neuro:  Strength and sensation are intact Psych: euthymic mood, full  affect    Recent Labs: No results found for requested labs within last 8760 hours.    Lipid Panel Lab Results  Component Value Date   CHOL 152 12/01/2013   HDL 31 (L) 12/01/2013   LDLCALC 94 12/01/2013   TRIG 133 12/01/2013      Wt Readings from Last 3 Encounters:  11/26/16 221 lb 8 oz (100.5 kg)  08/09/16 200 lb (90.7 kg)  11/27/15 226 lb (102.5 kg)       ASSESSMENT AND PLAN:  Atypical chest pain - Plan: EKG 12-Lead No further workup at this time, currently not having symptoms Denies any further symptoms, stable  Morbid obesity ([place]) Long discussion concerning dietary changes Weight is up 20 pounds, recommended water instead of sweet tea Low carbohydrate diet, guideline provided  Seizures ([facility]) Maintained on Keppra Denies any recent seizures  Morbid obesity Long discussion concerning her diet, need to lose weight  Type 2 diabetes Recently started on metformin Long discussion concerning her diet and need for weight loss as  above   Total encounter time more than 25 minutes  Greater than 50% was spent in counseling and coordination of care with the patient  Disposition:   F/U  12 months as needed   Orders Placed This Encounter  Procedures  . EKG 12-Lead     Signed, Esmond Plants, M.D., Ph.D. 11/26/2016  [place], [place]

## 2016-11-26 ENCOUNTER — Encounter: Payer: Self-pay | Admitting: Cardiovascular Disease

## 2016-11-26 ENCOUNTER — Ambulatory Visit (INDEPENDENT_AMBULATORY_CARE_PROVIDER_SITE_OTHER): Payer: Medicare Other | Admitting: Cardiovascular Disease

## 2016-11-26 DIAGNOSIS — Z8669 Personal history of other diseases of the nervous system and sense organs: Secondary | ICD-10-CM

## 2016-11-26 DIAGNOSIS — Z8673 Personal history of transient ischemic attack (TIA), and cerebral infarction without residual deficits: Secondary | ICD-10-CM | POA: Diagnosis not present

## 2016-11-26 DIAGNOSIS — E118 Type 2 diabetes mellitus with unspecified complications: Secondary | ICD-10-CM

## 2016-11-26 DIAGNOSIS — R079 Chest pain, unspecified: Secondary | ICD-10-CM

## 2016-11-26 NOTE — Patient Instructions (Addendum)

## 2016-11-29 ENCOUNTER — Encounter: Payer: Self-pay | Admitting: Emergency Medicine

## 2016-11-29 ENCOUNTER — Ambulatory Visit
Admission: EM | Admit: 2016-11-29 | Discharge: 2016-11-29 | Disposition: A | Discharge status: Home / Self Care | Payer: Medicare Other | Attending: Family Medicine | Admitting: Family Medicine

## 2016-11-29 DIAGNOSIS — Z23 Encounter for immunization: Secondary | ICD-10-CM | POA: Diagnosis not present

## 2016-11-29 DIAGNOSIS — W260XXA Contact with knife, initial encounter: Secondary | ICD-10-CM

## 2016-11-29 DIAGNOSIS — S61212A Laceration without foreign body of right middle finger without damage to nail, initial encounter: Secondary | ICD-10-CM | POA: Diagnosis not present

## 2016-11-29 MED ORDER — TETANUS-DIPHTH-ACELL PERTUSSIS 5-2.5-18.5 LF-MCG/0.5 IM SUSP
0.5000 mL | Freq: Once | INTRAMUSCULAR | Status: AC
  Administered 2016-11-29: 0.5 mL via INTRAMUSCULAR

## 2016-11-29 NOTE — Discharge Instructions (Signed)
Follow up as needed if any problems

## 2016-11-29 NOTE — ED Triage Notes (Signed)
Patient states she was using a "chopper' and cut her right hand.

## 2016-11-29 NOTE — ED Provider Notes (Signed)
[facility]    CSN: [id number] Arrival date & time: 11/29/16  1547     History   Chief Complaint Chief Complaint  Patient presents with  . Extremity Laceration    HPI Melissa Day is a 46 y.o. female.   46 yo female with a h/o stroke and subsequent contractures of hand presents with a c/o laceration to back of right middle finger sustained while using a "chopper" to cut. Does not recall last tetanus.    The history is provided by the patient.    Past Medical History:  Diagnosis Date  . Anemia   . H/O: CVA (cerebrovascular accident) 2012   a. cerebral edema, craniotomy, residual right upper & lower limb weakness  . Heart murmur   . High cholesterol   . History of echocardiogram    a. 12/01/2013: EF 50-55%, nl global LV systolic function, mild MR, mildly increased LV posterior wall thickness  . History of stress test    a. 12/01/2013: no significant ischemia, no significant WMA, no EKG changes concerning for ischemia, EF 67%, low risk scan  . Hx of seizure disorder    a. one episode 10 months after CVA, since then on Keppra   . Hypothyroidism   . Kidney stone   . Seizures ([place])   . Stroke ([place])   . Thyroid disease     Patient Active Problem List   Diagnosis Date Noted  . Ureteral stone 10/29/2014  . Chest pain 08/21/2014  . Malaise and fatigue 08/21/2014  . Morbid obesity ([place]) 08/21/2014  . H/O: CVA (cerebrovascular accident)   . Hx of seizure disorder   . History of echocardiogram   . History of stress test   . Heart murmur   . Thyroid disease   . Clinical depression 12/12/2013  . Vitamin D deficiency 12/12/2013  . 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 BRAIN SURGERY    . COLONOSCOPY WITH PROPOFOL N/A 11/18/2014   Procedure: COLONOSCOPY WITH PROPOFOL;  Surgeon: Josefine Class, MD;  Location: [facility] ENDOSCOPY;  Service: Endoscopy;  Laterality:  N/A;  . CYSTOSCOPY/RETROGRADE/URETEROSCOPY/STONE EXTRACTION WITH BASKET Right 10/29/2014   Procedure: CYSTOSCOPY/URETEROSCOPY/STONE EXTRACTION WITH BASKET;  Surgeon: Franchot Gallo, MD;  Location: [facility] ORS;  Service: Urology;  Laterality: Right;  . ESOPHAGOGASTRODUODENOSCOPY (EGD) WITH PROPOFOL N/A 11/18/2014   Procedure: ESOPHAGOGASTRODUODENOSCOPY (EGD) WITH PROPOFOL;  Surgeon: Josefine Class, MD;  Location: [facility] ENDOSCOPY;  Service: Endoscopy;  Laterality: N/A;  . TONSILLECTOMY      OB History    No data available       Home Medications    Prior to Admission medications   Medication Sig Start Date End Date Taking? Authorizing Provider  aspirin EC 81 MG tablet Take 81 mg by mouth daily.    [provider]  atorvastatin (LIPITOR) 40 MG tablet Take 1 tablet (40 mg total) by mouth daily. 06/21/14   Dunn, Areta Haber, PA-C  Docosahexaenoic Acid (DHA OMEGA 3) 100 MG CAPS Take 100 mg by mouth daily.    [provider]  ferrous sulfate 325 (65 FE) MG tablet Take 325 mg by mouth daily.     [provider]  FLUoxetine (PROZAC) 40 MG capsule Take 40 mg by mouth daily.    [provider]  levETIRAcetam (KEPPRA) 750 MG tablet Take 1 tablet (750 mg total) by mouth 2 (two) times daily. 08/21/14   Minna Merritts,  MD  levothyroxine (SYNTHROID, LEVOTHROID) 112 MCG tablet Take 112 mcg by mouth daily before breakfast.    [provider]  metFORMIN (GLUCOPHAGE) 500 MG tablet Take 500 mg by mouth daily with breakfast.    [provider]    Family History Family History  Problem Relation Age of Onset  . Nephrolithiasis Mother   . Breast cancer Mother 73  . Heart disease Father   . CVA Father   . Nephrolithiasis Maternal Grandfather   . Breast cancer Sister 71    Social History Social History  Substance Use Topics  . Smoking status: Former Research scientist (life sciences)  . Smokeless tobacco: Never Used  . Alcohol use Not on file     Allergies   Patient has  no known allergies.   Review of Systems Review of Systems   Physical Exam Triage Vital Signs ED Triage Vitals  Enc Vitals Group     BP 11/29/16 1555 126/90     Pulse Rate 11/29/16 1555 86     Resp 11/29/16 1555 18     Temp 11/29/16 1555 98.2 F (36.8 C)     Temp Source 11/29/16 1555 Oral     SpO2 11/29/16 1555 98 %     Weight 11/29/16 1558 215 lb (97.5 kg)     Height 11/29/16 1558 5\' 3"  (1.6 m)     Head Circumference --      Peak Flow --      Pain Score 11/29/16 1558 0     Pain Loc --      Pain Edu? --      Excl. in [place]? --    No data found.   Updated Vital Signs BP 126/90   Pulse 86   Temp 98.2 F (36.8 C) (Oral)   Resp 18   Ht 5\' 3"  (1.6 m)   Wt 215 lb (97.5 kg)   LMP 10/30/2016   SpO2 98%   BMI 38.09 kg/m   Visual Acuity Right Eye Distance:   Left Eye Distance:   Bilateral Distance:    Right Eye Near:   Left Eye Near:    Bilateral Near:     Physical Exam  Constitutional: She appears well-developed and well-nourished. No distress.  Musculoskeletal:       Right hand: She exhibits decreased range of motion (chronic contratures of fingers post CVA) and laceration (1cm and very superficial to skin over dorsal aspect of middle phalanges of middle finger). She exhibits no bony tenderness, normal two-point discrimination, normal capillary refill and no swelling. Normal sensation noted. Normal strength noted.   fingers/hand neurovascularly intact  Skin: She is not diaphoretic.  Nursing note and vitals reviewed.    UC Treatments / Results  Labs (all labs ordered are listed, but only abnormal results are displayed) Labs Reviewed - No data to display  EKG  EKG Interpretation None       Radiology No results found.  Procedures .Marland KitchenLaceration Repair Date/Time: 11/29/2016 8:26 PM Performed by: Norval Gable Authorized by: Norval Gable   Consent:    Consent obtained:  Verbal   Consent given by:  Patient   Risks discussed:  Infection, need for  additional repair, poor wound healing, poor cosmetic result and retained foreign body   Alternatives discussed:  No treatment Anesthesia (see MAR for exact dosages):    Anesthesia method:  None Laceration details:    Location:  Finger   Finger location:  R long finger   Length (cm):  1 Repair type:  Repair type:  Simple Exploration:    Hemostasis achieved with:  Direct pressure   Wound exploration: wound explored through full range of motion     Wound extent: no areolar tissue violation noted, no fascia violation noted, no foreign bodies/material noted, no muscle damage noted, no nerve damage noted, no tendon damage noted, no underlying fracture noted and no vascular damage noted     Contaminated: no   Treatment:    Area cleansed with:  Hibiclens   Amount of cleaning:  Standard   Irrigation solution:  Sterile saline   Irrigation method:  Syringe   Foreign body removal: no foreign material visualized.   Skin repair:    Repair method:  Tissue adhesive Approximation:    Approximation:  Close Post-procedure details:    Dressing:  Open (no dressing)   (including critical care time)  Medications Ordered in UC Medications  Tdap (BOOSTRIX) injection 0.5 mL (0.5 mLs Intramuscular Given 11/29/16 1608)     Initial Impression / Assessment and Plan / UC Course  I have reviewed the triage vital signs and the nursing notes.  Pertinent labs & imaging results that were available during my care of the patient were reviewed by me and considered in my medical decision making (see chart for details).       Final Clinical Impressions(s) / UC Diagnoses   Final diagnoses:  Laceration of right middle finger without foreign body without damage to nail, initial encounter    New Prescriptions Discharge Medication List as of 11/29/2016  4:40 PM     1. diagnosis reviewed with patient 2.given tetanus vaccine 3. Repair with adhesive glue as per procedure above 4. Recommend supportive  treatment with routine wound care 5. Follow-up prn if symptoms worsen or don't improve  Controlled Substance Prescriptions [place] Controlled Substance Registry consulted? Not Applicable   Norval Gable, MD 11/29/16 2028

## 2016-12-01 ENCOUNTER — Other Ambulatory Visit: Payer: Self-pay | Admitting: Internal Medicine

## 2016-12-01 ENCOUNTER — Ambulatory Visit
Admission: RE | Admit: 2016-12-01 | Discharge: 2016-12-01 | Disposition: A | Discharge status: Home / Self Care | Payer: Medicare Other | Source: Ambulatory Visit | Attending: Internal Medicine | Admitting: Internal Medicine

## 2016-12-01 DIAGNOSIS — R928 Other abnormal and inconclusive findings on diagnostic imaging of breast: Secondary | ICD-10-CM

## 2016-12-01 DIAGNOSIS — R921 Mammographic calcification found on diagnostic imaging of breast: Secondary | ICD-10-CM | POA: Insufficient documentation

## 2016-12-01 DIAGNOSIS — Z803 Family history of malignant neoplasm of breast: Secondary | ICD-10-CM | POA: Diagnosis not present

## 2016-12-02 ENCOUNTER — Other Ambulatory Visit: Payer: Self-pay | Admitting: Internal Medicine

## 2016-12-02 DIAGNOSIS — R921 Mammographic calcification found on diagnostic imaging of breast: Secondary | ICD-10-CM

## 2016-12-02 DIAGNOSIS — R928 Other abnormal and inconclusive findings on diagnostic imaging of breast: Secondary | ICD-10-CM

## 2016-12-13 ENCOUNTER — Ambulatory Visit
Admission: RE | Admit: 2016-12-13 | Discharge: 2016-12-13 | Disposition: A | Discharge status: Home / Self Care | Payer: Medicare Other | Source: Ambulatory Visit | Attending: Internal Medicine | Admitting: Internal Medicine

## 2016-12-13 DIAGNOSIS — R928 Other abnormal and inconclusive findings on diagnostic imaging of breast: Secondary | ICD-10-CM

## 2016-12-13 DIAGNOSIS — R921 Mammographic calcification found on diagnostic imaging of breast: Secondary | ICD-10-CM | POA: Diagnosis present

## 2016-12-13 HISTORY — PX: BREAST BIOPSY: SHX20

## 2016-12-14 LAB — SURGICAL PATHOLOGY

## 2016-12-17 ENCOUNTER — Other Ambulatory Visit: Payer: Self-pay | Admitting: Surgery

## 2016-12-17 DIAGNOSIS — N63 Unspecified lump in unspecified breast: Secondary | ICD-10-CM

## 2016-12-29 ENCOUNTER — Encounter
Admission: RE | Admit: 2016-12-29 | Discharge: 2016-12-29 | Disposition: A | Discharge status: Home / Self Care | Payer: Medicare Other | Source: Ambulatory Visit | Attending: Surgery | Admitting: Surgery

## 2016-12-29 ENCOUNTER — Other Ambulatory Visit: Payer: Self-pay

## 2016-12-29 DIAGNOSIS — N63 Unspecified lump in unspecified breast: Secondary | ICD-10-CM | POA: Insufficient documentation

## 2016-12-29 DIAGNOSIS — Z01818 Encounter for other preprocedural examination: Secondary | ICD-10-CM | POA: Insufficient documentation

## 2016-12-29 NOTE — Patient Instructions (Addendum)
Your procedure is scheduled on: 01/06/17 Thurs Go to breast center at 7:45 am Report to Same Day Surgery 2nd floor medical mall [facility] Entrance-take elevator on left to 2nd floor.  Check in with surgery information desk.) To find out your arrival time please call [phone] between 1PM - 3PM on 01/05/18  Wed  Remember: Instructions that are not followed completely may result in serious medical risk, up to and including death, or upon the discretion of your surgeon and anesthesiologist your surgery may need to be rescheduled.    _x___ 1. Do not eat food after midnight the night before your procedure. You may drink clear liquids up to 2 hours before you are scheduled to arrive at the hospital for your procedure.  Do not drink clear liquids within 2 hours of your scheduled arrival to the hospital.  Clear liquids include  --Water or Apple juice without pulp  --Clear carbohydrate beverage such as ClearFast or Gatorade  --Black Coffee or Clear Tea (No milk, no creamers, do not add anything to                  the coffee or Tea Type 1 and type 2 diabetics should only drink water.  No gum chewing or hard candies.     __x__ 2. No Alcohol for 24 hours before or after surgery.   __x__3. No Smoking for 24 prior to surgery.   ____  4. Bring all medications with you on the day of surgery if instructed.    __x__ 5. Notify your doctor if there is any change in your medical condition     (cold, fever, infections).     Do not wear jewelry, make-up, hairpins, clips or nail polish.  Do not wear lotions, powders, or perfumes. You may wear deodorant.  Do not shave 48 hours prior to surgery. Men may shave face and neck.  Do not bring valuables to the hospital.    [facility] is not responsible for any belongings or valuables.               Contacts, dentures or bridgework may not be worn into surgery.  Leave your suitcase in the car. After surgery it may be brought to your room.  For patients  admitted to the hospital, discharge time is determined by your                       treatment team.   Patients discharged the day of surgery will not be allowed to drive home.  You will need someone to drive you home and stay with you the night of your procedure.    Please read over the following fact sheets that you were given:   [facility] Preparing for Surgery and or MRSA Information   _x___ Take anti-hypertensive listed below, cardiac, seizure, asthma,     anti-reflux and psychiatric medicines. These include:  1. levETIRAcetam (KEPPRA) 750 MG tablet  2.levothyroxine (SYNTHROID, LEVOTHROID) 137 MCG tablet  3.  4.  5.  6.  ____Fleets enema or Magnesium Citrate as directed.   _x___ Use CHG Soap or sage wipes as directed on instruction sheet   ____ Use inhalers on the day of surgery and bring to hospital day of surgery  __x__ Stop Metformin and Janumet 2 days prior to surgery.    ____ Take 1/2 of usual insulin dose the night before surgery and none on the morning     surgery.  _x___ Follow recommendations from Cardiologist, Pulmonologist or PCP regarding          stopping Aspirin, Coumadin, Plavix ,Eliquis, Effient, or Pradaxa, and Pletal.  X____Stop Anti-inflammatories such as Advil, Aleve, Ibuprofen, Motrin, Naproxen, Naprosyn, Goodies powders or aspirin products. OK to take Tylenol and                          Celebrex.  To stop Aspirin tomorrow.   _x___ Stop supplements until after surgery.  But may continue Vitamin D, Vitamin B,       and multivitamin.   ____ Bring C-Pap to the hospital.

## 2017-01-06 ENCOUNTER — Ambulatory Visit: Payer: Medicare Other | Admitting: Certified Registered Nurse Anesthetist

## 2017-01-06 ENCOUNTER — Ambulatory Visit
Admission: RE | Admit: 2017-01-06 | Discharge: 2017-01-06 | Disposition: A | Discharge status: Home / Self Care | Payer: Medicare Other | Source: Ambulatory Visit | Attending: Surgery | Admitting: Surgery

## 2017-01-06 ENCOUNTER — Encounter
Admission: RE | Disposition: A | Discharge status: Home / Self Care | Payer: Self-pay | Source: Ambulatory Visit | Attending: Surgery

## 2017-01-06 DIAGNOSIS — N63 Unspecified lump in unspecified breast: Secondary | ICD-10-CM

## 2017-01-06 DIAGNOSIS — Z79899 Other long term (current) drug therapy: Secondary | ICD-10-CM | POA: Diagnosis not present

## 2017-01-06 DIAGNOSIS — I69320 Aphasia following cerebral infarction: Secondary | ICD-10-CM | POA: Insufficient documentation

## 2017-01-06 DIAGNOSIS — E785 Hyperlipidemia, unspecified: Secondary | ICD-10-CM | POA: Insufficient documentation

## 2017-01-06 DIAGNOSIS — Z825 Family history of asthma and other chronic lower respiratory diseases: Secondary | ICD-10-CM | POA: Diagnosis not present

## 2017-01-06 DIAGNOSIS — Z7984 Long term (current) use of oral hypoglycemic drugs: Secondary | ICD-10-CM | POA: Insufficient documentation

## 2017-01-06 DIAGNOSIS — Z807 Family history of other malignant neoplasms of lymphoid, hematopoietic and related tissues: Secondary | ICD-10-CM | POA: Insufficient documentation

## 2017-01-06 DIAGNOSIS — Z8349 Family history of other endocrine, nutritional and metabolic diseases: Secondary | ICD-10-CM | POA: Diagnosis not present

## 2017-01-06 DIAGNOSIS — I69351 Hemiplegia and hemiparesis following cerebral infarction affecting right dominant side: Secondary | ICD-10-CM | POA: Diagnosis not present

## 2017-01-06 DIAGNOSIS — D649 Anemia, unspecified: Secondary | ICD-10-CM | POA: Diagnosis not present

## 2017-01-06 DIAGNOSIS — D0512 Intraductal carcinoma in situ of left breast: Secondary | ICD-10-CM | POA: Diagnosis not present

## 2017-01-06 DIAGNOSIS — Z87891 Personal history of nicotine dependence: Secondary | ICD-10-CM | POA: Insufficient documentation

## 2017-01-06 DIAGNOSIS — Z8371 Family history of colonic polyps: Secondary | ICD-10-CM | POA: Insufficient documentation

## 2017-01-06 DIAGNOSIS — Z8249 Family history of ischemic heart disease and other diseases of the circulatory system: Secondary | ICD-10-CM | POA: Insufficient documentation

## 2017-01-06 DIAGNOSIS — N6320 Unspecified lump in the left breast, unspecified quadrant: Secondary | ICD-10-CM | POA: Diagnosis present

## 2017-01-06 DIAGNOSIS — Z8639 Personal history of other endocrine, nutritional and metabolic disease: Secondary | ICD-10-CM | POA: Diagnosis not present

## 2017-01-06 HISTORY — PX: BREAST LUMPECTOMY: SHX2

## 2017-01-06 HISTORY — PX: BREAST LUMPECTOMY WITH NEEDLE LOCALIZATION: SHX5759

## 2017-01-06 LAB — GLUCOSE, CAPILLARY: Glucose-Capillary: 126 mg/dL — ABNORMAL HIGH (ref 65–99)

## 2017-01-06 SURGERY — BREAST LUMPECTOMY WITH NEEDLE LOCALIZATION
Anesthesia: General | Site: Breast | Laterality: Left | Wound class: Clean

## 2017-01-06 MED ORDER — FENTANYL CITRATE (PF) 100 MCG/2ML IJ SOLN
INTRAMUSCULAR | Status: DC | PRN
  Administered 2017-01-06 (×7): 25 ug via INTRAVENOUS
  Administered 2017-01-06: 75 ug via INTRAVENOUS

## 2017-01-06 MED ORDER — SODIUM CHLORIDE 0.9 % IV SOLN
INTRAVENOUS | Status: DC
  Administered 2017-01-06: 09:00:00 via INTRAVENOUS

## 2017-01-06 MED ORDER — LIDOCAINE HCL (CARDIAC) 20 MG/ML IV SOLN
INTRAVENOUS | Status: DC | PRN
  Administered 2017-01-06: 60 mg via INTRAVENOUS

## 2017-01-06 MED ORDER — BUPIVACAINE-EPINEPHRINE 0.5% -1:200000 IJ SOLN
INTRAMUSCULAR | Status: DC | PRN
  Administered 2017-01-06: 10 mL

## 2017-01-06 MED ORDER — FENTANYL CITRATE (PF) 100 MCG/2ML IJ SOLN: 25.0000 ug | INTRAMUSCULAR | Status: DC | PRN

## 2017-01-06 MED ORDER — BUPIVACAINE-EPINEPHRINE 0.5% -1:200000 IJ SOLN
INTRAMUSCULAR | Status: DC | PRN
  Administered 2017-01-06: 4 mL

## 2017-01-06 MED ORDER — HYDROCODONE-ACETAMINOPHEN 5-325 MG PO TABS: 1.0000 | ORAL_TABLET | ORAL | 0 refills | Status: DC | PRN

## 2017-01-06 MED ORDER — DEXAMETHASONE SODIUM PHOSPHATE 10 MG/ML IJ SOLN
INTRAMUSCULAR | Status: AC
  Filled 2017-01-06: qty 1

## 2017-01-06 MED ORDER — ROCURONIUM BROMIDE 50 MG/5ML IV SOLN
INTRAVENOUS | Status: AC
  Filled 2017-01-06: qty 1

## 2017-01-06 MED ORDER — BUPIVACAINE-EPINEPHRINE (PF) 0.5% -1:200000 IJ SOLN
INTRAMUSCULAR | Status: AC
  Filled 2017-01-06: qty 30

## 2017-01-06 MED ORDER — LIDOCAINE HCL (PF) 2 % IJ SOLN
INTRAMUSCULAR | Status: AC
  Filled 2017-01-06: qty 10

## 2017-01-06 MED ORDER — DEXAMETHASONE SODIUM PHOSPHATE 10 MG/ML IJ SOLN
INTRAMUSCULAR | Status: DC | PRN
  Administered 2017-01-06: 10 mg via INTRAVENOUS

## 2017-01-06 MED ORDER — ONDANSETRON HCL 4 MG/2ML IJ SOLN
INTRAMUSCULAR | Status: AC
  Filled 2017-01-06: qty 2

## 2017-01-06 MED ORDER — PROPOFOL 10 MG/ML IV BOLUS
INTRAVENOUS | Status: DC | PRN
  Administered 2017-01-06: 20 mg via INTRAVENOUS
  Administered 2017-01-06: 160 mg via INTRAVENOUS

## 2017-01-06 MED ORDER — PROPOFOL 10 MG/ML IV BOLUS
INTRAVENOUS | Status: AC
  Filled 2017-01-06: qty 20

## 2017-01-06 MED ORDER — FENTANYL CITRATE (PF) 250 MCG/5ML IJ SOLN
INTRAMUSCULAR | Status: AC
  Filled 2017-01-06: qty 5

## 2017-01-06 MED ORDER — FAMOTIDINE 20 MG PO TABS
20.0000 mg | ORAL_TABLET | Freq: Once | ORAL | Status: AC
  Administered 2017-01-06: 20 mg via ORAL

## 2017-01-06 MED ORDER — ONDANSETRON HCL 4 MG/2ML IJ SOLN: 4.0000 mg | Freq: Once | INTRAMUSCULAR | Status: DC | PRN

## 2017-01-06 MED ORDER — MIDAZOLAM HCL 2 MG/2ML IJ SOLN
INTRAMUSCULAR | Status: AC
  Filled 2017-01-06: qty 2

## 2017-01-06 MED ORDER — MIDAZOLAM HCL 2 MG/2ML IJ SOLN
INTRAMUSCULAR | Status: DC | PRN
  Administered 2017-01-06: 2 mg via INTRAVENOUS

## 2017-01-06 MED ORDER — FAMOTIDINE 20 MG PO TABS
ORAL_TABLET | ORAL | Status: AC
  Filled 2017-01-06: qty 1

## 2017-01-06 MED ORDER — ONDANSETRON HCL 4 MG/2ML IJ SOLN
INTRAMUSCULAR | Status: DC | PRN
  Administered 2017-01-06: 4 mg via INTRAVENOUS

## 2017-01-06 SURGICAL SUPPLY — 29 items
BLADE SURG 15 STRL LF DISP TIS (BLADE) ×1 IMPLANT
BLADE SURG 15 STRL SS (BLADE) ×2
CANISTER SUCT 1200ML W/VALVE (MISCELLANEOUS) ×3 IMPLANT
CHLORAPREP W/TINT 26ML (MISCELLANEOUS) ×3 IMPLANT
DERMABOND ADVANCED (GAUZE/BANDAGES/DRESSINGS) ×2
DERMABOND ADVANCED .7 DNX12 (GAUZE/BANDAGES/DRESSINGS) ×1 IMPLANT
DEVICE DUBIN SPECIMEN MAMMOGRA (MISCELLANEOUS) ×3 IMPLANT
DRAPE LAPAROTOMY 77X122 PED (DRAPES) ×3 IMPLANT
DRSG TELFA 4X3 1S NADH ST (GAUZE/BANDAGES/DRESSINGS) ×3 IMPLANT
ELECT REM PT RETURN 9FT ADLT (ELECTROSURGICAL) ×3
ELECTRODE REM PT RTRN 9FT ADLT (ELECTROSURGICAL) ×1 IMPLANT
GLOVE BIO SURGEON STRL SZ7.5 (GLOVE) ×3 IMPLANT
GOWN STRL REUS W/ TWL LRG LVL3 (GOWN DISPOSABLE) ×3 IMPLANT
GOWN STRL REUS W/TWL LRG LVL3 (GOWN DISPOSABLE) ×6
KIT RM TURNOVER STRD PROC AR (KITS) ×3 IMPLANT
LABEL OR SOLS (LABEL) IMPLANT
MARGIN MAP 10MM (MISCELLANEOUS) ×3 IMPLANT
NEEDLE HYPO 25X1 1.5 SAFETY (NEEDLE) ×3 IMPLANT
PACK BASIN MINOR ARMC (MISCELLANEOUS) ×3 IMPLANT
SUT CHROMIC 3 0 SH 27 (SUTURE) ×3 IMPLANT
SUT CHROMIC 4 0 RB 1X27 (SUTURE) ×3 IMPLANT
SUT ETHILON 3-0 FS-10 30 BLK (SUTURE) ×3
SUT MNCRL 4-0 (SUTURE) ×2
SUT MNCRL 4-0 27XMFL (SUTURE) ×1
SUT MNCRL AB 4-0 PS2 18 (SUTURE) ×3 IMPLANT
SUTURE EHLN 3-0 FS-10 30 BLK (SUTURE) ×1 IMPLANT
SUTURE MNCRL 4-0 27XMF (SUTURE) ×1 IMPLANT
SYR 10ML LL (SYRINGE) ×3 IMPLANT
WATER STERILE IRR 1000ML POUR (IV SOLUTION) IMPLANT

## 2017-01-06 NOTE — Anesthesia Procedure Notes (Signed)
Procedure Name: LMA Insertion Date/Time: 01/06/2017 9:54 AM Performed by: Eben Burow, CRNA Pre-anesthesia Checklist: Patient identified, Emergency Drugs available, Suction available, Patient being monitored and Timeout performed Patient Re-evaluated:Patient Re-evaluated prior to induction Oxygen Delivery Method: Circle system utilized Preoxygenation: Pre-oxygenation with 100% oxygen Induction Type: IV induction LMA: LMA inserted LMA Size: 4.0 Number of attempts: 1 Placement Confirmation: positive ETCO2 and breath sounds checked- equal and bilateral Tube secured with: Tape Dental Injury: Teeth and Oropharynx as per pre-operative assessment

## 2017-01-06 NOTE — Op Note (Signed)
OPERATIVE REPORT  PREOPERATIVE  DIAGNOSIS: .  Flat epithelial atypia of left breast  POSTOPERATIVE DIAGNOSIS: .  Flat epithelial atypia of left breast  PROCEDURE: .  Excision left breast mass  ANESTHESIA:  General  SURGEON: Rochel Brome  MD   INDICATIONS: .  She had recent mammograms demonstrating a cluster of microcalcifications in the lower outer quadrant of the left breast.  Needle biopsy demonstrated flat epithelial atypia.  Excision was recommended for further evaluation and treatment.  She had preop insertion of Kopan's wire.  Mammogram images were reviewed  With the patient on the operating table in the supine position under general anesthesia left arm was placed on the lateral arm support.  The dressing was removed from the inferior lateral aspect of the left breast exposing the Kopan's wire which was cut 2 cm from the skin.  The breast was prepared with ChloraPrep and draped in a sterile manner.  An incision was made just above the inferior mammary fold overlying the site of the biopsy and dissected down to encounter the Kopan's wire.  One bleeding point was suture ligated with 3-0 chromic.  The site of the thick portion of the wire was identified.  A portion of tissue surrounding the barb of the Kopan's wire was excised which was approximately 2 x 2 x 2 cm in dimension.  This was labeled with margin map suturing the markers to mark the medial lateral cranial caudal superficial and deep margins.  There was no grossly apparent mass in the specimen.  The specimen mammogram demonstrated the biopsy marker however it was at the lateral margin.  Therefore the lateral margin was reexcised and the new margin was placed on Telfa and sutured to the Telfa and submitted for routine pathology.  This was discussed with the pathologist..  The wound was inspected and was infiltrated with Sensorcaine with epinephrine.  Subcutaneous tissues were approximated with 4-0 chromic and skin was closed with running  4-0 Monocryl subcuticular suture and Dermabond.  Estimated blood loss was approximately 3 cc.  She was transferred to the recovery room in satisfactory condition.  Rochel Brome MD

## 2017-01-06 NOTE — Anesthesia Post-op Follow-up Note (Signed)
Anesthesia QCDR form completed.        

## 2017-01-06 NOTE — Transfer of Care (Signed)
Immediate Anesthesia Transfer of Care Note  Patient: Melissa Day  Procedure(s) Performed: BREAST LUMPECTOMY WITH NEEDLE LOCALIZATION (Left Breast)  Patient Location: PACU  Anesthesia Type:General  Level of Consciousness: awake, drowsy and patient cooperative  Airway & Oxygen Therapy: Patient Spontanous Breathing and Patient connected to nasal cannula oxygen  Post-op Assessment: Report given to RN and Post -op Vital signs reviewed and stable  Post vital signs: Reviewed and stable  Last Vitals:  Vitals:   01/06/17 0855 01/06/17 1128  BP: (!) 136/92 (!) 144/90  Pulse:  (!) 104  Resp:  15  Temp:  36.4 C  SpO2:  95%    Last Pain:  Vitals:   01/06/17 0848  TempSrc: Tympanic         Complications: No apparent anesthesia complications

## 2017-01-06 NOTE — Discharge Instructions (Addendum)
Take Tylenol or Norco if needed for pain.  May shower and blot dry.  Were bra as desired for comfort and support.     AMBULATORY SURGERY  DISCHARGE INSTRUCTIONS   1) The drugs that you were given will stay in your system until tomorrow so for the next 24 hours you should not:  A) Drive an automobile B) Make any legal decisions C) Drink any alcoholic beverage   2) You may resume regular meals tomorrow.  Today it is better to start with liquids and gradually work up to solid foods.  You may eat anything you prefer, but it is better to start with liquids, then soup and crackers, and gradually work up to solid foods.   3) Please notify your doctor immediately if you have any unusual bleeding, trouble breathing, redness and pain at the surgery site, drainage, fever, or pain not relieved by medication.    4) Additional Instructions:        Please contact your physician with any problems or Same Day Surgery at [phone], Monday through Friday 6 am to 4 pm, or [place] at [facility] number at [phone].

## 2017-01-06 NOTE — Anesthesia Preprocedure Evaluation (Signed)
Anesthesia Evaluation  Patient identified by MRN, date of birth, ID band Patient awake    Reviewed: Allergy & Precautions, H&P , NPO status , Patient's Chart, lab work & pertinent test results, reviewed documented beta blocker date and time   Airway Mallampati: II  TM Distance: >3 FB Neck ROM: full    Dental  (+) Teeth Intact   Pulmonary neg pulmonary ROS, former smoker,    Pulmonary exam normal        Cardiovascular Exercise Tolerance: Good negative cardio ROS Normal cardiovascular exam+ Valvular Problems/Murmurs  Rate:Normal     Neuro/Psych Seizures -, Well Controlled,  PSYCHIATRIC DISORDERS Right upper and lower extremity weakness. JA CVA, Residual Symptoms negative neurological ROS  negative psych ROS   GI/Hepatic negative GI ROS, Neg liver ROS,   Endo/Other  negative endocrine ROSHypothyroidism   Renal/GU Renal diseasenegative Renal ROS  negative genitourinary   Musculoskeletal   Abdominal   Peds  Hematology negative hematology ROS (+) anemia ,   Anesthesia Other Findings   Reproductive/Obstetrics negative OB ROS                             Anesthesia Physical Anesthesia Plan  ASA: III  Anesthesia Plan: General LMA   Post-op Pain Management:    Induction:   PONV Risk Score and Plan: 4 or greater  Airway Management Planned:   Additional Equipment:   Intra-op Plan:   Post-operative Plan:   Informed Consent: I have reviewed the patients History and Physical, chart, labs and discussed the procedure including the risks, benefits and alternatives for the proposed anesthesia with the patient or authorized representative who has indicated his/her understanding and acceptance.     Plan Discussed with: CRNA  Anesthesia Plan Comments:         Anesthesia Quick Evaluation

## 2017-01-06 NOTE — Consult Note (Signed)
She reports no change in overall condition since the office exam.  She had recent findings of microcalcifications in the lower outer quadrant of the left breast and biopsy findings of flat epithelial atypia.  She has had insertion of Kopan's wire in the left breast.  I have reviewed mammogram images demonstrating the proximity of the wire with the biopsy marker in the lower outer quadrant.  The left side was marked YES.  I discussed the plan for excision of left breast mass.

## 2017-01-07 NOTE — Anesthesia Postprocedure Evaluation (Signed)
Anesthesia Post Note  Patient: Melissa Day  Procedure(s) Performed: BREAST LUMPECTOMY WITH NEEDLE LOCALIZATION (Left Breast)  Patient location during evaluation: PACU Anesthesia Type: General Level of consciousness: awake and alert Pain management: pain level controlled Vital Signs Assessment: post-procedure vital signs reviewed and stable Respiratory status: spontaneous breathing, nonlabored ventilation, respiratory function stable and patient connected to nasal cannula oxygen Cardiovascular status: blood pressure returned to baseline and stable Postop Assessment: no apparent nausea or vomiting Anesthetic complications: no     Last Vitals:  Vitals:   01/06/17 1219 01/06/17 1259  BP: 130/72 121/71  Pulse: 95 92  Resp: 18 16  Temp: 36.6 C 37.1 C  SpO2:  94%    Last Pain:  Vitals:   01/07/17 0829  TempSrc:   PainSc: 2                  Molli Barrows

## 2017-01-11 ENCOUNTER — Other Ambulatory Visit: Payer: Self-pay | Admitting: Pathology

## 2017-01-12 LAB — SURGICAL PATHOLOGY

## 2017-01-13 ENCOUNTER — Other Ambulatory Visit: Payer: Self-pay

## 2017-01-13 NOTE — Progress Notes (Signed)
  Oncology Nurse Navigator Documentation  Navigator Location: [facility]Med Onc (01/13/17 1600) Referral date to RadOnc/MedOnc: 01/18/17 (01/13/17 1600) )Navigator Encounter Type: Introductory phone call (01/13/17 1600)   Abnormal Finding Date: 12/13/16 (01/13/17 1600) Confirmed Diagnosis Date: 01/06/17 (01/13/17 1600)                   Barriers/Navigation Needs: Education;Coordination of Care (01/13/17 1600) Education: Understanding Cancer/ Treatment Options;Coping with Diagnosis/ Prognosis (01/13/17 1600)                        Time Spent with Patient: 30 (01/13/17 1600)   Introduced IT trainer to patient's mother.  Patient had a stroke in 2012, and has difficulty understanding information. Plan to accompany to initial Med/Onc visit on 01/18/17 with Dr. Rogue Bussing in [facility].  Patient's sister and 4 paternal aunts have been diagnosed with breast cancer.  Sister, and sunt have had genetic testing with negative results per patient's mother.

## 2017-01-18 ENCOUNTER — Other Ambulatory Visit: Payer: Self-pay

## 2017-01-18 ENCOUNTER — Inpatient Hospital Stay: Payer: Medicare Other | Attending: Internal Medicine | Admitting: Internal Medicine

## 2017-01-18 ENCOUNTER — Encounter: Payer: Self-pay | Admitting: Internal Medicine

## 2017-01-18 DIAGNOSIS — R531 Weakness: Secondary | ICD-10-CM | POA: Diagnosis not present

## 2017-01-18 DIAGNOSIS — E039 Hypothyroidism, unspecified: Secondary | ICD-10-CM | POA: Insufficient documentation

## 2017-01-18 DIAGNOSIS — R4701 Aphasia: Secondary | ICD-10-CM

## 2017-01-18 DIAGNOSIS — Z8673 Personal history of transient ischemic attack (TIA), and cerebral infarction without residual deficits: Secondary | ICD-10-CM | POA: Insufficient documentation

## 2017-01-18 DIAGNOSIS — Z8742 Personal history of other diseases of the female genital tract: Secondary | ICD-10-CM

## 2017-01-18 DIAGNOSIS — Z7984 Long term (current) use of oral hypoglycemic drugs: Secondary | ICD-10-CM | POA: Diagnosis not present

## 2017-01-18 DIAGNOSIS — E7800 Pure hypercholesterolemia, unspecified: Secondary | ICD-10-CM | POA: Insufficient documentation

## 2017-01-18 DIAGNOSIS — Z87891 Personal history of nicotine dependence: Secondary | ICD-10-CM | POA: Diagnosis not present

## 2017-01-18 DIAGNOSIS — Z800 Family history of malignant neoplasm of digestive organs: Secondary | ICD-10-CM | POA: Insufficient documentation

## 2017-01-18 DIAGNOSIS — D0512 Intraductal carcinoma in situ of left breast: Secondary | ICD-10-CM | POA: Diagnosis not present

## 2017-01-18 DIAGNOSIS — Z86010 Personal history of colonic polyps: Secondary | ICD-10-CM | POA: Insufficient documentation

## 2017-01-18 DIAGNOSIS — Z79899 Other long term (current) drug therapy: Secondary | ICD-10-CM | POA: Diagnosis not present

## 2017-01-18 DIAGNOSIS — Z803 Family history of malignant neoplasm of breast: Secondary | ICD-10-CM

## 2017-01-18 DIAGNOSIS — R011 Cardiac murmur, unspecified: Secondary | ICD-10-CM | POA: Diagnosis not present

## 2017-01-18 NOTE — Progress Notes (Signed)
Melissa Day  Patient Care Team: Melissa Harrier, Melissa Day as PCP - General (Internal Medicine)  Melissa COMPLAINTS/PURPOSE OF CONSULTATION: DCIS.  #  Oncology History   #NOV 2018 LEFT BREAST DCIS [s/p Lumpec; Dr.Smith]; 4m;  G-3; margins- NEG; closest- posterior margin-144mSTAGE- pTis pNx ; ER/PR >90%  # hx of CVA [2012; [facility]-right sided deficits/ mild aphasia; [facility]].        Ductal carcinoma in situ (DCIS) of left breast     HISTORY OF PRESENTING ILLNESS:  ChPORTIA WISDOM89.o.  female with a prior history of right-sided stroke; [place] and recommendations for left breast DCIS.  Patient had abnormal screening mammogram-that showed left breast DCIS.  This was further followed by biopsy and surgery-8 mm grade 3 DCIS/pathology as above.  Margin close but negative at 1 mm.   Patient continues to have chronic mild weakness of the right upper and lower extremity; aphasia.  She continues to be on aspirin.   She has multiple family members diagnosed with breast cancer-including sister [as per report sister is negative for genetic mutation]   ROS: A complete 10 point review of system is done which is negative except mentioned above in history of present illness  MEDICAL HISTORY:  Past Medical History:  Diagnosis Date  . Anemia   . Family history of colonic polyps   . H/O: CVA (cerebrovascular accident) 2012   a. cerebral edema, craniotomy, residual right upper & lower limb weakness  . Heart murmur   . High cholesterol   . History of colon polyps   . History of echocardiogram    a. 12/01/2013: EF 50-55%, nl global LV systolic function, mild MR, mildly increased LV posterior wall thickness  . History of stress test    a. 12/01/2013: no significant ischemia, no significant WMA, no EKG changes concerning for ischemia, EF 67%, low risk scan  . Hx of seizure disorder    a. one episode 10 months after CVA, since then on  Keppra   . Hypothyroidism   . Kidney stone   . Seizures ([facility]  [place]  . Thyroid disease     SURGICAL HISTORY: Past Surgical History:  Procedure Laterality Date  . BRAIN SURGERY    . BREAST BIOPSY Left 12/13/2016   path pending  . BREAST LUMPECTOMY WITH NEEDLE LOCALIZATION Left 01/06/2017   Procedure: BREAST LUMPECTOMY WITH NEEDLE LOCALIZATION;  Surgeon: SmLeonie GreenMD;  Location: [facility] ORS;  Service: General;  Laterality: Left;  . COLONOSCOPY WITH PROPOFOL N/A 11/18/2014   Procedure: COLONOSCOPY WITH PROPOFOL;  Surgeon: MaJosefine ClassMD;  [facility];  Service: Endoscopy;  Laterality: N/A;  . CYSTOSCOPY/RETROGRADE/URETEROSCOPY/STONE EXTRACTION WITH BASKET Right 10/29/2014   Procedure: CYSTOSCOPY/URETEROSCOPY/STONE EXTRACTION WITH BASKET;  Surgeon: StFranchot GalloMD;  Location: [facility] ORS;  Service: Urology;  Laterality: Right;  . ESOPHAGOGASTRODUODENOSCOPY (EGD) WITH PROPOFOL N/A 11/18/2014   Procedure: ESOPHAGOGASTRODUODENOSCOPY (EGD) WITH PROPOFOL;  Surgeon: MaJosefine ClassMD;  [facility];  Service: Endoscopy;  Laterality: N/A;  . TONSILLECTOMY      SOCIAL HISTORY: Social History   Socioeconomic History  . Marital status: Divorced    Spouse name: Not on file  . Number of children: Not on file  . Years of education: Not on file  . Highest education level: Not on file  Social Needs  . Financial resource strain: Not on file  . Food insecurity - worry: Not on file  .  Food insecurity - inability: Not on file  . Transportation needs - medical: Not on file  . Transportation needs - non-medical: Not on file  Occupational History  . Not on file  Tobacco Use  . Smoking status: Former Smoker    Packs/day: 1.00    Years: 15.00    Pack years: 15.00    Types: Cigarettes    Last attempt to quit: 12/30/2011    Years since quitting: 5.0  . Smokeless tobacco: Never Used  Substance and Sexual Activity  . Alcohol use: No     Frequency: Never  . Drug use: No  . Sexual activity: No  Other Topics Concern  . Not on file  Social History Narrative  . Not on file    FAMILY HISTORY: Family History  Problem Relation Age of Onset  . Nephrolithiasis Mother   . Breast cancer Mother 38  . Heart disease Father   . CVA Father   . Nephrolithiasis Maternal Grandfather   . Diabetes Maternal Grandfather   . Breast cancer Sister 102       negative for braca (unknown variant mutation per pt's sister)  . Lymphoma Maternal Grandmother   . Breast cancer Paternal Aunt        6 paternal aunts with breast cancer  . Breast cancer Cousin        2 maternal cousins with breast cancer-negative for braca  . Colon cancer Cousin        1'st cousin with colon cancer    ALLERGIES:  has No Known Allergies.  MEDICATIONS:  Current Outpatient Medications  Medication Sig Dispense Refill  . aspirin EC 81 MG tablet Take 81 mg by mouth daily.    Marland Kitchen atorvastatin (LIPITOR) 40 MG tablet Take 1 tablet (40 mg total) by mouth daily. 30 tablet 3  . FLUoxetine (PROZAC) 40 MG capsule Take 40 mg by mouth daily.    Marland Kitchen levETIRAcetam (KEPPRA) 750 MG tablet Take 1 tablet (750 mg total) by mouth 2 (two) times daily. 28 tablet 0  . levothyroxine (SYNTHROID, LEVOTHROID) 137 MCG tablet Take 137 mcg daily before breakfast by mouth.     . metFORMIN (GLUCOPHAGE) 500 MG tablet Take 500 mg by mouth daily with breakfast.    . Doxylamine Succinate, Sleep, (SLEEP AID PO) Take 1 tablet by mouth at bedtime as needed (sleep).    Marland Kitchen HYDROcodone-acetaminophen (NORCO/VICODIN) 5-325 MG tablet Take 1 tablet by mouth at bedtime as needed for pain.     No current facility-administered medications for this visit.       Marland Kitchen  PHYSICAL EXAMINATION: ECOG PERFORMANCE STATUS: 0 - Asymptomatic  Vitals:   01/18/17 1423  BP: 129/84  Pulse: 88  Resp: 18  Temp: 97.6 F (36.4 C)   Filed Weights   01/18/17 1423  Weight: 209 lb 7 oz (95 kg)    GENERAL: Well-nourished  well-developed; Alert, no distress and comfortable.   Accompanied by sister/mother EYES: no pallor or icterus OROPHARYNX: no thrush or ulceration; good dentition  NECK: supple, no masses felt LYMPH:  no palpable lymphadenopathy in the cervical, axillary or inguinal regions LUNGS: clear to auscultation and  No wheeze or crackles HEART/CVS: regular rate & rhythm and no murmurs; No lower extremity edema ABDOMEN: abdomen soft, non-tender and normal bowel sounds Musculoskeletal:no cyanosis of digits and no clubbing  PSYCH: alert & oriented x 3 with fluent speech NEURO: no focal motor/sensory deficits SKIN:  no rashes or significant lesions  Right and left BREAST exam [in  the presence of nurse]- no unusual skin changes or dominant masses felt. Surgical scars noted.    LABORATORY DATA:  I have reviewed the data as listed Lab Results  Component Value Date   WBC 17.8 (H) 10/29/2014   HGB 13.3 10/29/2014   HCT 38.7 10/29/2014   MCV 87.8 10/29/2014   PLT 226 10/29/2014   No results for input(s): NA, K, CL, CO2, GLUCOSE, BUN, CREATININE, CALCIUM, GFRNONAA, GFRAA, PROT, ALBUMIN, AST, ALT, ALKPHOS, BILITOT, BILIDIR, IBILI in the last 8760 hours.  RADIOGRAPHIC STUDIES: I have personally reviewed the radiological images as listed and agreed with the findings in the report. Mm Breast Surgical Specimen  Result Date: 01/06/2017 CLINICAL DATA:  Status post left breast surgery. EXAM: SPECIMEN RADIOGRAPH OF THE LEFT BREAST COMPARISON:  Previous exam(s). FINDINGS: Status post excision of the left breast. The wire tip and biopsy marker clip are present and are marked for pathology. The biopsy clip is seen at the lateral edge of the specimen. IMPRESSION: Specimen radiograph of the left breast. Electronically Signed   By: Abelardo Diesel M.D.   On: 01/06/2017 11:21   Mm Lt Plc Breast Loc Dev   1st Lesion  Inc Mammo Guide  Result Date: 01/06/2017 CLINICAL DATA:  Needle localization for left breast flat  epithelial atypia associated calcifications. EXAM: NEEDLE LOCALIZATION OF THE LEFT BREAST WITH MAMMO GUIDANCE COMPARISON:  Previous exams. FINDINGS: Patient presents for needle localization prior to left breast surgery. I met with the patient and we discussed the procedure of needle localization including benefits and alternatives. We discussed the high likelihood of a successful procedure. We discussed the risks of the procedure, including infection, bleeding, tissue injury, and further surgery. Informed, written consent was given. The usual time-out protocol was performed immediately prior to the procedure. Using mammographic guidance, sterile technique, 1% lidocaine and a 11 cm modified Kopans needle, the X shaped biopsy clip was localized using lateral approach. The images were marked for Dr. Tamala Julian. IMPRESSION: Needle localization left breast. No apparent complications. Electronically Signed   By: Abelardo Diesel M.D.   On: 01/06/2017 08:42    ASSESSMENT & PLAN:   Ductal carcinoma in situ (DCIS) of left breast # Left breast DCIS-negative margin; margin 1 mm. Grade 3; ER PR positive.  Given the need for at least 2 mm negative margin patient is awaiting reexcision next week.  Recommend radiation; referral to radiation.  #Long discussion regarding primary prophylaxis-using tamoxifen.  However given the history of stroke I would avoid using tamoxifen.  Discussed the possible use of aromatase inhibitors; however as patient is premenopausal-she will need to be on ovarian suppression.  Again as this is DCIS; and not invasive cancer-I think is reasonable to hold off any anti-hormonal therapy at this time.  # Not candidate for COMET trial-given high-grade DCIS.  Discussed with clinical trials nurse.   #History of stroke/left sided CVA/mild aphasia-currently on aspirin.   #Significant history of breast cancer in the family-will be a candidate for genetic testing/genetic counseling; will make a referral.  #  follow up in 3 months/referral to Dr.Crystal.  Also discussed at the tumor conference.  # Thank you Dr.Smith for allowing me to participate in the care of your pleasant patient. Please do not hesitate to contact me with questions or concerns in the interim.   All questions were answered. The patient knows to call the clinic with any problems, questions or concerns.    Cammie Sickle, Melissa Day 01/21/2017 1:36 PM

## 2017-01-18 NOTE — Assessment & Plan Note (Addendum)
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 Grade 3; ER PR positive.  Given the need for at least 2 mm negative margin patient is awaiting reexcision next week.  Recommend radiation; referral to radiation.  #Long discussion regarding primary prophylaxis-using tamoxifen.  However given the history of stroke I would avoid using tamoxifen.  Discussed the possible use of aromatase inhibitors; however as patient is premenopausal-she will need to be on ovarian suppression.  Again as this is DCIS; and not invasive cancer-I think is reasonable to hold off any anti-hormonal therapy at this time.  # Not candidate for COMET trial-given high-grade DCIS.  Discussed with clinical trials nurse.   #History of stroke/left sided CVA/mild aphasia-currently on aspirin.   #Significant history of breast cancer in the family-will be a candidate for genetic testing/genetic counseling; will make a referral.  # follow up in 3 months/referral to Dr.Crystal.  Also discussed at the tumor conference.  # Thank you Dr.Smith for allowing me to participate in the care of your pleasant patient. Please do not hesitate to contact me with questions or concerns in the interim.

## 2017-01-25 ENCOUNTER — Telehealth: Payer: Self-pay | Admitting: Genetic Counselor

## 2017-01-25 ENCOUNTER — Ambulatory Visit
Admission: RE | Admit: 2017-01-25 | Discharge: 2017-01-25 | Disposition: A | Discharge status: Home / Self Care | Payer: Medicare Other | Source: Ambulatory Visit | Attending: Surgery | Admitting: Surgery

## 2017-01-25 ENCOUNTER — Ambulatory Visit: Payer: Medicare Other | Admitting: Anesthesiology

## 2017-01-25 ENCOUNTER — Encounter
Admission: RE | Disposition: A | Discharge status: Home / Self Care | Payer: Self-pay | Source: Ambulatory Visit | Attending: Surgery

## 2017-01-25 ENCOUNTER — Other Ambulatory Visit: Payer: Self-pay

## 2017-01-25 ENCOUNTER — Encounter: Payer: Self-pay | Admitting: Anesthesiology

## 2017-01-25 DIAGNOSIS — E785 Hyperlipidemia, unspecified: Secondary | ICD-10-CM | POA: Diagnosis not present

## 2017-01-25 DIAGNOSIS — K529 Noninfective gastroenteritis and colitis, unspecified: Secondary | ICD-10-CM | POA: Insufficient documentation

## 2017-01-25 DIAGNOSIS — Z87891 Personal history of nicotine dependence: Secondary | ICD-10-CM | POA: Diagnosis not present

## 2017-01-25 DIAGNOSIS — R011 Cardiac murmur, unspecified: Secondary | ICD-10-CM | POA: Diagnosis not present

## 2017-01-25 DIAGNOSIS — D0512 Intraductal carcinoma in situ of left breast: Secondary | ICD-10-CM | POA: Diagnosis not present

## 2017-01-25 DIAGNOSIS — Z7982 Long term (current) use of aspirin: Secondary | ICD-10-CM | POA: Insufficient documentation

## 2017-01-25 DIAGNOSIS — I69351 Hemiplegia and hemiparesis following cerebral infarction affecting right dominant side: Secondary | ICD-10-CM | POA: Insufficient documentation

## 2017-01-25 DIAGNOSIS — N6489 Other specified disorders of breast: Secondary | ICD-10-CM | POA: Insufficient documentation

## 2017-01-25 DIAGNOSIS — Z79899 Other long term (current) drug therapy: Secondary | ICD-10-CM | POA: Insufficient documentation

## 2017-01-25 DIAGNOSIS — E039 Hypothyroidism, unspecified: Secondary | ICD-10-CM | POA: Insufficient documentation

## 2017-01-25 DIAGNOSIS — F329 Major depressive disorder, single episode, unspecified: Secondary | ICD-10-CM | POA: Diagnosis not present

## 2017-01-25 HISTORY — PX: RE-EXCISION OF BREAST LUMPECTOMY: SHX6048

## 2017-01-25 LAB — POCT PREGNANCY, URINE: Preg Test, Ur: NEGATIVE

## 2017-01-25 LAB — GLUCOSE, CAPILLARY
GLUCOSE-CAPILLARY: 127 mg/dL — AB (ref 65–99)
Glucose-Capillary: 127 mg/dL — ABNORMAL HIGH (ref 65–99)

## 2017-01-25 SURGERY — EXCISION, LESION, BREAST
Anesthesia: General | Laterality: Left | Wound class: Clean

## 2017-01-25 MED ORDER — PROPOFOL 10 MG/ML IV BOLUS
INTRAVENOUS | Status: AC
  Filled 2017-01-25: qty 20

## 2017-01-25 MED ORDER — SODIUM CHLORIDE 0.9 % IV SOLN
INTRAVENOUS | Status: DC
  Administered 2017-01-25 (×2): via INTRAVENOUS

## 2017-01-25 MED ORDER — ONDANSETRON HCL 4 MG/2ML IJ SOLN
INTRAMUSCULAR | Status: AC
  Filled 2017-01-25: qty 2

## 2017-01-25 MED ORDER — PROPOFOL 10 MG/ML IV BOLUS
INTRAVENOUS | Status: DC | PRN
  Administered 2017-01-25: 150 mg via INTRAVENOUS

## 2017-01-25 MED ORDER — HYDROMORPHONE HCL 1 MG/ML IJ SOLN
INTRAMUSCULAR | Status: DC | PRN
  Administered 2017-01-25: .4 mg via INTRAVENOUS

## 2017-01-25 MED ORDER — LACTATED RINGERS IV SOLN
INTRAVENOUS | Status: DC | PRN
  Administered 2017-01-25: 08:00:00 via INTRAVENOUS

## 2017-01-25 MED ORDER — FAMOTIDINE 20 MG PO TABS
ORAL_TABLET | ORAL | Status: AC
Start: 2017-01-25 — End: 2017-01-25
  Administered 2017-01-25: 20 mg via ORAL
  Filled 2017-01-25: qty 1

## 2017-01-25 MED ORDER — BUPIVACAINE-EPINEPHRINE (PF) 0.5% -1:200000 IJ SOLN
INTRAMUSCULAR | Status: AC
  Filled 2017-01-25: qty 30

## 2017-01-25 MED ORDER — MIDAZOLAM HCL 2 MG/2ML IJ SOLN
INTRAMUSCULAR | Status: DC | PRN
Start: 2017-01-25 — End: 2017-01-25
  Administered 2017-01-25: 2 mg via INTRAVENOUS

## 2017-01-25 MED ORDER — GLYCOPYRROLATE 0.2 MG/ML IJ SOLN
INTRAMUSCULAR | Status: DC | PRN
  Administered 2017-01-25: 0.2 mg via INTRAVENOUS

## 2017-01-25 MED ORDER — HYDROMORPHONE HCL 1 MG/ML IJ SOLN
INTRAMUSCULAR | Status: AC
  Filled 2017-01-25: qty 1

## 2017-01-25 MED ORDER — FENTANYL CITRATE (PF) 100 MCG/2ML IJ SOLN: 25.0000 ug | INTRAMUSCULAR | Status: DC | PRN

## 2017-01-25 MED ORDER — HYDROCODONE-ACETAMINOPHEN 5-325 MG PO TABS: 1.0000 | ORAL_TABLET | Freq: Four times a day (QID) | ORAL | 0 refills | Status: DC | PRN

## 2017-01-25 MED ORDER — FENTANYL CITRATE (PF) 100 MCG/2ML IJ SOLN
INTRAMUSCULAR | Status: DC | PRN
  Administered 2017-01-25 (×3): 50 ug via INTRAVENOUS

## 2017-01-25 MED ORDER — ONDANSETRON HCL 4 MG/2ML IJ SOLN: 4.0000 mg | Freq: Once | INTRAMUSCULAR | Status: DC | PRN

## 2017-01-25 MED ORDER — FENTANYL CITRATE (PF) 250 MCG/5ML IJ SOLN
INTRAMUSCULAR | Status: AC
  Filled 2017-01-25: qty 5

## 2017-01-25 MED ORDER — FAMOTIDINE 20 MG PO TABS
20.0000 mg | ORAL_TABLET | Freq: Once | ORAL | Status: AC
  Administered 2017-01-25: 20 mg via ORAL

## 2017-01-25 MED ORDER — MIDAZOLAM HCL 2 MG/2ML IJ SOLN
INTRAMUSCULAR | Status: AC
  Filled 2017-01-25: qty 2

## 2017-01-25 MED ORDER — LACTATED RINGERS IV SOLN: INTRAVENOUS | Status: DC

## 2017-01-25 MED ORDER — ONDANSETRON HCL 4 MG/2ML IJ SOLN
INTRAMUSCULAR | Status: DC | PRN
  Administered 2017-01-25: 4 mg via INTRAVENOUS

## 2017-01-25 MED ORDER — BUPIVACAINE-EPINEPHRINE 0.5% -1:200000 IJ SOLN
INTRAMUSCULAR | Status: DC | PRN
  Administered 2017-01-25: 12 mL

## 2017-01-25 MED ORDER — DEXAMETHASONE SODIUM PHOSPHATE 10 MG/ML IJ SOLN
INTRAMUSCULAR | Status: DC | PRN
  Administered 2017-01-25: 10 mg via INTRAVENOUS

## 2017-01-25 MED ORDER — LIDOCAINE HCL (CARDIAC) 20 MG/ML IV SOLN
INTRAVENOUS | Status: DC | PRN
  Administered 2017-01-25: 100 mg via INTRAVENOUS

## 2017-01-25 SURGICAL SUPPLY — 23 items
CANISTER SUCT 1200ML W/VALVE (MISCELLANEOUS) ×3 IMPLANT
CHLORAPREP W/TINT 26ML (MISCELLANEOUS) ×3 IMPLANT
CNTNR SPEC 2.5X3XGRAD LEK (MISCELLANEOUS) ×1
CONT SPEC 4OZ STER OR WHT (MISCELLANEOUS) ×2
CONTAINER SPEC 2.5X3XGRAD LEK (MISCELLANEOUS) ×1 IMPLANT
DERMABOND ADVANCED (GAUZE/BANDAGES/DRESSINGS) ×2
DERMABOND ADVANCED .7 DNX12 (GAUZE/BANDAGES/DRESSINGS) ×1 IMPLANT
DRAPE LAPAROTOMY 77X122 PED (DRAPES) ×3 IMPLANT
ELECT REM PT RETURN 9FT ADLT (ELECTROSURGICAL) ×3
ELECTRODE REM PT RTRN 9FT ADLT (ELECTROSURGICAL) ×1 IMPLANT
GLOVE BIO SURGEON STRL SZ7.5 (GLOVE) ×9 IMPLANT
GOWN STRL REUS W/ TWL LRG LVL3 (GOWN DISPOSABLE) ×2 IMPLANT
GOWN STRL REUS W/TWL LRG LVL3 (GOWN DISPOSABLE) ×4
KIT RM TURNOVER STRD PROC AR (KITS) ×3 IMPLANT
LABEL OR SOLS (LABEL) ×3 IMPLANT
NEEDLE HYPO 25X1 1.5 SAFETY (NEEDLE) ×3 IMPLANT
PACK BASIN MINOR ARMC (MISCELLANEOUS) ×3 IMPLANT
SUT CHROMIC 4 0 RB 1X27 (SUTURE) ×3 IMPLANT
SUT MNCRL 4-0 (SUTURE) ×2
SUT MNCRL 4-0 27XMFL (SUTURE) ×1
SUTURE MNCRL 4-0 27XMF (SUTURE) ×1 IMPLANT
SYR 10ML LL (SYRINGE) ×3 IMPLANT
WATER STERILE IRR 1000ML POUR (IV SOLUTION) ×3 IMPLANT

## 2017-01-25 NOTE — Anesthesia Procedure Notes (Signed)
Procedure Name: LMA Insertion Date/Time: 01/25/2017 7:48 AM Performed by: Justus Memory, CRNA Pre-anesthesia Checklist: Patient identified, Patient being monitored, Timeout performed, Emergency Drugs available and Suction available Patient Re-evaluated:Patient Re-evaluated prior to induction Oxygen Delivery Method: Circle system utilized Preoxygenation: Pre-oxygenation with 100% oxygen Induction Type: IV induction Ventilation: Mask ventilation without difficulty LMA: LMA inserted LMA Size: 4.5 Tube type: Oral Number of attempts: 1 Placement Confirmation: positive ETCO2 and breath sounds checked- equal and bilateral Tube secured with: Tape Dental Injury: Teeth and Oropharynx as per pre-operative assessment

## 2017-01-25 NOTE — Anesthesia Postprocedure Evaluation (Signed)
Anesthesia Post Note  Patient: Melissa Day  Procedure(s) Performed: RE-EXCISION OF BREAST LUMPECTOMY (Left )  Patient location during evaluation: PACU Anesthesia Type: General Level of consciousness: awake and alert Pain management: pain level controlled Vital Signs Assessment: post-procedure vital signs reviewed and stable Respiratory status: spontaneous breathing, nonlabored ventilation, respiratory function stable and patient connected to nasal cannula oxygen Cardiovascular status: blood pressure returned to baseline and stable Postop Assessment: no apparent nausea or vomiting Anesthetic complications: no     Last Vitals:  Vitals:   01/25/17 0944 01/25/17 0952  BP: 125/81 (!) 152/61  Pulse: 85 87  Resp: 14 16  Temp: (!) 36.3 C 36.6 C  SpO2: 92% 100%    Last Pain:  Vitals:   01/25/17 0952  TempSrc: Temporal  PainSc:                  Abbye Lao S

## 2017-01-25 NOTE — H&P (Signed)
She comes today for re-excision of ductal carcinoma in situ of left breast.  Left side marked YES.  Discussed plan for surgery.

## 2017-01-25 NOTE — Discharge Instructions (Addendum)
Take Tylenol or Norco if needed for pain.  Resume aspirin on Wednesday.  May shower and blot dry.  Wear bra as desired for comfort and support.   AMBULATORY SURGERY  DISCHARGE INSTRUCTIONS   1) The drugs that you were given will stay in your system until tomorrow so for the next 24 hours you should not:  A) Drive an automobile B) Make any legal decisions C) Drink any alcoholic beverage   2) You may resume regular meals tomorrow.  Today it is better to start with liquids and gradually work up to solid foods.  You may eat anything you prefer, but it is better to start with liquids, then soup and crackers, and gradually work up to solid foods.   3) Please notify your doctor immediately if you have any unusual bleeding, trouble breathing, redness and pain at the surgery site, drainage, fever, or pain not relieved by medication. 4) Additional Instructions:  Please contact your physician with any problems or Same Day Surgery at [phone], Monday through Friday 6 am to 4 pm, or [place] at [facility] number at [phone].

## 2017-01-25 NOTE — Telephone Encounter (Addendum)
Two additional messages were left for Melissa Day (12/20 and 12/21). She is welcome to call when she is ready to schedule this appointment for her sister. Closing the referral at this time.  -----------------------------------------------------------------  Melissa Day was referred for genetic counseling by Dr. Rogue Bussing due to a personal and family history of breast cancer. Per the referral, this needs to be scheduled through her sister, Melissa Day, who is her caretaker. I left her a message to call and schedule this telegenetics visit to be done by phone at their convenience.   Steele Berg, [place], [place] Genetic Counselor Phone: [phone]

## 2017-01-25 NOTE — Anesthesia Post-op Follow-up Note (Signed)
Anesthesia QCDR form completed.        

## 2017-01-25 NOTE — Transfer of Care (Signed)
2Immediate Anesthesia Transfer of Care Note  Patient: Melissa Day  Procedure(s) Performed:  Patient Location: PACU  Anesthesia Type:General  Level of Consciousness: sedated  Airway & Oxygen Therapy: Patient Spontanous Breathing and Patient connected to face mask oxygen  Post-op Assessment: Report given to RN and Post -op Vital signs reviewed and stable  Post vital signs: Reviewed and stable  Last Vitals:  Vitals:   01/25/17 0628 01/25/17 0913  BP: (!) 152/89 131/85  Pulse: 81 (!) 106  Resp: 16 (!) (P) 9  Temp: 36.5 C (!) (P) 36.2 C  SpO2: 97% 95%    Last Pain:  Vitals:   01/25/17 0628  TempSrc: Tympanic  PainSc: 0-No pain         Complications: No apparent anesthesia complications

## 2017-01-25 NOTE — Anesthesia Preprocedure Evaluation (Addendum)
Anesthesia Evaluation  Patient identified by MRN, date of birth, ID band Patient awake    Reviewed: Allergy & Precautions, NPO status , Patient's Chart, lab work & pertinent test results, reviewed documented beta blocker date and time   Airway Mallampati: III  TM Distance: >3 FB     Dental  (+) Chipped   Pulmonary former smoker,           Cardiovascular + Valvular Problems/Murmurs      Neuro/Psych Seizures -,  PSYCHIATRIC DISORDERS Depression CVA, Residual Symptoms    GI/Hepatic   Endo/Other  Hypothyroidism   Renal/GU Renal disease     Musculoskeletal   Abdominal   Peds  Hematology  (+) anemia ,   Anesthesia Other Findings R hemiplegia. Echo and stress test ok. No seizures now. LMA should be safe for her.  Reproductive/Obstetrics                            Anesthesia Physical Anesthesia Plan  ASA: III  Anesthesia Plan: General   Post-op Pain Management:    Induction: Intravenous  PONV Risk Score and Plan:   Airway Management Planned: LMA  Additional Equipment:   Intra-op Plan:   Post-operative Plan:   Informed Consent: I have reviewed the patients History and Physical, chart, labs and discussed the procedure including the risks, benefits and alternatives for the proposed anesthesia with the patient or authorized representative who has indicated his/her understanding and acceptance.     Plan Discussed with: CRNA  Anesthesia Plan Comments:        Anesthesia Quick Evaluation

## 2017-01-25 NOTE — Op Note (Signed)
OPERATIVE REPORT  PREOPERATIVE  DIAGNOSIS: .  Ductal carcinoma in situ of left breast  POSTOPERATIVE DIAGNOSIS: .  Ductal carcinoma in situ of left breast  PROCEDURE: .  Reexcision of left breast cancer  ANESTHESIA:  General  SURGEON: Rochel Brome  MD   INDICATIONS: .  She had recent excision of left breast mass.  Her final pathology demonstrated ductal carcinoma in situ with a 1 mm posterior margin.  She had oncology consultation and reexcision was recommended for further treatment.  With the patient on the operating table in the supine position she was placed under general anesthesia.  The left arm was placed on the lateral arm support.  The left breast was prepared with ChloraPrep and draped in a sterile manner  An incision was made at the old scar at the inferior mammary fold oriented transversely.  This incision was carried down through subcutaneous tissues through scar tissue and encountered a seroma which was aspirated.  The seroma was further opened.  Next an excision was carried out removing the seroma and surrounding tissue specifically including the posterior margin.  There was no grossly apparent mass.  There were 2 significant bleeding points which were encountered which were controlled with hemostats.  The posterior margin was tagged with 2 nylon sutures.  This was submitted fresh for routine pathology.  The wound was inspected.  2 clamped vessels were suture ligated with 4-0 chromic.  Several small bleeding points were cauterized.  Hemostasis was subsequently intact.  The subcuticular tissues were infiltrated with 0.5% Sensorcaine with epinephrine.  Also deeper tissues surrounding cautery artifact were infiltrated.  The wound was closed with interrupted 4-0 chromic subcutaneous sutures and running 4-0 Monocryl subcuticular suture and Dermabond.  Estimated blood loss 100 cc.  The patient tolerated surgery satisfactorily and was prepared for transfer to the recovery room  Rochel Brome MD

## 2017-01-25 NOTE — Addendum Note (Signed)
Addendum  created 01/25/17 1318 by Justus Memory, CRNA   Intraprocedure Flowsheets edited, Intraprocedure Meds edited

## 2017-01-26 ENCOUNTER — Encounter: Payer: Self-pay | Admitting: *Deleted

## 2017-01-26 ENCOUNTER — Encounter: Payer: Self-pay | Admitting: Surgery

## 2017-01-26 LAB — SURGICAL PATHOLOGY

## 2017-02-08 DIAGNOSIS — Z923 Personal history of irradiation: Secondary | ICD-10-CM

## 2017-02-08 HISTORY — DX: Personal history of irradiation: Z92.3

## 2017-02-16 ENCOUNTER — Ambulatory Visit
Admission: RE | Admit: 2017-02-16 | Discharge: 2017-02-16 | Disposition: A | Discharge status: Home / Self Care | Payer: Medicare Other | Source: Ambulatory Visit | Attending: Radiation Oncology | Admitting: Radiation Oncology

## 2017-02-16 ENCOUNTER — Other Ambulatory Visit: Payer: Self-pay

## 2017-02-16 ENCOUNTER — Encounter: Payer: Self-pay | Admitting: Radiation Oncology

## 2017-02-16 VITALS — BP 117/81 | HR 82 | Temp 97.5°F | Resp 18 | Wt 210.6 lb

## 2017-02-16 DIAGNOSIS — Z170 Estrogen receptor positive status [ER+]: Secondary | ICD-10-CM | POA: Diagnosis not present

## 2017-02-16 DIAGNOSIS — G40909 Epilepsy, unspecified, not intractable, without status epilepticus: Secondary | ICD-10-CM | POA: Insufficient documentation

## 2017-02-16 DIAGNOSIS — R4701 Aphasia: Secondary | ICD-10-CM | POA: Insufficient documentation

## 2017-02-16 DIAGNOSIS — E039 Hypothyroidism, unspecified: Secondary | ICD-10-CM | POA: Insufficient documentation

## 2017-02-16 DIAGNOSIS — Z7984 Long term (current) use of oral hypoglycemic drugs: Secondary | ICD-10-CM | POA: Diagnosis not present

## 2017-02-16 DIAGNOSIS — E7800 Pure hypercholesterolemia, unspecified: Secondary | ICD-10-CM | POA: Diagnosis not present

## 2017-02-16 DIAGNOSIS — Z87891 Personal history of nicotine dependence: Secondary | ICD-10-CM | POA: Diagnosis not present

## 2017-02-16 DIAGNOSIS — D649 Anemia, unspecified: Secondary | ICD-10-CM | POA: Diagnosis not present

## 2017-02-16 DIAGNOSIS — Z800 Family history of malignant neoplasm of digestive organs: Secondary | ICD-10-CM | POA: Diagnosis not present

## 2017-02-16 DIAGNOSIS — Z86010 Personal history of colonic polyps: Secondary | ICD-10-CM | POA: Insufficient documentation

## 2017-02-16 DIAGNOSIS — Z79899 Other long term (current) drug therapy: Secondary | ICD-10-CM | POA: Insufficient documentation

## 2017-02-16 DIAGNOSIS — Z87442 Personal history of urinary calculi: Secondary | ICD-10-CM | POA: Insufficient documentation

## 2017-02-16 DIAGNOSIS — I69351 Hemiplegia and hemiparesis following cerebral infarction affecting right dominant side: Secondary | ICD-10-CM | POA: Diagnosis not present

## 2017-02-16 DIAGNOSIS — D0512 Intraductal carcinoma in situ of left breast: Secondary | ICD-10-CM | POA: Insufficient documentation

## 2017-02-16 DIAGNOSIS — Z510 Encounter for antineoplastic radiation therapy: Secondary | ICD-10-CM | POA: Diagnosis not present

## 2017-02-16 DIAGNOSIS — R011 Cardiac murmur, unspecified: Secondary | ICD-10-CM | POA: Diagnosis not present

## 2017-02-16 DIAGNOSIS — Z803 Family history of malignant neoplasm of breast: Secondary | ICD-10-CM | POA: Diagnosis not present

## 2017-02-16 DIAGNOSIS — Z8669 Personal history of other diseases of the nervous system and sense organs: Secondary | ICD-10-CM | POA: Diagnosis not present

## 2017-02-16 NOTE — Consult Note (Signed)
NEW PATIENT EVALUATION  Name: Melissa Day  MRN: [id number]  Date:   02/16/2017     DOB: April 05, 1970   This 47 y.o. 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  REFERRING PHYSICIAN: Tracie Harrier, MD  CHIEF COMPLAINT:  Chief Complaint  Patient presents with  . Breast Cancer    Initial Evaluation    DIAGNOSIS: The encounter diagnosis was Ductal carcinoma in situ (DCIS) of left breast.   PREVIOUS INVESTIGATIONS:  Pathology reports reviewed Mammograms ultrasound reviewed Clinical notes reviewed  HPI: Patient is a 47 year old female with history of CVA causing right-sided weakness and aphasia. She was noted to have an abnormal screening mammogram showing a 5 mm group of fine pleomorphic calcifications in the lower outer quadrant of the left breast at posterior depth. Core biopsy was performed although negative for malignancy. She underwent a stereotactic guided wide local excision showing nuclear grade 3 ductal carcinoma in situ. Margins were close at 1 mm and she underwent reexcision showing no evidence of residual disease. Tumor was strongly ER/PR positive. She's been seen by medical oncology and based on anti-estrogen therapy is to be decided based on her history of CVA. She is seen today and is doing well. Her sister is a former patient of mine also with ductal carcinoma in situ. She specifically denies breast tenderness cough or bone pain. She does have some profound weakness on the right side left upper extremity is fine.   PLANNED TREATMENT REGIMEN: Hypofractionated course of whole breast radiation to her left breast  PAST MEDICAL HISTORY:  has a past medical history of Anemia, Family history of colonic polyps, H/O: CVA (cerebrovascular accident) (2012), Heart murmur, High  cholesterol, History of colon polyps, History of echocardiogram, History of stress test, seizure disorder, Hypothyroidism, Kidney stone, Seizures ([place]), Stroke ([place]), and Thyroid disease.    PAST SURGICAL HISTORY:  Past Surgical History:  Procedure Laterality Date  . BRAIN SURGERY    . BREAST BIOPSY Left 12/13/2016   path pending  . BREAST LUMPECTOMY WITH NEEDLE LOCALIZATION Left 01/06/2017   Procedure: BREAST LUMPECTOMY WITH NEEDLE LOCALIZATION;  Surgeon: Leonie Green, MD;  Location: [facility] ORS;  Service: General;  Laterality: Left;  . COLONOSCOPY WITH PROPOFOL N/A 11/18/2014   Procedure: COLONOSCOPY WITH PROPOFOL;  Surgeon: Josefine Class, MD;  Location: [facility] ENDOSCOPY;  Service: Endoscopy;  Laterality: N/A;  . CYSTOSCOPY/RETROGRADE/URETEROSCOPY/STONE EXTRACTION WITH BASKET Right 10/29/2014   Procedure: CYSTOSCOPY/URETEROSCOPY/STONE EXTRACTION WITH BASKET;  Surgeon: Franchot Gallo, MD;  Location: [facility] ORS;  Service: Urology;  Laterality: Right;  . ESOPHAGOGASTRODUODENOSCOPY (EGD) WITH PROPOFOL N/A 11/18/2014   Procedure: ESOPHAGOGASTRODUODENOSCOPY (EGD) WITH PROPOFOL;  Surgeon: Josefine Class, MD;  Location: [facility] ENDOSCOPY;  Service: Endoscopy;  Laterality: N/A;  . RE-EXCISION OF BREAST LUMPECTOMY Left 01/25/2017   Procedure: RE-EXCISION OF BREAST LUMPECTOMY;  Surgeon: Leonie Green, MD;  Location: [facility] ORS;  Service: General;  Laterality: Left;  . TONSILLECTOMY      FAMILY HISTORY: family history includes Breast cancer in her cousin and paternal aunt; Breast cancer (age of onset: 52) in her sister; Breast cancer (age of onset: 76) in her mother; CVA in her father; Colon cancer in her cousin; Diabetes in her maternal grandfather; Heart disease in her father; Lymphoma in her maternal grandmother; Nephrolithiasis in her maternal grandfather and mother.  SOCIAL HISTORY:  reports that she quit smoking about 5 years ago. Her smoking use included cigarettes. She has a  15.00 pack-year smoking history. she has never used smokeless tobacco. She reports that she does not drink alcohol or use drugs.  ALLERGIES: Patient has no known allergies.  MEDICATIONS:  Current Outpatient Medications  Medication Sig Dispense Refill  . aspirin EC 81 MG tablet Take 81 mg by mouth daily.    Marland Kitchen atorvastatin (LIPITOR) 40 MG tablet Take 1 tablet (40 mg total) by mouth daily. 30 tablet 3  . Doxylamine Succinate, Sleep, (SLEEP AID PO) Take 1 tablet by mouth at bedtime as needed (sleep).    Marland Kitchen FLUoxetine (PROZAC) 40 MG capsule Take 40 mg by mouth daily.    Marland Kitchen HYDROcodone-acetaminophen (NORCO) 5-325 MG tablet Take 1-2 tablets by mouth every 6 (six) hours as needed for moderate pain. 12 tablet 0  . HYDROcodone-acetaminophen (NORCO/VICODIN) 5-325 MG tablet Take 1 tablet by mouth at bedtime as needed for pain.    Marland Kitchen levETIRAcetam (KEPPRA) 750 MG tablet Take 1 tablet (750 mg total) by mouth 2 (two) times daily. 28 tablet 0  . levothyroxine (SYNTHROID, LEVOTHROID) 137 MCG tablet Take 137 mcg daily before breakfast by mouth.     . metFORMIN (GLUCOPHAGE) 500 MG tablet Take 500 mg by mouth daily with breakfast.     No current facility-administered medications for this encounter.     ECOG PERFORMANCE STATUS:  0 - Asymptomatic  REVIEW OF SYSTEMS: Except for the history of CVA and aphasia with profound upper and lower extremity right-sided weakness Patient denies any weight loss, fatigue, weakness, fever, chills or night sweats. Patient denies any loss of vision, blurred vision. Patient denies any ringing  of the ears or hearing loss. No irregular heartbeat. Patient denies heart murmur or history of fainting. Patient denies any chest pain or pain radiating to her upper extremities. Patient denies any shortness of breath, difficulty breathing at night, cough or hemoptysis. Patient denies any swelling in the lower legs. Patient denies any nausea vomiting, vomiting of blood, or coffee ground material  in the vomitus. Patient denies any stomach pain. Patient states has had normal bowel movements no significant constipation or diarrhea. Patient denies any dysuria, hematuria or significant nocturia. Patient denies any problems walking, swelling in the joints or loss of balance. Patient denies any skin changes, loss of hair or loss of weight. Patient denies any excessive worrying or anxiety or significant depression. Patient denies any problems with insomnia. Patient denies excessive thirst, polyuria, polydipsia. Patient denies any swollen glands, patient denies easy bruising or easy bleeding. Patient denies any recent infections, allergies or URI. Patient "s visual fields have not changed significantly in recent time.    PHYSICAL EXAM: BP 117/81   Pulse 82   Temp (!) 97.5 F (36.4 C)   Resp 18   Wt 210 lb 10.4 oz (95.5 kg)   BMI 37.31 kg/m  Patient does have profound weakness in the right upper lobe extremity any aphasia. She status post wide local excision in the inferior outer quadrant of the left breast. Incision is healing well. No dominant mass or nodularity is noted in either breast in 2 positions examined. No axillary or supraclavicular adenopathy is appreciated. Well-developed well-nourished patient in NAD. HEENT reveals PERLA, EOMI, discs not visualized.  Oral cavity is clear. No oral mucosal lesions are identified. Neck is clear without evidence of cervical or supraclavicular adenopathy. Lungs are clear to A&P. Cardiac examination is essentially unremarkable with  regular rate and rhythm without murmur rub or thrill. Abdomen is benign with no organomegaly or masses noted. Motor sensory and DTR levels are equal and symmetric in the upper and lower extremities. Cranial nerves II through XII are grossly intact. Proprioception is intact. No peripheral adenopathy or edema is identified. No motor or sensory levels are noted. Crude visual fields are within normal range.  LABORATORY DATA: Pathology  reports reviewed    RADIOLOGY RESULTS: Mammograms and ultrasound reviewed and compatible with the above-stated findings   IMPRESSION: Stage 0 ductal carcinoma in situ in 47 year old female with history of right-sided weakness secondary to prior CVA  PLAN: At this time I to go ahead with whole breast radiation. I believe based on her rest volume we can do hypofractionated course of treatment over 4 weeks to her whole breast. I would then boost her scar another 1600 cGy using electron beam therapy. Risks and benefits of treatment including skin reaction fatigue alteration of blood counts possible inclusion of superficial lung all were discussed in detail with the patient. I will leave to medical oncology decide on antiestrogen therapy after completion of her radiation therapy. I have personally set up and ordered CT simulation next week. Patient and her sister both seem to comprehend my treatment plan well.  I would like to take this opportunity to thank you for allowing me to participate in the care of your patient.Noreene Filbert, MD

## 2017-02-17 ENCOUNTER — Institutional Professional Consult (permissible substitution): Payer: Medicare Other | Admitting: Radiation Oncology

## 2017-02-23 ENCOUNTER — Ambulatory Visit: Payer: Medicare Other

## 2017-03-14 ENCOUNTER — Ambulatory Visit
Admission: RE | Admit: 2017-03-14 | Discharge: 2017-03-14 | Disposition: A | Discharge status: Home / Self Care | Payer: Medicare Other | Source: Ambulatory Visit | Attending: Radiation Oncology | Admitting: Radiation Oncology

## 2017-03-14 DIAGNOSIS — D0512 Intraductal carcinoma in situ of left breast: Secondary | ICD-10-CM | POA: Diagnosis not present

## 2017-03-18 ENCOUNTER — Other Ambulatory Visit: Payer: Self-pay | Admitting: *Deleted

## 2017-03-18 DIAGNOSIS — D0512 Intraductal carcinoma in situ of left breast: Secondary | ICD-10-CM

## 2017-03-21 ENCOUNTER — Ambulatory Visit
Admission: RE | Admit: 2017-03-21 | Discharge: 2017-03-21 | Disposition: A | Discharge status: Home / Self Care | Payer: Medicare Other | Source: Ambulatory Visit | Attending: Radiation Oncology | Admitting: Radiation Oncology

## 2017-03-21 DIAGNOSIS — Z510 Encounter for antineoplastic radiation therapy: Secondary | ICD-10-CM | POA: Insufficient documentation

## 2017-03-21 DIAGNOSIS — D0512 Intraductal carcinoma in situ of left breast: Secondary | ICD-10-CM | POA: Diagnosis present

## 2017-03-21 DIAGNOSIS — Z170 Estrogen receptor positive status [ER+]: Secondary | ICD-10-CM | POA: Insufficient documentation

## 2017-03-22 ENCOUNTER — Ambulatory Visit
Admission: RE | Admit: 2017-03-22 | Discharge: 2017-03-22 | Disposition: A | Discharge status: Home / Self Care | Payer: Medicare Other | Source: Ambulatory Visit | Attending: Radiation Oncology | Admitting: Radiation Oncology

## 2017-03-22 DIAGNOSIS — D0512 Intraductal carcinoma in situ of left breast: Secondary | ICD-10-CM | POA: Diagnosis not present

## 2017-03-23 ENCOUNTER — Ambulatory Visit
Admission: RE | Admit: 2017-03-23 | Discharge: 2017-03-23 | Disposition: A | Discharge status: Home / Self Care | Payer: Medicare Other | Source: Ambulatory Visit | Attending: Radiation Oncology | Admitting: Radiation Oncology

## 2017-03-23 DIAGNOSIS — D0512 Intraductal carcinoma in situ of left breast: Secondary | ICD-10-CM | POA: Diagnosis not present

## 2017-03-24 ENCOUNTER — Ambulatory Visit
Admission: RE | Admit: 2017-03-24 | Discharge: 2017-03-24 | Disposition: A | Discharge status: Home / Self Care | Payer: Medicare Other | Source: Ambulatory Visit | Attending: Radiation Oncology | Admitting: Radiation Oncology

## 2017-03-24 DIAGNOSIS — D0512 Intraductal carcinoma in situ of left breast: Secondary | ICD-10-CM | POA: Diagnosis not present

## 2017-03-25 ENCOUNTER — Ambulatory Visit
Admission: RE | Admit: 2017-03-25 | Discharge: 2017-03-25 | Disposition: A | Discharge status: Home / Self Care | Payer: Medicare Other | Source: Ambulatory Visit | Attending: Radiation Oncology | Admitting: Radiation Oncology

## 2017-03-25 DIAGNOSIS — D0512 Intraductal carcinoma in situ of left breast: Secondary | ICD-10-CM | POA: Diagnosis not present

## 2017-03-28 ENCOUNTER — Ambulatory Visit
Admission: RE | Admit: 2017-03-28 | Discharge: 2017-03-28 | Disposition: A | Discharge status: Home / Self Care | Payer: Medicare Other | Source: Ambulatory Visit | Attending: Radiation Oncology | Admitting: Radiation Oncology

## 2017-03-28 DIAGNOSIS — D0512 Intraductal carcinoma in situ of left breast: Secondary | ICD-10-CM | POA: Diagnosis not present

## 2017-03-29 ENCOUNTER — Ambulatory Visit
Admission: RE | Admit: 2017-03-29 | Discharge: 2017-03-29 | Disposition: A | Discharge status: Home / Self Care | Payer: Medicare Other | Source: Ambulatory Visit | Attending: Radiation Oncology | Admitting: Radiation Oncology

## 2017-03-29 DIAGNOSIS — D0512 Intraductal carcinoma in situ of left breast: Secondary | ICD-10-CM | POA: Diagnosis not present

## 2017-03-30 ENCOUNTER — Ambulatory Visit
Admission: RE | Admit: 2017-03-30 | Discharge: 2017-03-30 | Disposition: A | Discharge status: Home / Self Care | Payer: Medicare Other | Source: Ambulatory Visit | Attending: Radiation Oncology | Admitting: Radiation Oncology

## 2017-03-30 DIAGNOSIS — D0512 Intraductal carcinoma in situ of left breast: Secondary | ICD-10-CM | POA: Diagnosis not present

## 2017-03-31 ENCOUNTER — Inpatient Hospital Stay: Payer: Medicare Other | Attending: Radiation Oncology

## 2017-03-31 ENCOUNTER — Ambulatory Visit
Admission: RE | Admit: 2017-03-31 | Discharge: 2017-03-31 | Disposition: A | Discharge status: Home / Self Care | Payer: Medicare Other | Source: Ambulatory Visit | Attending: Radiation Oncology | Admitting: Radiation Oncology

## 2017-03-31 DIAGNOSIS — D0512 Intraductal carcinoma in situ of left breast: Secondary | ICD-10-CM | POA: Diagnosis not present

## 2017-03-31 LAB — CBC
HCT: 40.2 % (ref 35.0–47.0)
Hemoglobin: 13.6 g/dL (ref 12.0–16.0)
MCH: 29.9 pg (ref 26.0–34.0)
MCHC: 33.7 g/dL (ref 32.0–36.0)
MCV: 88.7 fL (ref 80.0–100.0)
PLATELETS: 265 10*3/uL (ref 150–440)
RBC: 4.54 MIL/uL (ref 3.80–5.20)
RDW: 13.7 % (ref 11.5–14.5)
WBC: 7.8 10*3/uL (ref 3.6–11.0)

## 2017-04-01 ENCOUNTER — Ambulatory Visit
Admission: RE | Admit: 2017-04-01 | Discharge: 2017-04-01 | Disposition: A | Discharge status: Home / Self Care | Payer: Medicare Other | Source: Ambulatory Visit | Attending: Radiation Oncology | Admitting: Radiation Oncology

## 2017-04-01 DIAGNOSIS — D0512 Intraductal carcinoma in situ of left breast: Secondary | ICD-10-CM | POA: Diagnosis not present

## 2017-04-04 ENCOUNTER — Ambulatory Visit
Admission: RE | Admit: 2017-04-04 | Discharge: 2017-04-04 | Disposition: A | Discharge status: Home / Self Care | Payer: Medicare Other | Source: Ambulatory Visit | Attending: Radiation Oncology | Admitting: Radiation Oncology

## 2017-04-04 DIAGNOSIS — D0512 Intraductal carcinoma in situ of left breast: Secondary | ICD-10-CM | POA: Diagnosis not present

## 2017-04-05 ENCOUNTER — Ambulatory Visit
Admission: RE | Admit: 2017-04-05 | Discharge: 2017-04-05 | Disposition: A | Discharge status: Home / Self Care | Payer: Medicare Other | Source: Ambulatory Visit | Attending: Radiation Oncology | Admitting: Radiation Oncology

## 2017-04-05 DIAGNOSIS — D0512 Intraductal carcinoma in situ of left breast: Secondary | ICD-10-CM | POA: Diagnosis not present

## 2017-04-06 ENCOUNTER — Ambulatory Visit
Admission: RE | Admit: 2017-04-06 | Discharge: 2017-04-06 | Disposition: A | Discharge status: Home / Self Care | Payer: Medicare Other | Source: Ambulatory Visit | Attending: Radiation Oncology | Admitting: Radiation Oncology

## 2017-04-06 DIAGNOSIS — D0512 Intraductal carcinoma in situ of left breast: Secondary | ICD-10-CM | POA: Diagnosis not present

## 2017-04-07 ENCOUNTER — Ambulatory Visit
Admission: RE | Admit: 2017-04-07 | Discharge: 2017-04-07 | Disposition: A | Discharge status: Home / Self Care | Payer: Medicare Other | Source: Ambulatory Visit | Attending: Radiation Oncology | Admitting: Radiation Oncology

## 2017-04-07 DIAGNOSIS — D0512 Intraductal carcinoma in situ of left breast: Secondary | ICD-10-CM | POA: Diagnosis not present

## 2017-04-08 ENCOUNTER — Ambulatory Visit
Admission: RE | Admit: 2017-04-08 | Discharge: 2017-04-08 | Disposition: A | Discharge status: Home / Self Care | Payer: Medicare Other | Source: Ambulatory Visit | Attending: Radiation Oncology | Admitting: Radiation Oncology

## 2017-04-08 DIAGNOSIS — D0512 Intraductal carcinoma in situ of left breast: Secondary | ICD-10-CM | POA: Insufficient documentation

## 2017-04-08 DIAGNOSIS — Z170 Estrogen receptor positive status [ER+]: Secondary | ICD-10-CM | POA: Insufficient documentation

## 2017-04-08 DIAGNOSIS — Z510 Encounter for antineoplastic radiation therapy: Secondary | ICD-10-CM | POA: Insufficient documentation

## 2017-04-11 ENCOUNTER — Ambulatory Visit
Admission: RE | Admit: 2017-04-11 | Discharge: 2017-04-11 | Disposition: A | Discharge status: Home / Self Care | Payer: Medicare Other | Source: Ambulatory Visit | Attending: Radiation Oncology | Admitting: Radiation Oncology

## 2017-04-11 DIAGNOSIS — D0512 Intraductal carcinoma in situ of left breast: Secondary | ICD-10-CM | POA: Diagnosis not present

## 2017-04-12 ENCOUNTER — Ambulatory Visit
Admission: RE | Admit: 2017-04-12 | Discharge: 2017-04-12 | Disposition: A | Discharge status: Home / Self Care | Payer: Medicare Other | Source: Ambulatory Visit | Attending: Radiation Oncology | Admitting: Radiation Oncology

## 2017-04-12 DIAGNOSIS — D0512 Intraductal carcinoma in situ of left breast: Secondary | ICD-10-CM | POA: Diagnosis not present

## 2017-04-13 ENCOUNTER — Ambulatory Visit
Admission: RE | Admit: 2017-04-13 | Discharge: 2017-04-13 | Disposition: A | Discharge status: Home / Self Care | Payer: Medicare Other | Source: Ambulatory Visit | Attending: Radiation Oncology | Admitting: Radiation Oncology

## 2017-04-13 DIAGNOSIS — D0512 Intraductal carcinoma in situ of left breast: Secondary | ICD-10-CM | POA: Diagnosis not present

## 2017-04-14 ENCOUNTER — Inpatient Hospital Stay: Payer: Medicare Other | Attending: Radiation Oncology

## 2017-04-14 ENCOUNTER — Ambulatory Visit
Admission: RE | Admit: 2017-04-14 | Discharge: 2017-04-14 | Disposition: A | Discharge status: Home / Self Care | Payer: Medicare Other | Source: Ambulatory Visit | Attending: Radiation Oncology | Admitting: Radiation Oncology

## 2017-04-14 DIAGNOSIS — Z8673 Personal history of transient ischemic attack (TIA), and cerebral infarction without residual deficits: Secondary | ICD-10-CM | POA: Insufficient documentation

## 2017-04-14 DIAGNOSIS — Z7982 Long term (current) use of aspirin: Secondary | ICD-10-CM | POA: Insufficient documentation

## 2017-04-14 DIAGNOSIS — R531 Weakness: Secondary | ICD-10-CM | POA: Insufficient documentation

## 2017-04-14 DIAGNOSIS — Z803 Family history of malignant neoplasm of breast: Secondary | ICD-10-CM | POA: Insufficient documentation

## 2017-04-14 DIAGNOSIS — L598 Other specified disorders of the skin and subcutaneous tissue related to radiation: Secondary | ICD-10-CM | POA: Diagnosis not present

## 2017-04-14 DIAGNOSIS — D0512 Intraductal carcinoma in situ of left breast: Secondary | ICD-10-CM | POA: Diagnosis not present

## 2017-04-14 LAB — CBC
HCT: 41.2 % (ref 35.0–47.0)
HEMOGLOBIN: 14.1 g/dL (ref 12.0–16.0)
MCH: 29.8 pg (ref 26.0–34.0)
MCHC: 34.1 g/dL (ref 32.0–36.0)
MCV: 87.5 fL (ref 80.0–100.0)
Platelets: 272 10*3/uL (ref 150–440)
RBC: 4.71 MIL/uL (ref 3.80–5.20)
RDW: 13 % (ref 11.5–14.5)
WBC: 7.8 10*3/uL (ref 3.6–11.0)

## 2017-04-15 ENCOUNTER — Ambulatory Visit
Admission: RE | Admit: 2017-04-15 | Discharge: 2017-04-15 | Disposition: A | Discharge status: Home / Self Care | Payer: Medicare Other | Source: Ambulatory Visit | Attending: Radiation Oncology | Admitting: Radiation Oncology

## 2017-04-15 DIAGNOSIS — D0512 Intraductal carcinoma in situ of left breast: Secondary | ICD-10-CM | POA: Diagnosis not present

## 2017-04-18 ENCOUNTER — Ambulatory Visit
Admission: RE | Admit: 2017-04-18 | Discharge: 2017-04-18 | Disposition: A | Discharge status: Home / Self Care | Payer: Medicare Other | Source: Ambulatory Visit | Attending: Radiation Oncology | Admitting: Radiation Oncology

## 2017-04-18 DIAGNOSIS — D0512 Intraductal carcinoma in situ of left breast: Secondary | ICD-10-CM | POA: Diagnosis not present

## 2017-04-19 ENCOUNTER — Ambulatory Visit
Admission: RE | Admit: 2017-04-19 | Discharge: 2017-04-19 | Disposition: A | Discharge status: Home / Self Care | Payer: Medicare Other | Source: Ambulatory Visit | Attending: Radiation Oncology | Admitting: Radiation Oncology

## 2017-04-19 ENCOUNTER — Ambulatory Visit: Payer: Medicare Other | Admitting: Internal Medicine

## 2017-04-19 DIAGNOSIS — D0512 Intraductal carcinoma in situ of left breast: Secondary | ICD-10-CM | POA: Diagnosis not present

## 2017-04-20 ENCOUNTER — Ambulatory Visit
Admission: RE | Admit: 2017-04-20 | Discharge: 2017-04-20 | Disposition: A | Discharge status: Home / Self Care | Payer: Medicare Other | Source: Ambulatory Visit | Attending: Radiation Oncology | Admitting: Radiation Oncology

## 2017-04-20 DIAGNOSIS — D0512 Intraductal carcinoma in situ of left breast: Secondary | ICD-10-CM | POA: Diagnosis not present

## 2017-04-21 ENCOUNTER — Ambulatory Visit
Admission: RE | Admit: 2017-04-21 | Discharge: 2017-04-21 | Disposition: A | Discharge status: Home / Self Care | Payer: Medicare Other | Source: Ambulatory Visit | Attending: Radiation Oncology | Admitting: Radiation Oncology

## 2017-04-21 DIAGNOSIS — D0512 Intraductal carcinoma in situ of left breast: Secondary | ICD-10-CM | POA: Diagnosis not present

## 2017-04-22 ENCOUNTER — Ambulatory Visit
Admission: RE | Admit: 2017-04-22 | Discharge: 2017-04-22 | Disposition: A | Discharge status: Home / Self Care | Payer: Medicare Other | Source: Ambulatory Visit | Attending: Radiation Oncology | Admitting: Radiation Oncology

## 2017-04-22 DIAGNOSIS — D0512 Intraductal carcinoma in situ of left breast: Secondary | ICD-10-CM | POA: Diagnosis not present

## 2017-04-26 ENCOUNTER — Inpatient Hospital Stay (HOSPITAL_BASED_OUTPATIENT_CLINIC_OR_DEPARTMENT_OTHER): Payer: Medicare Other | Admitting: Internal Medicine

## 2017-04-26 ENCOUNTER — Encounter: Payer: Self-pay | Admitting: Internal Medicine

## 2017-04-26 VITALS — BP 130/74 | HR 74 | Temp 97.9°F | Resp 16 | Wt 201.0 lb

## 2017-04-26 DIAGNOSIS — L598 Other specified disorders of the skin and subcutaneous tissue related to radiation: Secondary | ICD-10-CM

## 2017-04-26 DIAGNOSIS — D0512 Intraductal carcinoma in situ of left breast: Secondary | ICD-10-CM | POA: Diagnosis not present

## 2017-04-26 DIAGNOSIS — Z8673 Personal history of transient ischemic attack (TIA), and cerebral infarction without residual deficits: Secondary | ICD-10-CM

## 2017-04-26 DIAGNOSIS — R531 Weakness: Secondary | ICD-10-CM | POA: Diagnosis not present

## 2017-04-26 DIAGNOSIS — Z7982 Long term (current) use of aspirin: Secondary | ICD-10-CM

## 2017-04-26 DIAGNOSIS — Z803 Family history of malignant neoplasm of breast: Secondary | ICD-10-CM

## 2017-04-26 NOTE — Progress Notes (Signed)
[place] CONSULT NOTE  Patient Care Team: Tracie Harrier, MD as PCP - General (Internal Medicine)  CHIEF COMPLAINTS/PURPOSE OF CONSULTATION: DCIS.  #  Oncology History   #NOV 2018 LEFT BREAST DCIS [s/p Lumpec; Dr.Smith]; 45mm;  G-3; margins- NEG; closest- posterior margin-83mm;STAGE- pTis pNx ; ER/PR >90% s/p RT [Dr.Crystal;march 15th 2019 ];   # NO primary prophylaxis/Tamoxifen sec to stroke  # hx of CVA [2012; [facility]-right sided deficits/ mild aphasia; [facility]].   # She has multiple family members diagnosed with breast cancer-including sister [as per report sister is negative for genetic mutation]- referred to [facility].        Ductal carcinoma in situ (DCIS) of left breast     HISTORY OF PRESENTING ILLNESS:  Melissa Day 47 y.o.  female left-sided DCIS is here for follow-up.  Patient finished radiation approximately 4 days ago.  Patient was to have redness and peeling of the skin under the breast.  It feels hot/tender to touch.  Patient has not been using Aqaphor ointments as recommended.  Patient also been taking hot showers.   Patient continues to have chronic mild weakness of the right upper and lower extremity; aphasia.  She continues to be on aspirin.   ROS: A complete 10 point review of system is done which is negative except mentioned above in history of present illness  MEDICAL HISTORY:  Past Medical History:  Diagnosis Date  . Anemia   . Family history of colonic polyps   . H/O: CVA (cerebrovascular accident) 2012   a. cerebral edema, craniotomy, residual right upper & lower limb weakness  . Heart murmur   . High cholesterol   . History of colon polyps   . History of echocardiogram    a. 12/01/2013: EF 50-55%, nl global LV systolic function, mild MR, mildly increased LV posterior wall thickness  . History of stress test    a. 12/01/2013: no significant ischemia, no significant WMA, no EKG changes concerning for ischemia, EF 67%, low risk scan  . Hx  of seizure disorder    a. one episode 10 months after CVA, since then on Keppra   . Hypothyroidism   . Kidney stone   . Seizures ([place])   . Stroke ([place])   . Thyroid disease     SURGICAL HISTORY: Past Surgical History:  Procedure Laterality Date  . BRAIN SURGERY    . BREAST BIOPSY Left 12/13/2016   path pending  . BREAST LUMPECTOMY WITH NEEDLE LOCALIZATION Left 01/06/2017   Procedure: BREAST LUMPECTOMY WITH NEEDLE LOCALIZATION;  Surgeon: Leonie Green, MD;  Location: [facility] ORS;  Service: General;  Laterality: Left;  . COLONOSCOPY WITH PROPOFOL N/A 11/18/2014   Procedure: COLONOSCOPY WITH PROPOFOL;  Surgeon: Josefine Class, MD;  Location: [facility] ENDOSCOPY;  Service: Endoscopy;  Laterality: N/A;  . CYSTOSCOPY/RETROGRADE/URETEROSCOPY/STONE EXTRACTION WITH BASKET Right 10/29/2014   Procedure: CYSTOSCOPY/URETEROSCOPY/STONE EXTRACTION WITH BASKET;  Surgeon: Franchot Gallo, MD;  Location: [facility] ORS;  Service: Urology;  Laterality: Right;  . ESOPHAGOGASTRODUODENOSCOPY (EGD) WITH PROPOFOL N/A 11/18/2014   Procedure: ESOPHAGOGASTRODUODENOSCOPY (EGD) WITH PROPOFOL;  Surgeon: Josefine Class, MD;  Location: [facility] ENDOSCOPY;  Service: Endoscopy;  Laterality: N/A;  . RE-EXCISION OF BREAST LUMPECTOMY Left 01/25/2017   Procedure: RE-EXCISION OF BREAST LUMPECTOMY;  Surgeon: Leonie Green, MD;  Location: [facility] ORS;  Service: General;  Laterality: Left;  . TONSILLECTOMY      SOCIAL HISTORY: Social History   Socioeconomic History  . Marital status: Divorced    Spouse name:  Not on file  . Number of children: Not on file  . Years of education: Not on file  . Highest education level: Not on file  Social Needs  . Financial resource strain: Not on file  . Food insecurity - worry: Not on file  . Food insecurity - inability: Not on file  . Transportation needs - medical: Not on file  . Transportation needs - non-medical: Not on file  Occupational History  . Not on file  Tobacco Use   . Smoking status: Former Smoker    Packs/day: 1.00    Years: 15.00    Pack years: 15.00    Types: Cigarettes    Last attempt to quit: 12/30/2011    Years since quitting: 5.3  . Smokeless tobacco: Never Used  Substance and Sexual Activity  . Alcohol use: No    Frequency: Never  . Drug use: No  . Sexual activity: No  Other Topics Concern  . Not on file  Social History Narrative  . Not on file    FAMILY HISTORY: Family History  Problem Relation Age of Onset  . Nephrolithiasis Mother   . Breast cancer Mother 48  . Heart disease Father   . CVA Father   . Nephrolithiasis Maternal Grandfather   . Diabetes Maternal Grandfather   . Breast cancer Sister 82       negative for braca (unknown variant mutation per pt's sister)  . Lymphoma Maternal Grandmother   . Breast cancer Paternal Aunt        6 paternal aunts with breast cancer  . Breast cancer Cousin        2 maternal cousins with breast cancer-negative for braca  . Colon cancer Cousin        1'st cousin with colon cancer    ALLERGIES:  has No Known Allergies.  MEDICATIONS:  Current Outpatient Medications  Medication Sig Dispense Refill  . aspirin EC 81 MG tablet Take 81 mg by mouth daily.    Marland Kitchen atorvastatin (LIPITOR) 40 MG tablet Take 1 tablet (40 mg total) by mouth daily. 30 tablet 3  . FLUoxetine (PROZAC) 40 MG capsule Take 40 mg by mouth daily.    Marland Kitchen levETIRAcetam (KEPPRA) 750 MG tablet Take 1 tablet (750 mg total) by mouth 2 (two) times daily. 28 tablet 0  . levothyroxine (SYNTHROID, LEVOTHROID) 137 MCG tablet Take 137 mcg daily before breakfast by mouth.     . metFORMIN (GLUCOPHAGE) 500 MG tablet Take 500 mg by mouth daily with breakfast.    . HYDROcodone-acetaminophen (NORCO) 5-325 MG tablet Take 1-2 tablets by mouth every 6 (six) hours as needed for moderate pain. (Patient not taking: Reported on 04/26/2017) 12 tablet 0   No current facility-administered medications for this visit.       Marland Kitchen  PHYSICAL  EXAMINATION: ECOG PERFORMANCE STATUS: 0 - Asymptomatic  Vitals:   04/26/17 1351  BP: 130/74  Pulse: 74  Resp: 16  Temp: 97.9 F (36.6 C)   Filed Weights   04/26/17 1335  Weight: 201 lb (91.2 kg)    GENERAL: Well-nourished well-developed; Alert, no distress and comfortable.   Accompanied by mother EYES: no pallor or icterus OROPHARYNX: no thrush or ulceration; good dentition  NECK: supple, no masses felt LYMPH:  no palpable lymphadenopathy in the cervical, axillary or inguinal regions LUNGS: clear to auscultation and  No wheeze or crackles HEART/CVS: regular rate & rhythm and no murmurs; No lower extremity edema ABDOMEN: abdomen soft, non-tender and normal bowel  sounds Musculoskeletal:no cyanosis of digits and no clubbing  PSYCH: alert & oriented x 3 with fluent speech NEURO: no focal motor/sensory deficits SKIN:  no rashes or significant lesions  LEFT BREAST exam [in the presence of nurse]- erythema warmth ; mild desquamation noted under the breast.    LABORATORY DATA:  I have reviewed the data as listed Lab Results  Component Value Date   WBC 7.8 04/14/2017   HGB 14.1 04/14/2017   HCT 41.2 04/14/2017   MCV 87.5 04/14/2017   PLT 272 04/14/2017   No results for input(s): NA, K, CL, CO2, GLUCOSE, BUN, CREATININE, CALCIUM, GFRNONAA, GFRAA, PROT, ALBUMIN, AST, ALT, ALKPHOS, BILITOT, BILIDIR, IBILI in the last 8760 hours.  RADIOGRAPHIC STUDIES: I have personally reviewed the radiological images as listed and agreed with the findings in the report. No results found.  ASSESSMENT & PLAN:   Ductal carcinoma in situ (DCIS) of left breast # Left breast DCIS-negative margin; margin 1 mm. Grade 3; ER PR positive.  S/p RT [finished on  3/15].  # Radiation dermatitis- recommend aquaphor/Hydrocortisone; and avoid hot showers.   Given the history of stroke I would avoid using tamoxifen.  Discussed the possible use of aromatase inhibitors; however as patient is premenopausal-she  will need to be on ovarian suppression.  Again as this is DCIS; and not invasive cancer-I think is reasonable to hold off any anti-hormonal therapy at this time.  #History of stroke/left sided CVA/mild aphasia-currently on aspirin.   # Significant history of breast cancer in the family-will be a candidate for genetic testing/genetic counseling.  Discussed with Ofri; patient was left with 3 messages; that were not returned.  We will give genetic counselors phone number to the patient/mother to contact [place].   # follow up in 4 months/ No labs.    All questions were answered. The patient knows to call the clinic with any problems, questions or concerns.    Cammie Sickle, MD 04/26/2017 2:40 PM

## 2017-04-26 NOTE — Assessment & Plan Note (Addendum)
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 Grade 3; ER PR positive.  S/p RT [finished on  3/15].  # Radiation dermatitis- recommend aquaphor/Hydrocortisone; and avoid hot showers.   Given the history of stroke I would avoid using tamoxifen.  Discussed the possible use of aromatase inhibitors; however as patient is premenopausal-she will need to be on ovarian suppression.  Again as this is DCIS; and not invasive cancer-I think is reasonable to hold off any anti-hormonal therapy at this time.  #History of stroke/left sided CVA/mild aphasia-currently on aspirin.   # Significant history of breast cancer in the family-will be a candidate for genetic testing/genetic counseling.  Discussed with Ofri; patient was left with 3 messages; that were not returned.  We will give genetic counselors phone number to the patient/mother to contact [place].   # follow up in 4 months/ No labs.

## 2017-05-09 ENCOUNTER — Encounter: Payer: Self-pay | Admitting: Genetic Counselor

## 2017-05-09 ENCOUNTER — Telehealth: Payer: Self-pay | Admitting: Genetic Counselor

## 2017-05-09 NOTE — Telephone Encounter (Signed)
Cancer Genetics            Telegenetics Initial Visit    Patient Name: Melissa Day Patient DOB: [birth year] Patient Age: 47 y.o. Phone Call Date: 05/09/2017  Referring Provider: Charlaine Dalton, MD  Reason for Visit: Evaluate for hereditary susceptibility to cancer    Assessment and Plan:  . Melissa Day's history is suggestive of a hereditary predisposition to cancer given her own breast cancer at age 89 in combination with her strong paternal family history of breast cancer in 5 of 6 paternal aunts. Reportedly, her sister had negative genetic testing, but that report was not available for review today.   . Testing is recommended to determine whether she has a pathogenic mutation that will impact her screening and risk-reduction for cancer. Given the strong family history, a negative result will not be reassuring.  . [name] wished to pursue genetic testing, but first wanted to get her insurance checked to verify any cost to her. Once that is complete, she may schedule a lab visit for a blood sample. Analysis will include the 83 genes on [facility]'s Multi-Cancer panel (ALK, APC, ATM, AXIN2, BAP1, BARD1, BLM, BMPR1A, BRCA1, BRCA2, BRIP1, CASR, CDC73, CDH1, CDK4, CDKN1B, CDKN1C, CDKN2A, CEBPA, CHEK2, CTNNA1, DICER1, DIS3L2, EGFR, EPCAM, FH, FLCN, GATA2, GPC3, GREM1, HOXB13, HRAS, KIT, MAX, MEN1, MET, MITF, MLH1, MSH2, MSH3, MSH6, MUTYH, NBN, NF1, NF2, NTHL1, PALB2, PDGFRA, PHOX2B, PMS2, POLD1, POLE, POT1, PRKAR1A, PTCH1, PTEN, RAD50, RAD51C, RAD51D, RB1, RECQL4, RET, RUNX1, SDHA, SDHAF2, SDHB, SDHC, SDHD, SMAD4, SMARCA4, SMARCB1, SMARCE1, STK11, SUFU, TERC, TERT, TMEM127, TP53, TSC1, TSC2, VHL, WRN, WT1).  . If she proceeds, results should be available in approximately 2-3 weeks from when [facility] receives her sample, at which point we will contact her and address implications for her as well as address genetic testing for at-risk family members, if needed.     Dr.  Grayland Ormond was available for questions concerning this case. Total time spent by counseling by phone was approximately 30 minutes.   _____________________________________________________________________   History of Present Illness: Melissa Day, a 47 y.o. female, was referred for genetic counseling to discuss the possibility of a hereditary predisposition to cancer and discuss whether genetic testing is warranted. This was a telegenetics visit via phone. She was joined by both of her parents as well as her sister.  Ms. Remlinger was diagnosed with breast cancer at the age of 8. She is s/p lumpectomy and radiation.    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   # She has multiple family members diagnosed with breast cancer-including sister [as per report sister is negative for genetic mutation]- [facility]       Ductal carcinoma in situ (DCIS) of left breast    Past Medical History:  Diagnosis Date  . Anemia   . Family history of colonic polyps   . H/O: CVA (cerebrovascular accident) 2012   a. cerebral edema, craniotomy, residual right upper & lower limb weakness  . Heart murmur   . High cholesterol   . History of colon polyps   . History of echocardiogram    a. 12/01/2013: EF 50-55%, nl global  LV systolic function, mild MR, mildly increased LV posterior wall thickness  . History of stress test    a. 12/01/2013: no significant ischemia, no significant WMA, no EKG changes concerning for ischemia, EF 67%, low risk scan  . Hx of seizure disorder    a. one episode 10 months after CVA, since then on Keppra   . Hypothyroidism   . Kidney stone   . Seizures ([place])   . Stroke ([place])   . Thyroid disease      Past Surgical History:  Procedure Laterality Date  . BRAIN SURGERY    . BREAST BIOPSY Left 12/13/2016   path pending  . BREAST LUMPECTOMY WITH NEEDLE LOCALIZATION Left 01/06/2017   Procedure: BREAST LUMPECTOMY WITH NEEDLE LOCALIZATION;  Surgeon: Leonie Green, MD;  Location: [facility] ORS;  Service: General;  Laterality: Left;  . COLONOSCOPY WITH PROPOFOL N/A 11/18/2014   Procedure: COLONOSCOPY WITH PROPOFOL;  Surgeon: Josefine Class, MD;  Location: [facility] ENDOSCOPY;  Service: Endoscopy;  Laterality: N/A;  . CYSTOSCOPY/RETROGRADE/URETEROSCOPY/STONE EXTRACTION WITH BASKET Right 10/29/2014   Procedure: CYSTOSCOPY/URETEROSCOPY/STONE EXTRACTION WITH BASKET;  Surgeon: Franchot Gallo, MD;  Location: [facility] ORS;  Service: Urology;  Laterality: Right;  . ESOPHAGOGASTRODUODENOSCOPY (EGD) WITH PROPOFOL N/A 11/18/2014   Procedure: ESOPHAGOGASTRODUODENOSCOPY (EGD) WITH PROPOFOL;  Surgeon: Josefine Class, MD;  [facility] ENDOSCOPY;  Service: Endoscopy;  Laterality: N/A;  . RE-EXCISION OF BREAST LUMPECTOMY Left 01/25/2017   Procedure: RE-EXCISION OF BREAST LUMPECTOMY;  Surgeon: Leonie Green, MD;  Location: [facility] ORS;  Service: General;  Laterality: Left;  . TONSILLECTOMY      Family History: Significant diagnoses include the following:  Family History  Problem Relation Age of Onset  . Nephrolithiasis Mother   . Non-Hodgkin's lymphoma Mother 61       currently 44  . Heart disease Father   . CVA Father   . Nephrolithiasis Maternal Grandfather   . Diabetes Maternal Grandfather   . Pancreatic cancer Maternal Grandfather 65       deceased 32  . Breast cancer Sister 78       negative genetic testing; currently 75  . Lymphoma Maternal Grandmother        deceased 74  . Breast cancer Paternal Aunt        4 more paternal aunts with breast cancer dx 60s-70s  . Breast cancer Cousin        2 paternal cousins; daughters of pat aunt w/ BC at 50  . Colon cancer Cousin 39        son of mat aunt  . Breast cancer Paternal Aunt 39       deceased 69 of MI    Additionally, Ms. Lal has a son (age 53). She has one sister (noted above). Her sister reportedly had an extended genetics panel that was normal (a variant of uncertain significance was found, but she did not recall which gene). Her mother had only one sister. Her father had a total of 6 sisters (5 had breast cancer) and one brother.  Ms. Foronda ancestry is Caucasian - NOS. There is no known Jewish ancestry and no consanguinity.  Discussion: We reviewed the characteristics, features and inheritance patterns of hereditary cancer syndromes. We discussed her risk of harboring a mutation in the context of her personal and family history. We discussed the process of genetic testing, insurance coverage and implications of results: positive, negative and variant of unknown significance (VUS).   Ms. Liz questions were answered to her satisfaction today and  she is welcome to call with any additional questions or concerns. Thank you for the referral and allowing [place] to share in the care of your patient.    Steele Berg, MS, [place] Certified Genetic Counselor phone: [phone]

## 2017-05-10 ENCOUNTER — Other Ambulatory Visit: Payer: Self-pay | Admitting: Genetic Counselor

## 2017-05-10 DIAGNOSIS — D0512 Intraductal carcinoma in situ of left breast: Secondary | ICD-10-CM

## 2017-06-02 ENCOUNTER — Ambulatory Visit: Payer: Medicare Other | Admitting: Radiation Oncology

## 2017-06-03 ENCOUNTER — Ambulatory Visit
Admission: RE | Admit: 2017-06-03 | Discharge: 2017-06-03 | Disposition: A | Discharge status: Home / Self Care | Payer: Medicare Other | Source: Ambulatory Visit | Attending: Radiation Oncology | Admitting: Radiation Oncology

## 2017-06-03 ENCOUNTER — Inpatient Hospital Stay: Payer: Medicare Other | Attending: Internal Medicine

## 2017-06-03 ENCOUNTER — Other Ambulatory Visit: Payer: Self-pay

## 2017-06-03 VITALS — BP 117/80 | HR 71 | Temp 97.8°F | Wt 205.1 lb

## 2017-06-03 DIAGNOSIS — Z8673 Personal history of transient ischemic attack (TIA), and cerebral infarction without residual deficits: Secondary | ICD-10-CM | POA: Diagnosis not present

## 2017-06-03 DIAGNOSIS — Z923 Personal history of irradiation: Secondary | ICD-10-CM | POA: Diagnosis not present

## 2017-06-03 DIAGNOSIS — Z86000 Personal history of in-situ neoplasm of breast: Secondary | ICD-10-CM | POA: Insufficient documentation

## 2017-06-03 DIAGNOSIS — D0512 Intraductal carcinoma in situ of left breast: Secondary | ICD-10-CM

## 2017-06-03 NOTE — Progress Notes (Signed)
Radiation Oncology Follow up Note  Name: Melissa Day   Date:   06/03/2017 MRN:  [id number] DOB: January 09, 1971    This 47 y.o. female presents to the clinic today for one-month follow-up status post whole breast radiation to her left breast for ductal carcinoma in situ.  REFERRING PROVIDER: Tracie Harrier, MD  HPI: Melissa Day is a 47 year old female now seen out 1 month having completed whole breast radiation to her left breast for ductal carcinoma in situ.she seen today in routine follow-up is doing well she states she's feeling some lumps in her breast. She is not on antiestrogen therapy based on her history of CVA. She specifically denies breast tenderness cough or bone pain.  COMPLICATIONS OF TREATMENT: none  FOLLOW UP COMPLIANCE: keeps appointments   PHYSICAL EXAM:  BP 117/80   Pulse 71   Temp 97.8 F (36.6 C)   Wt 205 lb 2.2 oz (93.1 kg)   BMI 36.34 kg/m  Lungs are clear to A&P cardiac examination essentially unremarkable with regular rate and rhythm. No dominant mass or nodularity is noted in either breast in 2 positions examined. Incision is well-healed. No axillary or supraclavicular adenopathy is appreciated. Cosmetic result is excellent. Well-developed well-nourished patient in NAD. HEENT reveals PERLA, EOMI, discs not visualized.  Oral cavity is clear. No oral mucosal lesions are identified. Neck is clear without evidence of cervical or supraclavicular adenopathy. Lungs are clear to A&P. Cardiac examination is essentially unremarkable with regular rate and rhythm without murmur rub or thrill. Abdomen is benign with no organomegaly or masses noted. Motor sensory and DTR levels are equal and symmetric in the upper and lower extremities. Cranial nerves II through XII are grossly intact. Proprioception is intact. No peripheral adenopathy or edema is identified. No motor or sensory levels are noted. Crude visual fields are within normal range.  RADIOLOGY RESULTS: no current films  for review  PLAN: resent time patient is doing well with no evidence of disease 1 month out. I've told her to stop doing her own self-examinations since she does have fibrocystic disease making those exams difficult. We will order mammograms on her in about 6 months. I've asked to see her back in 4-5 months for follow-up. Patient knows to call with any concerns.  I would like to take this opportunity to thank you for allowing me to participate in the care of your patient.Noreene Filbert, MD

## 2017-06-10 ENCOUNTER — Telehealth: Payer: Self-pay | Admitting: Genetic Counselor

## 2017-06-10 NOTE — Telephone Encounter (Signed)
Cancer Genetics             Telegenetics Results Disclosure   Patient Name: Melissa Day Patient DOB: March 05, 1970 Patient Age: 47 y.o. Phone Call Date: 06/10/2017  Referring Provider: Charlaine Dalton, MD    Melissa Day was called today to discuss genetic test results. Please see the Genetics telephone note from 05/09/2017 for a detailed discussion of her personal and family histories and the recommendations provided.  Genetic Testing: At the time of Melissa Day's telegenetics visit, she decided to pursue genetic testing of multiple genes associated with hereditary susceptibility to cancer. Testing included sequencing and deletion/duplication analysis. Testing did not reveal a pathogenic mutation in any of the genes analyzed.  A copy of the genetic test report will be scanned into Epic under the Media tab.  The genes analyzed were the 83 genes on [facility]'s Multi-Cancer panel (ALK, APC, ATM, AXIN2, BAP1, BARD1, BLM, BMPR1A, BRCA1, BRCA2, BRIP1, CASR, CDC73, CDH1, CDK4, CDKN1B, CDKN1C, CDKN2A, CEBPA, CHEK2, CTNNA1, DICER1, DIS3L2, EGFR, EPCAM, FH, FLCN, GATA2, GPC3, GREM1, HOXB13, HRAS, KIT, MAX, MEN1, MET, MITF, MLH1, MSH2, MSH3, MSH6, MUTYH, NBN, NF1, NF2, NTHL1, PALB2, PDGFRA, PHOX2B, PMS2, POLD1, POLE, POT1, PRKAR1A, PTCH1, PTEN, RAD50, RAD51C, RAD51D, RB1, RECQL4, RET, RUNX1, SDHA, SDHAF2, SDHB, SDHC, SDHD, SMAD4, SMARCA4, SMARCB1, SMARCE1, STK11, SUFU, TERC, TERT, TMEM127, TP53, TSC1, TSC2, VHL, WRN, WT1).  Since the current test is not perfect, it is possible that there may be a gene mutation that current testing cannot detect, but that chance is small. It is possible that a different genetic factor, which has not yet been discovered or is not on this panel, is responsible for the cancer diagnoses in the family. No additional testing is recommended at this time for Melissa Day.  Two Variants of Uncertain Significance were detected: BLM c.2638G>C (p.Glu880Gln) and  SMARCA4 c.4211T>G (p.Val1404Gly). This is still considered a normal result. While at this time, it is unknown if this finding is associated with increased cancer risk, the majority of these variants get reclassified to be inconsequential. Medical management should not be based on this finding. With time, we suspect the lab will determine the significance, if any. If we do learn more about it, we will try to contact Melissa Day to discuss it further. It is important to stay in touch with [place] periodically and keep the address and phone number up to date.  Cancer Screening: Given the personal and family histories, results need to be interpreted with some caution. Families with features suggestive of hereditary risk tend to have multiple family members with cancer, diagnoses in multiple generations and diagnoses before the age of 23. Melissa Day's family exhibits some of these features. Cancer screenings will be deferred to her providers  Family Members: Given the young ages of breast cancer in the family, women are recommended to speak with their own providers about having initiating breast screenings at a younger age than is typically recommended in the general population.  Any relative who had cancer at a young age or had a particularly rare cancer may also wish to pursue genetic testing. Genetic counselors can be located in other cities, by visiting the website of the [employer] ([employer]) and Field seismologist for a Dietitian by zip code.   Family members are not recommended to get tested for the above VUSs outside of a research protocol as  this finding has no implications for their medical management.    Lastly, cancer genetics is a rapidly advancing field and it is possible that new genetic tests will be appropriate for Melissa Day in the future. We encourage Melissa Day to remain in contact with Genetics on an annual basis so her personal and family histories can be updated.      Steele Berg, MS, [place] Certified Genetic Counselor phone: [phone]

## 2017-07-08 DIAGNOSIS — R739 Hyperglycemia, unspecified: Secondary | ICD-10-CM | POA: Insufficient documentation

## 2017-07-29 ENCOUNTER — Emergency Department: Payer: Medicare Other

## 2017-07-29 ENCOUNTER — Encounter: Payer: Self-pay | Admitting: Emergency Medicine

## 2017-07-29 ENCOUNTER — Other Ambulatory Visit: Payer: Self-pay

## 2017-07-29 ENCOUNTER — Emergency Department
Admission: EM | Admit: 2017-07-29 | Discharge: 2017-07-29 | Disposition: A | Discharge status: Home / Self Care | Payer: Medicare Other | Attending: Emergency Medicine | Admitting: Emergency Medicine

## 2017-07-29 DIAGNOSIS — Z8673 Personal history of transient ischemic attack (TIA), and cerebral infarction without residual deficits: Secondary | ICD-10-CM | POA: Diagnosis not present

## 2017-07-29 DIAGNOSIS — R51 Headache: Secondary | ICD-10-CM | POA: Diagnosis not present

## 2017-07-29 DIAGNOSIS — R4781 Slurred speech: Secondary | ICD-10-CM

## 2017-07-29 DIAGNOSIS — Z7984 Long term (current) use of oral hypoglycemic drugs: Secondary | ICD-10-CM | POA: Insufficient documentation

## 2017-07-29 DIAGNOSIS — Z87891 Personal history of nicotine dependence: Secondary | ICD-10-CM | POA: Diagnosis not present

## 2017-07-29 DIAGNOSIS — E039 Hypothyroidism, unspecified: Secondary | ICD-10-CM | POA: Diagnosis not present

## 2017-07-29 DIAGNOSIS — R531 Weakness: Secondary | ICD-10-CM

## 2017-07-29 DIAGNOSIS — Z79899 Other long term (current) drug therapy: Secondary | ICD-10-CM | POA: Diagnosis not present

## 2017-07-29 DIAGNOSIS — Z7982 Long term (current) use of aspirin: Secondary | ICD-10-CM | POA: Diagnosis not present

## 2017-07-29 LAB — CBC
HEMATOCRIT: 43.1 % (ref 35.0–47.0)
HEMOGLOBIN: 14.7 g/dL (ref 12.0–16.0)
MCH: 30.3 pg (ref 26.0–34.0)
MCHC: 34.2 g/dL (ref 32.0–36.0)
MCV: 88.5 fL (ref 80.0–100.0)
Platelets: 286 10*3/uL (ref 150–440)
RBC: 4.87 MIL/uL (ref 3.80–5.20)
RDW: 13.9 % (ref 11.5–14.5)
WBC: 10.4 10*3/uL (ref 3.6–11.0)

## 2017-07-29 LAB — URINALYSIS, COMPLETE (UACMP) WITH MICROSCOPIC
Bacteria, UA: NONE SEEN
Bilirubin Urine: NEGATIVE
GLUCOSE, UA: NEGATIVE mg/dL
HGB URINE DIPSTICK: NEGATIVE
KETONES UR: NEGATIVE mg/dL
LEUKOCYTES UA: NEGATIVE
Nitrite: NEGATIVE
PH: 5 (ref 5.0–8.0)
Protein, ur: NEGATIVE mg/dL
Specific Gravity, Urine: 1.018 (ref 1.005–1.030)

## 2017-07-29 LAB — DIFFERENTIAL
BASOS PCT: 1 %
Basophils Absolute: 0.1 10*3/uL (ref 0–0.1)
EOS ABS: 0.1 10*3/uL (ref 0–0.7)
EOS PCT: 1 %
LYMPHS ABS: 2.9 10*3/uL (ref 1.0–3.6)
Lymphocytes Relative: 28 %
MONO ABS: 0.6 10*3/uL (ref 0.2–0.9)
MONOS PCT: 6 %
Neutro Abs: 6.7 10*3/uL — ABNORMAL HIGH (ref 1.4–6.5)
Neutrophils Relative %: 64 %

## 2017-07-29 LAB — COMPREHENSIVE METABOLIC PANEL
ALT: 22 U/L (ref 14–54)
AST: 17 U/L (ref 15–41)
Albumin: 4.2 g/dL (ref 3.5–5.0)
Alkaline Phosphatase: 84 U/L (ref 38–126)
Anion gap: 10 (ref 5–15)
BILIRUBIN TOTAL: 0.6 mg/dL (ref 0.3–1.2)
BUN: 13 mg/dL (ref 6–20)
CHLORIDE: 104 mmol/L (ref 101–111)
CO2: 24 mmol/L (ref 22–32)
Calcium: 8.9 mg/dL (ref 8.9–10.3)
Creatinine, Ser: 0.7 mg/dL (ref 0.44–1.00)
GFR calc non Af Amer: >60 mL/min (ref >60)
Glucose, Bld: 103 mg/dL — ABNORMAL HIGH (ref 65–99)
POTASSIUM: 3.6 mmol/L (ref 3.5–5.1)
Sodium: 138 mmol/L (ref 135–145)
TOTAL PROTEIN: 7.3 g/dL (ref 6.5–8.1)

## 2017-07-29 LAB — APTT: aPTT: 29 seconds (ref 24–36)

## 2017-07-29 LAB — TROPONIN I

## 2017-07-29 LAB — PROTIME-INR
INR: 0.89
Prothrombin Time: 12 seconds (ref 11.4–15.2)

## 2017-07-29 LAB — POCT PREGNANCY, URINE: Preg Test, Ur: NEGATIVE

## 2017-07-29 LAB — TSH: TSH: 2.919 u[IU]/mL (ref 0.350–4.500)

## 2017-07-29 MED ORDER — ASPIRIN 81 MG PO CHEW
324.0000 mg | CHEWABLE_TABLET | Freq: Once | ORAL | Status: AC
  Administered 2017-07-29: 324 mg via ORAL
  Filled 2017-07-29: qty 4

## 2017-07-29 MED ORDER — FENTANYL CITRATE (PF) 100 MCG/2ML IJ SOLN
50.0000 ug | Freq: Once | INTRAMUSCULAR | Status: AC
  Administered 2017-07-29: 50 ug via INTRAVENOUS
  Filled 2017-07-29: qty 2

## 2017-07-29 MED ORDER — SODIUM CHLORIDE 0.9 % IV BOLUS
500.0000 mL | Freq: Once | INTRAVENOUS | Status: AC
  Administered 2017-07-29: 500 mL via INTRAVENOUS

## 2017-07-29 NOTE — ED Notes (Signed)
Pt family answering MRI questions at this time.

## 2017-07-29 NOTE — ED Provider Notes (Signed)
[facility] Emergency Department Provider Note  ____________________________________________  Time seen: Approximately 7:53 AM  I have reviewed the triage vital signs and the nursing notes.   HISTORY  Chief Complaint Weakness and Aphasia   HPI Melissa Day is a 47 y.o. 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  Since yesterday patient is reporting sharp constant moderate pain on her right side including right upper and lower extremities.  Yesterday she had a mild headache associated with it.  The headache has resolved.  She also feels that her right side is weaker than baseline.  Her speech is slightly more slurred according to family members than baseline.  All the symptoms started yesterday.  Patient denies chest pain or shortness of breath, dizziness, dysphasia, changes in vision, fever chills, dysuria, abdominal pain, nausea or vomiting.  Past Medical History:  Diagnosis Date  . Anemia   . Family history of colonic polyps   . H/O: CVA (cerebrovascular accident) 2012   a. cerebral edema, craniotomy, residual right upper & lower limb weakness  . Heart murmur   . High cholesterol   . History of colon polyps   . History of echocardiogram    a. 12/01/2013: EF 50-55%, nl global LV systolic function, mild MR, mildly increased LV posterior wall thickness  . History of stress test    a. 12/01/2013: no significant ischemia, no significant WMA, no EKG changes concerning for ischemia, EF 67%, low risk scan  . Hx of seizure disorder    a. one episode 10 months after CVA, since then on Keppra   . Hypothyroidism   . Kidney stone   . Seizures ([place])   . Stroke ([place])   . Thyroid disease     Patient Active Problem List   Diagnosis Date Noted  . 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 Ureteral stone 10/29/2014  . Chest pain 08/21/2014  . Malaise and fatigue 08/21/2014  . Morbid obesity ([place]) 08/21/2014  . H/O: CVA (cerebrovascular accident)   . Hx of seizure disorder   . History of echocardiogram   . History of stress test   . Heart murmur   . Thyroid disease   . Clinical depression 12/12/2013  . Vitamin D deficiency 12/12/2013  . 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 BRAIN SURGERY    . BREAST BIOPSY Left 12/13/2016   path pending  . BREAST LUMPECTOMY WITH NEEDLE LOCALIZATION Left 01/06/2017   Procedure: BREAST LUMPECTOMY WITH NEEDLE LOCALIZATION;  Surgeon: Leonie Green, MD;  Location: [facility] ORS;  Service: General;  Laterality: Left;  . COLONOSCOPY WITH PROPOFOL N/A 11/18/2014   Procedure: COLONOSCOPY WITH PROPOFOL;  Surgeon: Josefine Class, MD;  Location: [facility] ENDOSCOPY;  Service: Endoscopy;  Laterality: N/A;  . CYSTOSCOPY/RETROGRADE/URETEROSCOPY/STONE EXTRACTION WITH BASKET Right 10/29/2014   Procedure: CYSTOSCOPY/URETEROSCOPY/STONE EXTRACTION WITH BASKET;  Surgeon: Franchot Gallo, MD;  Location: [facility] ORS;  Service: Urology;  Laterality: Right;  . ESOPHAGOGASTRODUODENOSCOPY (EGD) WITH PROPOFOL N/A 11/18/2014   Procedure: ESOPHAGOGASTRODUODENOSCOPY (EGD) WITH PROPOFOL;  Surgeon: Josefine Class, MD;  Location: [facility] ENDOSCOPY;  Service: Endoscopy;  Laterality: N/A;  . RE-EXCISION OF BREAST LUMPECTOMY Left 01/25/2017   Procedure: RE-EXCISION OF BREAST LUMPECTOMY;  Surgeon: Leonie Green, MD;  Location: [facility] ORS;  Service:  General;  Laterality: Left;  . TONSILLECTOMY      Prior to Admission medications   Medication Sig Start Date End Date Taking? Authorizing Provider  aspirin EC 81 MG tablet Take 81 mg by mouth daily.   Yes [provider]  atorvastatin (LIPITOR) 40 MG tablet Take 1 tablet (40 mg total) by mouth daily. 06/21/14  Yes Dunn,  Areta Haber, PA-C  azithromycin (ZITHROMAX) 250 MG tablet Take 2 tablets (500MG ) by mouth first day then take 1 tablet (250MG ) by mouth daily next four days 07/25/17  Yes [provider]  FLUoxetine (PROZAC) 40 MG capsule Take 40 mg by mouth daily.   Yes [provider]  levETIRAcetam (KEPPRA) 750 MG tablet Take 1 tablet (750 mg total) by mouth 2 (two) times daily. 08/21/14  Yes Minna Merritts, MD  levothyroxine (SYNTHROID, LEVOTHROID) 137 MCG tablet Take 137 mcg daily before breakfast by mouth.  10/29/16  Yes [provider]  metFORMIN (GLUCOPHAGE) 500 MG tablet Take 500 mg by mouth daily with breakfast.   Yes [provider]  predniSONE (DELTASONE) 10 MG tablet Take 10 mg by mouth daily. 07/25/17 07/31/17 Yes [provider]  HYDROcodone-acetaminophen (NORCO) 5-325 MG tablet Take 1-2 tablets by mouth every 6 (six) hours as needed for moderate pain. Patient not taking: Reported on 04/26/2017 01/25/17   Leonie Green, MD    Allergies Patient has no known allergies.  Family History  Problem Relation Age of Onset  . Nephrolithiasis Mother   . Non-Hodgkin's lymphoma Mother 98       currently 75  . Heart disease Father   . CVA Father   . Nephrolithiasis Maternal Grandfather   . Diabetes Maternal Grandfather   . Pancreatic cancer Maternal Grandfather 59       deceased 34  . Breast cancer Sister 65       negative genetic testing; currently 88  . Lymphoma Maternal Grandmother        deceased 31  . Breast cancer Paternal Aunt        4 more paternal aunts with breast cancer dx 60s-70s  . Breast cancer Cousin        2 paternal cousins; daughters of pat aunt w/ BC at 37  . Colon cancer Cousin 3       son of mat aunt  . Breast cancer Paternal Aunt 63       deceased 109 of MI    Social History Social History   Tobacco Use  . Smoking status: Former Smoker    Packs/day: 1.00    Years: 15.00    Pack years: 15.00    Types: Cigarettes    Last  attempt to quit: 12/30/2011    Years since quitting: 5.5  . Smokeless tobacco: Never Used  Substance Use Topics  . Alcohol use: No    Frequency: Never  . Drug use: No    Review of Systems  Constitutional: Negative for fever. Eyes: Negative for visual changes. ENT: Negative for sore throat. Neck: No neck pain  Cardiovascular: Negative for chest pain. Respiratory: Negative for shortness of breath. Gastrointestinal: Negative for abdominal pain, vomiting or diarrhea. Genitourinary: Negative for dysuria. Musculoskeletal: Negative for back pain. Skin: Negative for rash. Neurological: + headaches and worsening R sided weakness Psych: No SI or HI  ____________________________________________   PHYSICAL EXAM:  VITAL SIGNS: ED Triage Vitals  Enc Vitals Group     BP 07/29/17 0714 (!) 151/92     Pulse Rate  07/29/17 0714 77     Resp 07/29/17 0714 16     Temp 07/29/17 0714 98.1 F (36.7 C)     Temp Source 07/29/17 0714 Oral     SpO2 07/29/17 0714 98 %     Weight 07/29/17 0715 198 lb (89.8 kg)     Height 07/29/17 0715 5\' 3"  (1.6 m)     Head Circumference --      Peak Flow --      Pain Score --      Pain Loc --      Pain Edu? --      Excl. in [place]? --     Constitutional: Alert and oriented. Well appearing and in no apparent distress. HEENT:      Head: Normocephalic and atraumatic.         Eyes: Conjunctivae are normal. Sclera is non-icteric.       Mouth/Throat: Mucous membranes are moist.       Neck: Supple with no signs of meningismus. Cardiovascular: Regular rate and rhythm. No murmurs, gallops, or rubs. 2+ symmetrical distal pulses are present in all extremities. No JVD. Respiratory: Normal respiratory effort. Lungs are clear to auscultation bilaterally. No wheezes, crackles, or rhonchi.  Gastrointestinal: Soft, non tender, and non distended with positive bowel sounds. No rebound or guarding. Musculoskeletal: Nontender with normal range of motion in all extremities. No  edema, cyanosis, or erythema of extremities. Neurologic: Expressive aphasia, no slurring noted. Slight R sided nasolabial fold asymmetry, 4+/5 on the R with muscle atrophy and 5/5 on the L, no pronator drift Skin: Skin is warm, dry and intact. No rash noted. Psychiatric: Mood and affect are normal. Speech and behavior are normal.  ____________________________________________   LABS (all labs ordered are listed, but only abnormal results are displayed)  Labs Reviewed  DIFFERENTIAL - Abnormal; Notable for the following components:      Result Value   Neutro Abs 6.7 (*)    All other components within normal limits  COMPREHENSIVE METABOLIC PANEL - Abnormal; Notable for the following components:   Glucose, Bld 103 (*)    All other components within normal limits  URINALYSIS, COMPLETE (UACMP) WITH MICROSCOPIC - Abnormal; Notable for the following components:   Color, Urine YELLOW (*)    APPearance CLEAR (*)    All other components within normal limits  PROTIME-INR  APTT  CBC  TROPONIN I  TSH  CBG MONITORING, ED  POC URINE PREG, ED  POCT PREGNANCY, URINE   ____________________________________________  EKG  ED ECG REPORT I, Rudene Re, the attending physician, personally viewed and interpreted this ECG.  Normal sinus rhythm, rate of 70, normal intervals, normal axis, no ST elevations or depressions, T wave inversion lead III.  EKG unchanged from prior from 2008 ____________________________________________  RADIOLOGY  I have personally reviewed the images performed during this visit and I agree with the Radiologist's read.   Interpretation by Radiologist:  Ct Head Wo Contrast  Result Date: 07/29/2017 CLINICAL DATA:  LEFT-sided weakness that began yesterday. Prior stroke. EXAM: CT HEAD WITHOUT CONTRAST TECHNIQUE: Contiguous axial images were obtained from the base of the skull through the vertex without intravenous contrast. COMPARISON:  None. FINDINGS: Brain: No acute  stroke, hemorrhage, mass lesion, hydrocephalus, or extra-axial fluid. Generalized atrophy. Old LEFT hemisphere stroke with encephalomalacia. Small chronic RIGHT parietal insult, stable. Vascular: No hyperdense vessel. Skull: LEFT temporal craniectomy and LEFT frontotemporal craniotomy, unchanged. Sinuses/Orbits: No acute findings. Other: None. IMPRESSION: No acute findings.  Old LEFT MCA  stroke as described. Electronically Signed   By: Staci Righter M.D.   On: 07/29/2017 07:50   Mr Brain Wo Contrast  Result Date: 07/29/2017 CLINICAL DATA:  Focal neuro deficit. Increasing aphasia. History of stroke. EXAM: MRI HEAD WITHOUT CONTRAST TECHNIQUE: Multiplanar, multiecho pulse sequences of the brain and surrounding structures were obtained without intravenous contrast. COMPARISON:  CT head 07/29/2017.  MRI 08/11/2014 FINDINGS: Brain: Negative for acute infarct. Large chronic infarct left anterior MCA territory unchanged. Small chronic infarcts in the parietal lobe bilaterally. Negative for hemorrhage, mass or fluid collection. No midline shift. Negative for hydrocephalus. Mild enlargement of left lateral ventricle due to volume loss from chronic infarction on the left Vascular: Normal arterial flow voids Skull and upper cervical spine: Left frontal craniotomy. Sinuses/Orbits: Negative Other: None IMPRESSION: Negative for acute infarct. Chronic left MCA infarct unchanged. Small chronic parietal infarcts bilaterally also unchanged. Electronically Signed   By: Franchot Gallo M.D.   On: 07/29/2017 10:43     ____________________________________________   PROCEDURES  Procedure(s) performed: None Procedures Critical Care performed:  None ____________________________________________   INITIAL IMPRESSION / ASSESSMENT AND PLAN / ED COURSE   47 y.o. female with a history of large L MCA CVA in 2012 and residual right-sided weakness and expressive aphasia, hyperlipidemia, seizure disorder on Keppra, hypothyroidism  who presents for evaluation of right-sided pain and worsening neuro deficits.  Per family patient is at baseline other than may be mild slurred speech.  She does have obvious deficits on the right side however that seems to be old.  Differential diagnosis including new stroke versus reactivation of old stroke symptoms in the setting of infection, dehydration, electrolyte abnormalities.  Head CT with no evidence of new stroke.  Will check labs and urinalysis.  EKG with no evidence of dysrhythmias or ischemia.  Plan for MRI to rule out acute stroke. Will give full dose ASA.    _________________________ 12:58 PM on 07/29/2017 -----------------------------------------  Work-up essentially unremarkable with unchanged MRI, no acute stroke, normal labs and urinalysis.  Plan for follow-up with neurologist.  Discussed return precautions with patient and her sister.  At this time patient will be discharged home.   As part of my medical decision making, I reviewed the following data within the [place] History obtained from family, Nursing notes reviewed and incorporated, Labs reviewed , EKG interpreted , Old EKG reviewed, Old chart reviewed, Radiograph reviewed , Notes from prior ED visits and [place] Controlled Substance Database    Pertinent labs & imaging results that were available during my care of the patient were reviewed by me and considered in my medical decision making (see chart for details).    ____________________________________________   FINAL CLINICAL IMPRESSION(S) / ED DIAGNOSES  Final diagnoses:  Right sided weakness  Slurred speech      NEW MEDICATIONS STARTED DURING THIS VISIT:  ED Discharge Orders    None       Note:  This document was prepared using Dragon voice recognition software and may include unintentional dictation errors.    Alfred Levins, [place], MD 07/29/17 1259

## 2017-07-29 NOTE — Discharge Instructions (Signed)
Your work-up did not show any acute findings.  Follow-up with your neurologist or primary care doctor in 2 days for reevaluation.  Return to the emergency room for new or worsening facial droop, unilateral weakness or numbness, slurred speech, chest pain, shortness of breath.

## 2017-07-29 NOTE — ED Triage Notes (Signed)
Patient presents to the ED with increased weakness to her right side and increased aphasia since yesterday.  Patient has history of a stroke that affected her right side.  Patient has no visible arm drift.  Smile is slightly uneven.  Patient is unable to make a fist with her right hand.  Family states this is baseline.

## 2017-07-29 NOTE — ED Notes (Signed)
Pt and pt family aware of need of urine sample. RN will monitor.

## 2017-07-29 NOTE — ED Notes (Signed)
Pt at CT at this time.

## 2017-08-23 ENCOUNTER — Inpatient Hospital Stay: Payer: Medicare Other | Attending: Internal Medicine | Admitting: Internal Medicine

## 2017-08-23 ENCOUNTER — Other Ambulatory Visit: Payer: Self-pay

## 2017-08-23 ENCOUNTER — Encounter: Payer: Self-pay | Admitting: Internal Medicine

## 2017-08-23 VITALS — BP 118/81 | Temp 97.0°F | Resp 18 | Ht 63.0 in | Wt 201.1 lb

## 2017-08-23 DIAGNOSIS — D0512 Intraductal carcinoma in situ of left breast: Secondary | ICD-10-CM | POA: Diagnosis present

## 2017-08-23 DIAGNOSIS — Z7982 Long term (current) use of aspirin: Secondary | ICD-10-CM | POA: Diagnosis not present

## 2017-08-23 DIAGNOSIS — Z87891 Personal history of nicotine dependence: Secondary | ICD-10-CM

## 2017-08-23 DIAGNOSIS — Z8673 Personal history of transient ischemic attack (TIA), and cerebral infarction without residual deficits: Secondary | ICD-10-CM | POA: Diagnosis not present

## 2017-08-23 NOTE — Assessment & Plan Note (Addendum)
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 Grade 3; ER PR positive.  S/p RT [finished on  3/15].  #Stable; no clinical evidence of any recurrence.  Given history of stroke patient is on tamoxifen.  Recommend mammogram in November 2019.  #History of stroke/left sided CVA/mild aphasia-currently on aspirin.  Stable.  # Significant history of breast cancer in the family-genetic testing Negative.  Reviewed the findings with the patient and family.  # follow up in 6 months/ No labs.; Mammogram nov 2019.

## 2017-08-23 NOTE — Progress Notes (Signed)
[place] OFFICE PROGRESS NOTE  Patient Care Team: Tracie Harrier, MD as PCP - General (Internal Medicine)  Cancer Staging No matching staging information was found for the patient.   Oncology History   #NOV 2018 LEFT BREAST DCIS [s/p Lumpec; Dr.Smith]; 91mm;  G-3; margins- NEG; closest- posterior margin-8mm;STAGE- pTis pNx ; ER/PR >90% s/p RT [Dr.Crystal;march 15th 2019 ];   # NO primary prophylaxis/Tamoxifen sec to stroke  # hx of CVA [2012; [facility]-right sided deficits/ mild aphasia; [facility]].   # GENETICS- NEGATIVE [Ofir; [facility]]       Ductal carcinoma in situ (DCIS) of left breast      INTERVAL HISTORY:  Melissa Day 47 y.o.  female pleasant patient above history of DCIS status post lumpectomy followed by radiation is here for follow-up.  Patient is on not on tamoxifen because of history of stroke.  Patient denies any new symptoms of stroke or worsening symptoms.  She denies any lumps or bumps.  Review of Systems  Constitutional: Negative for chills, diaphoresis, fever, malaise/fatigue and weight loss.  HENT: Negative for nosebleeds and sore throat.   Eyes: Negative for double vision.  Respiratory: Negative for cough, hemoptysis, sputum production, shortness of breath and wheezing.   Cardiovascular: Negative for chest pain, palpitations, orthopnea and leg swelling.  Gastrointestinal: Negative for abdominal pain, blood in stool, constipation, diarrhea, heartburn, melena, nausea and vomiting.  Genitourinary: Negative for dysuria, frequency and urgency.  Musculoskeletal: Negative for back pain and joint pain.  Skin: Negative.  Negative for itching and rash.  Neurological: Positive for focal weakness (chronic). Negative for dizziness, tingling, weakness and headaches.  Endo/Heme/Allergies: Does not bruise/bleed easily.  Psychiatric/Behavioral: Negative for depression. The patient is not nervous/anxious and does not have insomnia.       PAST MEDICAL HISTORY  :  Past Medical History:  Diagnosis Date  . Anemia   . Family history of colonic polyps   . H/O: CVA (cerebrovascular accident) 2012   a. cerebral edema, craniotomy, residual right upper & lower limb weakness  . Heart murmur   . High cholesterol   . History of colon polyps   . History of echocardiogram    a. 12/01/2013: EF 50-55%, nl global LV systolic function, mild MR, mildly increased LV posterior wall thickness  . History of stress test    a. 12/01/2013: no significant ischemia, no significant WMA, no EKG changes concerning for ischemia, EF 67%, low risk scan  . Hx of seizure disorder    a. one episode 10 months after CVA, since then on Keppra   . Hypothyroidism   . Kidney stone   . Seizures ([place])   . Stroke ([place])   . Thyroid disease     PAST SURGICAL HISTORY :   Past Surgical History:  Procedure Laterality Date  . BRAIN SURGERY    . BREAST BIOPSY Left 12/13/2016   path pending  . BREAST LUMPECTOMY WITH NEEDLE LOCALIZATION Left 01/06/2017   Procedure: BREAST LUMPECTOMY WITH NEEDLE LOCALIZATION;  Surgeon: Leonie Green, MD;  Location: [facility] ORS;  Service: General;  Laterality: Left;  . COLONOSCOPY WITH PROPOFOL N/A 11/18/2014   Procedure: COLONOSCOPY WITH PROPOFOL;  Surgeon: Josefine Class, MD;  Location: [facility] ENDOSCOPY;  Service: Endoscopy;  Laterality: N/A;  . CYSTOSCOPY/RETROGRADE/URETEROSCOPY/STONE EXTRACTION WITH BASKET Right 10/29/2014   Procedure: CYSTOSCOPY/URETEROSCOPY/STONE EXTRACTION WITH BASKET;  Surgeon: Franchot Gallo, MD;  Location: [facility] ORS;  Service: Urology;  Laterality: Right;  . ESOPHAGOGASTRODUODENOSCOPY (EGD) WITH PROPOFOL N/A 11/18/2014   Procedure:  ESOPHAGOGASTRODUODENOSCOPY (EGD) WITH PROPOFOL;  Surgeon: Josefine Class, MD;  Location: [facility] ENDOSCOPY;  Service: Endoscopy;  Laterality: N/A;  . RE-EXCISION OF BREAST LUMPECTOMY Left 01/25/2017   Procedure: RE-EXCISION OF BREAST LUMPECTOMY;  Surgeon: Leonie Green, MD;  Location:  [facility] ORS;  Service: General;  Laterality: Left;  . TONSILLECTOMY      FAMILY HISTORY :   Family History  Problem Relation Age of Onset  . Nephrolithiasis Mother   . Non-Hodgkin's lymphoma Mother 46       currently 60  . Heart disease Father   . CVA Father   . Nephrolithiasis Maternal Grandfather   . Diabetes Maternal Grandfather   . Pancreatic cancer Maternal Grandfather 26       deceased 78  . Breast cancer Sister 34       negative genetic testing; currently 30  . Lymphoma Maternal Grandmother        deceased 63  . Breast cancer Paternal Aunt        4 more paternal aunts with breast cancer dx 60s-70s  . Breast cancer Cousin        2 paternal cousins; daughters of pat aunt w/ BC at 67  . Colon cancer Cousin 55       son of mat aunt  . Breast cancer Paternal Aunt 80       deceased 41 of MI    SOCIAL HISTORY:   Social History   Tobacco Use  . Smoking status: Former Smoker    Packs/day: 1.00    Years: 15.00    Pack years: 15.00    Types: Cigarettes    Last attempt to quit: 12/30/2011    Years since quitting: 5.6  . Smokeless tobacco: Never Used  Substance Use Topics  . Alcohol use: No    Frequency: Never  . Drug use: No    ALLERGIES:  has No Known Allergies.  MEDICATIONS:  Current Outpatient Medications  Medication Sig Dispense Refill  . aspirin EC 81 MG tablet Take 81 mg by mouth daily.    Marland Kitchen atorvastatin (LIPITOR) 40 MG tablet Take 1 tablet (40 mg total) by mouth daily. 30 tablet 3  . FLUoxetine (PROZAC) 40 MG capsule Take 40 mg by mouth daily.    Marland Kitchen levETIRAcetam (KEPPRA) 750 MG tablet Take 1 tablet (750 mg total) by mouth 2 (two) times daily. 28 tablet 0  . levothyroxine (SYNTHROID, LEVOTHROID) 137 MCG tablet Take 137 mcg daily before breakfast by mouth.     . metFORMIN (GLUCOPHAGE) 500 MG tablet Take 500 mg by mouth daily with breakfast.     No current facility-administered medications for this visit.     PHYSICAL EXAMINATION: ECOG PERFORMANCE STATUS: 0  - Asymptomatic  BP 118/81   Temp (!) 97 F (36.1 C) (Tympanic)   Resp 18   Ht 5\' 3"  (1.6 m)   Wt 201 lb 1 oz (91.2 kg)   BMI 35.62 kg/m   Filed Weights   08/23/17 1350  Weight: 201 lb 1 oz (91.2 kg)    GENERAL: Well-nourished well-developed; Alert, no distress and comfortable.  Accompanied by family.  EYES: no pallor or icterus OROPHARYNX: no thrush or ulceration; NECK: supple; no lymph nodes felt. LYMPH:  no palpable lymphadenopathy in the axillary or inguinal regions LUNGS: Decreased breath sounds auscultation bilaterally. No wheeze or crackles HEART/CVS: regular rate & rhythm and no murmurs; No lower extremity edema ABDOMEN:abdomen soft, non-tender and normal bowel sounds. No hepatomegaly or splenomegaly.  Musculoskeletal:no cyanosis  of digits and no clubbing  PSYCH: alert & oriented x 3 with fluent speech NEURO: no focal motor/sensory deficits SKIN:  no rashes or significant lesions  Right and left BREAST exam [in the presence of nurse]- no unusual skin changes or dominant masses felt. Surgical scars noted.      LABORATORY DATA:  I have reviewed the data as listed    Component Value Date/Time   NA 138 07/29/2017 0721   NA 141 12/01/2013 0436   K 3.6 07/29/2017 0721   K 3.9 12/01/2013 0436   CL 104 07/29/2017 0721   CL 105 12/01/2013 0436   CO2 24 07/29/2017 0721   CO2 27 12/01/2013 0436   GLUCOSE 103 (H) 07/29/2017 0721   GLUCOSE 109 (H) 12/01/2013 0436   BUN 13 07/29/2017 0721   BUN 8 12/01/2013 0436   CREATININE 0.70 07/29/2017 0721   CREATININE 0.81 12/01/2013 0436   CALCIUM 8.9 07/29/2017 0721   CALCIUM 8.4 (L) 12/01/2013 0436   PROT 7.3 07/29/2017 0721   PROT 6.4 01/24/2014 1418   PROT 6.8 11/30/2013 0834   ALBUMIN 4.2 07/29/2017 0721   ALBUMIN 4.3 01/24/2014 1418   ALBUMIN 3.6 11/30/2013 0834   AST 17 07/29/2017 0721   AST 12 (L) 11/30/2013 0834   ALT 22 07/29/2017 0721   ALT 26 11/30/2013 0834   ALKPHOS 84 07/29/2017 0721   ALKPHOS 74  11/30/2013 0834   BILITOT 0.6 07/29/2017 0721   BILITOT 0.4 11/30/2013 0834   GFRNONAA >60 07/29/2017 0721   GFRNONAA >60 12/01/2013 0436   GFRNONAA >60 09/30/2011 1332   GFRAA >60 07/29/2017 0721   GFRAA >60 12/01/2013 0436   GFRAA >60 09/30/2011 1332    No results found for: SPEP, UPEP  Lab Results  Component Value Date   WBC 10.4 07/29/2017   NEUTROABS 6.7 (H) 07/29/2017   HGB 14.7 07/29/2017   HCT 43.1 07/29/2017   MCV 88.5 07/29/2017   PLT 286 07/29/2017      Chemistry      Component Value Date/Time   NA 138 07/29/2017 0721   NA 141 12/01/2013 0436   K 3.6 07/29/2017 0721   K 3.9 12/01/2013 0436   CL 104 07/29/2017 0721   CL 105 12/01/2013 0436   CO2 24 07/29/2017 0721   CO2 27 12/01/2013 0436   BUN 13 07/29/2017 0721   BUN 8 12/01/2013 0436   CREATININE 0.70 07/29/2017 0721   CREATININE 0.81 12/01/2013 0436      Component Value Date/Time   CALCIUM 8.9 07/29/2017 0721   CALCIUM 8.4 (L) 12/01/2013 0436   ALKPHOS 84 07/29/2017 0721   ALKPHOS 74 11/30/2013 0834   AST 17 07/29/2017 0721   AST 12 (L) 11/30/2013 0834   ALT 22 07/29/2017 0721   ALT 26 11/30/2013 0834   BILITOT 0.6 07/29/2017 0721   BILITOT 0.4 11/30/2013 0834       RADIOGRAPHIC STUDIES: I have personally reviewed the radiological images as listed and agreed with the findings in the report. No results found.   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 Grade 3; ER PR positive.  S/p RT [finished on  3/15].  #Stable; no clinical evidence of any recurrence.  Given history of stroke patient is on tamoxifen.  Recommend mammogram in November 2019.  #History of stroke/left sided CVA/mild aphasia-currently on aspirin.  Stable.  # Significant history of breast cancer in the family-genetic  testing Negative.  Reviewed the findings with the patient and family.  # follow up in 6 months/ No labs.; Mammogram nov 2019.      Orders Placed This Encounter  Procedures  . MM DIAG BREAST TOMO BILATERAL    Standing Status:   Future    Standing Expiration Date:   08/24/2018    Order Specific Question:   Reason for Exam (SYMPTOM  OR DIAGNOSIS REQUIRED)    Answer:   Breast cancer    Order Specific Question:   Is the patient pregnant?    Answer:   No    Order Specific Question:   Preferred imaging location?    Answer:   [facility]  . US Breast Limited Uni Left Inc Axilla    Standing Status:   Future    Standing Expiration Date:   10/25/2018    Order Specific Question:   Reason for Exam (SYMPTOM  OR DIAGNOSIS REQUIRED)    Answer:   Breast cancer    Order Specific Question:   Preferred imaging location?    Answer:   [facility]  . US Breast Limited Uni Right Inc Axilla    Standing Status:   Future    Standing Expiration Date:   10/25/2018    Order Specific Question:   Reason for Exam (SYMPTOM  OR DIAGNOSIS REQUIRED)    Answer:   Breast cancer    Order Specific Question:   Preferred imaging location?    Answer:   [facility] [place]   All questions were answered. The patient knows to call the clinic with any problems, questions or concerns.      Cammie Sickle, MD 08/23/2017 5:46 PM

## 2017-08-24 ENCOUNTER — Encounter: Payer: Self-pay | Admitting: Oncology

## 2017-10-11 ENCOUNTER — Encounter: Payer: Self-pay | Admitting: Oncology

## 2017-11-03 ENCOUNTER — Other Ambulatory Visit: Payer: Self-pay | Admitting: Internal Medicine

## 2017-11-03 DIAGNOSIS — R221 Localized swelling, mass and lump, neck: Secondary | ICD-10-CM

## 2017-11-08 ENCOUNTER — Ambulatory Visit
Admission: RE | Admit: 2017-11-08 | Discharge: 2017-11-08 | Disposition: A | Discharge status: Home / Self Care | Payer: Medicare Other | Source: Ambulatory Visit | Attending: Internal Medicine | Admitting: Internal Medicine

## 2017-11-08 DIAGNOSIS — R221 Localized swelling, mass and lump, neck: Secondary | ICD-10-CM

## 2017-11-08 DIAGNOSIS — K1121 Acute sialoadenitis: Secondary | ICD-10-CM | POA: Insufficient documentation

## 2017-11-08 DIAGNOSIS — R911 Solitary pulmonary nodule: Secondary | ICD-10-CM | POA: Diagnosis not present

## 2017-11-08 HISTORY — DX: Malignant (primary) neoplasm, unspecified: C80.1

## 2017-11-08 MED ORDER — IOHEXOL 300 MG/ML  SOLN
75.0000 mL | Freq: Once | INTRAMUSCULAR | Status: AC | PRN
  Administered 2017-11-08: 75 mL via INTRAVENOUS

## 2017-11-17 ENCOUNTER — Ambulatory Visit
Admission: RE | Admit: 2017-11-17 | Discharge: 2017-11-17 | Disposition: A | Discharge status: Home / Self Care | Payer: Medicare Other | Source: Ambulatory Visit | Attending: Radiation Oncology | Admitting: Radiation Oncology

## 2017-11-17 ENCOUNTER — Other Ambulatory Visit: Payer: Self-pay | Admitting: *Deleted

## 2017-11-17 ENCOUNTER — Other Ambulatory Visit: Payer: Self-pay

## 2017-11-17 ENCOUNTER — Encounter: Payer: Self-pay | Admitting: Radiation Oncology

## 2017-11-17 DIAGNOSIS — C50511 Malignant neoplasm of lower-outer quadrant of right female breast: Secondary | ICD-10-CM

## 2017-11-17 DIAGNOSIS — R911 Solitary pulmonary nodule: Secondary | ICD-10-CM

## 2017-11-17 NOTE — Progress Notes (Signed)
Radiation Oncology Follow up Note  Name: Melissa Day   Date:   11/17/2017 MRN:  [id number] DOB: [birth year]    This 47 y.o. female presents to the clinic today for 6 month follow-up status post whole breast radiation to her left breast for ductal carcinoma in situ.  REFERRING PROVIDER: Tracie Harrier, MD  HPI: Melissa Day is a we 47-year-old female now out 6 months having completed whole breast radiation therapy to her left breast for ductal carcinoma in situ. She is seen today in routine follow-up and is doing well. She specifically denies breast tenderness cough or bone pain..she is scheduled for mammogram later this month. She has a history of CVA is not on antiestrogen therapy.she was being evaluated forsialolithiasisand CT scan of the head and neck region showed a 5-6 mm nodule in the right upper lobe. She is asymptomatic from that.  COMPLICATIONS OF TREATMENT: none  FOLLOW UP COMPLIANCE: keeps appointments   PHYSICAL EXAM:  BP (P) 126/85 (BP Location: Left Arm, Patient Position: Sitting)   Pulse (P) 85   Temp (!) (P) 97.3 F (36.3 C) (Tympanic)   Wt (P) 210 lb 6.9 oz (95.5 kg)   BMI (P) 37.28 kg/m  Lungs are clear to A&P cardiac examination essentially unremarkable with regular rate and rhythm. No dominant mass or nodularity is noted in either breast in 2 positions examined. Incision is well-healed. No axillary or supraclavicular adenopathy is appreciated. Cosmetic result is excellent.Well-developed well-nourished patient in NAD. HEENT reveals PERLA, EOMI, discs not visualized.  Oral cavity is clear. No oral mucosal lesions are identified. Neck is clear without evidence of cervical or supraclavicular adenopathy. Lungs are clear to A&P. Cardiac examination is essentially unremarkable with regular rate and rhythm without murmur rub or thrill. Abdomen is benign with no organomegaly or masses noted. Motor sensory and DTR levels are equal and symmetric in the upper and lower extremities.  Cranial nerves II through XII are grossly intact. Proprioception is intact. No peripheral adenopathy or edema is identified. No motor or sensory levels are noted. Crude visual fields are within normal range.  RADIOLOGY RESULTS: CT scans of the head and neck region and lung are reviewed  PLAN: present time she is doing well from a breast standpoint will have follow-up mammograms this month. I have reviewed her CT scan of the chest I believe a follow-up CT scan in about 3-4 months with contrast prior to her next follow-up visit with me would discern whether this is a growing lesion of concern or just an incidental finding. Patient and sister both compress my treatment plan well. CT scan and follow-up plan was schedule. She will also be seeing Dr. Ezzie Dural in medical oncology for continuation of follow-up. Patient is to call with any concerns.  I would like to take this opportunity to thank you for allowing me to participate in the care of your patient.Noreene Filbert, MD

## 2017-11-21 ENCOUNTER — Ambulatory Visit
Admission: RE | Admit: 2017-11-21 | Discharge: 2017-11-21 | Disposition: A | Discharge status: Home / Self Care | Payer: Medicare Other | Source: Ambulatory Visit | Attending: Internal Medicine | Admitting: Internal Medicine

## 2017-11-21 DIAGNOSIS — D0512 Intraductal carcinoma in situ of left breast: Secondary | ICD-10-CM

## 2017-11-21 HISTORY — DX: Personal history of irradiation: Z92.3

## 2017-12-08 ENCOUNTER — Encounter
Admission: RE | Admit: 2017-12-08 | Discharge: 2017-12-08 | Disposition: A | Discharge status: Home / Self Care | Payer: Medicare Other | Source: Ambulatory Visit | Attending: Otolaryngology | Admitting: Otolaryngology

## 2017-12-08 ENCOUNTER — Other Ambulatory Visit: Payer: Self-pay

## 2017-12-08 DIAGNOSIS — Z01812 Encounter for preprocedural laboratory examination: Secondary | ICD-10-CM | POA: Diagnosis not present

## 2017-12-08 HISTORY — DX: Personal history of urinary calculi: Z87.442

## 2017-12-08 HISTORY — DX: Type 2 diabetes mellitus without complications: E11.9

## 2017-12-08 LAB — BASIC METABOLIC PANEL
ANION GAP: 9 (ref 5–15)
BUN: 9 mg/dL (ref 6–20)
CALCIUM: 8.4 mg/dL — AB (ref 8.9–10.3)
CO2: 25 mmol/L (ref 22–32)
Chloride: 102 mmol/L (ref 98–111)
Creatinine, Ser: 0.56 mg/dL (ref 0.44–1.00)
GFR calc Af Amer: >60 mL/min (ref >60)
GFR calc non Af Amer: >60 mL/min (ref >60)
GLUCOSE: 132 mg/dL — AB (ref 70–99)
Potassium: 3.8 mmol/L (ref 3.5–5.1)
Sodium: 136 mmol/L (ref 135–145)

## 2017-12-08 NOTE — Patient Instructions (Addendum)
Your procedure is scheduled on: Thurs 11/7 Report to Day Surgery. To find out your arrival time please call [phone] between 1PM - 3PM on Wed 11/6.  Remember: Instructions that are not followed completely may result in serious medical risk,  up to and including death, or upon the discretion of your surgeon and anesthesiologist your  surgery may need to be rescheduled.     _X__ 1. Do not eat food after midnight the night before your procedure.                 No gum chewing or hard candies. You may drink clear liquids up to 2 hours                 before you are scheduled to arrive for your surgery- DO not drink clear                 liquids within 2 hours of the start of your surgery.                 Clear Liquids include:  water, __X__2.  On the morning of surgery brush your teeth with toothpaste and water, you                may rinse your mouth with mouthwash if you wish.  Do not swallow any toothpaste of mouthwash.     _X__ 3.  No Alcohol for 24 hours before or after surgery.   ___ 4.  Do Not Smoke or use e-cigarettes For 24 Hours Prior to Your Surgery.                 Do not use any chewable tobacco products for at least 6 hours prior to                 surgery.  ____  5.  Bring all medications with you on the day of surgery if instructed.   ___x_  6.  Notify your doctor if there is any change in your medical condition      (cold, fever, infections).     Do not wear jewelry, make-up, hairpins, clips or nail polish. Do not wear lotions, powders, or perfumes. You may wear deodorant. Do not shave 48 hours prior to surgery. Men may shave face and neck. Do not bring valuables to the hospital.    [facility] is not responsible for any belongings or valuables.  Contacts, dentures or bridgework may not be worn into surgery. Leave your suitcase in the car. After surgery it may be brought to your room. For patients admitted to the hospital, discharge time  is determined by your treatment team.   Patients discharged the day of surgery will not be allowed to drive home.   Please read over the following fact sheets that you were given:     __x__ Take these medicines the morning of surgery with A SIP OF WATER:    1. atorvastatin (LIPITOR) 40 MG tablet  2. FLUoxetine (PROZAC) 40 MG capsule  3. levETIRAcetam (KEPPRA) 750 MG tablet  4.levothyroxine (SYNTHROID, LEVOTHROID) 137 MCG tablet  5.  6.  ____ Fleet Enema (as directed)   ____ Use CHG Soap as directed  ____ Use inhalers on the day of surgery  _x___ Stop metformin 2 days prior to surgery last dose Mon 11/4    ____ Take 1/2 of usual insulin dose the night before surgery. No insulin the morning  of surgery.   __x__ Stopped aspirin on 10/28  ____ Stop Anti-inflammatories on    ____ Stop supplements until after surgery.    ____ Bring C-Pap to the hospital.

## 2017-12-15 ENCOUNTER — Other Ambulatory Visit: Payer: Self-pay

## 2017-12-15 ENCOUNTER — Ambulatory Visit: Payer: Medicare Other | Admitting: Registered Nurse

## 2017-12-15 ENCOUNTER — Observation Stay
Admission: RE | Admit: 2017-12-15 | Discharge: 2017-12-16 | Disposition: A | Discharge status: Home / Self Care | Payer: Medicare Other | Source: Ambulatory Visit | Attending: Otolaryngology | Admitting: Otolaryngology

## 2017-12-15 ENCOUNTER — Encounter
Admission: RE | Disposition: A | Discharge status: Home / Self Care | Payer: Self-pay | Source: Ambulatory Visit | Attending: Otolaryngology

## 2017-12-15 DIAGNOSIS — I69351 Hemiplegia and hemiparesis following cerebral infarction affecting right dominant side: Secondary | ICD-10-CM | POA: Insufficient documentation

## 2017-12-15 DIAGNOSIS — F329 Major depressive disorder, single episode, unspecified: Secondary | ICD-10-CM | POA: Diagnosis not present

## 2017-12-15 DIAGNOSIS — Z7982 Long term (current) use of aspirin: Secondary | ICD-10-CM | POA: Insufficient documentation

## 2017-12-15 DIAGNOSIS — Z7984 Long term (current) use of oral hypoglycemic drugs: Secondary | ICD-10-CM | POA: Diagnosis not present

## 2017-12-15 DIAGNOSIS — Z9089 Acquired absence of other organs: Secondary | ICD-10-CM

## 2017-12-15 DIAGNOSIS — Z79899 Other long term (current) drug therapy: Secondary | ICD-10-CM | POA: Insufficient documentation

## 2017-12-15 DIAGNOSIS — Z853 Personal history of malignant neoplasm of breast: Secondary | ICD-10-CM | POA: Insufficient documentation

## 2017-12-15 DIAGNOSIS — G40909 Epilepsy, unspecified, not intractable, without status epilepticus: Secondary | ICD-10-CM | POA: Insufficient documentation

## 2017-12-15 DIAGNOSIS — E7800 Pure hypercholesterolemia, unspecified: Secondary | ICD-10-CM | POA: Insufficient documentation

## 2017-12-15 DIAGNOSIS — Z9889 Other specified postprocedural states: Secondary | ICD-10-CM

## 2017-12-15 DIAGNOSIS — Z87891 Personal history of nicotine dependence: Secondary | ICD-10-CM | POA: Diagnosis not present

## 2017-12-15 DIAGNOSIS — I69320 Aphasia following cerebral infarction: Secondary | ICD-10-CM | POA: Insufficient documentation

## 2017-12-15 DIAGNOSIS — Z923 Personal history of irradiation: Secondary | ICD-10-CM | POA: Diagnosis not present

## 2017-12-15 DIAGNOSIS — E119 Type 2 diabetes mellitus without complications: Secondary | ICD-10-CM | POA: Insufficient documentation

## 2017-12-15 DIAGNOSIS — K115 Sialolithiasis: Principal | ICD-10-CM | POA: Insufficient documentation

## 2017-12-15 DIAGNOSIS — E039 Hypothyroidism, unspecified: Secondary | ICD-10-CM | POA: Diagnosis not present

## 2017-12-15 HISTORY — PX: SUBMANDIBULAR GLAND EXCISION: SHX2456

## 2017-12-15 LAB — GLUCOSE, CAPILLARY
Glucose-Capillary: 131 mg/dL — ABNORMAL HIGH (ref 70–99)
Glucose-Capillary: 163 mg/dL — ABNORMAL HIGH (ref 70–99)

## 2017-12-15 LAB — POCT PREGNANCY, URINE: PREG TEST UR: NEGATIVE

## 2017-12-15 SURGERY — EXCISION, SUBMANDIBULAR GLAND
Anesthesia: General | Laterality: Right

## 2017-12-15 MED ORDER — EPHEDRINE SULFATE 50 MG/ML IJ SOLN
INTRAMUSCULAR | Status: AC
  Filled 2017-12-15: qty 1

## 2017-12-15 MED ORDER — FAMOTIDINE 20 MG PO TABS
ORAL_TABLET | ORAL | Status: AC
  Administered 2017-12-15: 20 mg via ORAL
  Filled 2017-12-15: qty 1

## 2017-12-15 MED ORDER — ATORVASTATIN CALCIUM 20 MG PO TABS: 40.0000 mg | ORAL_TABLET | Freq: Every day | ORAL | Status: DC

## 2017-12-15 MED ORDER — BACITRACIN ZINC 500 UNIT/GM EX OINT
TOPICAL_OINTMENT | CUTANEOUS | Status: AC
  Filled 2017-12-15: qty 28.35

## 2017-12-15 MED ORDER — PROPOFOL 10 MG/ML IV BOLUS
INTRAVENOUS | Status: AC
  Filled 2017-12-15: qty 20

## 2017-12-15 MED ORDER — MIDAZOLAM HCL 2 MG/2ML IJ SOLN
INTRAMUSCULAR | Status: AC
  Filled 2017-12-15: qty 2

## 2017-12-15 MED ORDER — FENTANYL CITRATE (PF) 100 MCG/2ML IJ SOLN
INTRAMUSCULAR | Status: AC
  Filled 2017-12-15: qty 2

## 2017-12-15 MED ORDER — ACETAMINOPHEN 10 MG/ML IV SOLN
INTRAVENOUS | Status: DC | PRN
  Administered 2017-12-15: 1000 mg via INTRAVENOUS

## 2017-12-15 MED ORDER — MORPHINE SULFATE (PF) 2 MG/ML IV SOLN
2.0000 mg | INTRAVENOUS | Status: DC | PRN
  Administered 2017-12-15: 2 mg via INTRAVENOUS
  Filled 2017-12-15: qty 1

## 2017-12-15 MED ORDER — MEPERIDINE HCL 50 MG/ML IJ SOLN: 6.2500 mg | INTRAMUSCULAR | Status: DC | PRN

## 2017-12-15 MED ORDER — DEXAMETHASONE SODIUM PHOSPHATE 10 MG/ML IJ SOLN
INTRAMUSCULAR | Status: DC | PRN
  Administered 2017-12-15: 10 mg via INTRAVENOUS

## 2017-12-15 MED ORDER — PROMETHAZINE HCL 25 MG/ML IJ SOLN: 6.2500 mg | INTRAMUSCULAR | Status: DC | PRN

## 2017-12-15 MED ORDER — LIDOCAINE HCL (CARDIAC) PF 100 MG/5ML IV SOSY
PREFILLED_SYRINGE | INTRAVENOUS | Status: DC | PRN
  Administered 2017-12-15: 100 mg via INTRAVENOUS

## 2017-12-15 MED ORDER — ONDANSETRON HCL 4 MG/2ML IJ SOLN
INTRAMUSCULAR | Status: DC | PRN
  Administered 2017-12-15: 4 mg via INTRAVENOUS

## 2017-12-15 MED ORDER — HYDROCODONE-ACETAMINOPHEN 7.5-325 MG/15ML PO SOLN
10.0000 mL | ORAL | Status: DC | PRN
  Administered 2017-12-15: 15 mL via ORAL
  Filled 2017-12-15: qty 15

## 2017-12-15 MED ORDER — FENTANYL CITRATE (PF) 100 MCG/2ML IJ SOLN: 25.0000 ug | INTRAMUSCULAR | Status: DC | PRN

## 2017-12-15 MED ORDER — LEVOTHYROXINE SODIUM 137 MCG PO TABS
137.0000 ug | ORAL_TABLET | Freq: Every day | ORAL | Status: DC
  Administered 2017-12-16: 137 ug via ORAL
  Filled 2017-12-15: qty 1

## 2017-12-15 MED ORDER — PROPOFOL 10 MG/ML IV BOLUS
INTRAVENOUS | Status: DC | PRN
  Administered 2017-12-15: 20 mg via INTRAVENOUS
  Administered 2017-12-15 (×2): 30 mg via INTRAVENOUS
  Administered 2017-12-15: 20 mg via INTRAVENOUS
  Administered 2017-12-15: 40 mg via INTRAVENOUS
  Administered 2017-12-15: 170 mg via INTRAVENOUS

## 2017-12-15 MED ORDER — OXYCODONE HCL 5 MG/5ML PO SOLN: 5.0000 mg | Freq: Once | ORAL | Status: DC | PRN

## 2017-12-15 MED ORDER — ONDANSETRON HCL 4 MG/2ML IJ SOLN: 4.0000 mg | INTRAMUSCULAR | Status: DC | PRN

## 2017-12-15 MED ORDER — OXYCODONE HCL 5 MG PO TABS: 5.0000 mg | ORAL_TABLET | Freq: Once | ORAL | Status: DC | PRN

## 2017-12-15 MED ORDER — LIDOCAINE HCL (PF) 2 % IJ SOLN
INTRAMUSCULAR | Status: AC
  Filled 2017-12-15: qty 10

## 2017-12-15 MED ORDER — GLYCOPYRROLATE 0.2 MG/ML IJ SOLN
INTRAMUSCULAR | Status: AC
  Filled 2017-12-15: qty 1

## 2017-12-15 MED ORDER — SENNOSIDES-DOCUSATE SODIUM 8.6-50 MG PO TABS: 1.0000 | ORAL_TABLET | Freq: Every evening | ORAL | Status: DC | PRN

## 2017-12-15 MED ORDER — ONDANSETRON HCL 4 MG PO TABS: 4.0000 mg | ORAL_TABLET | ORAL | Status: DC | PRN

## 2017-12-15 MED ORDER — LIDOCAINE-EPINEPHRINE 1 %-1:100000 IJ SOLN
INTRAMUSCULAR | Status: DC | PRN
  Administered 2017-12-15: 4 mL

## 2017-12-15 MED ORDER — FENTANYL CITRATE (PF) 100 MCG/2ML IJ SOLN
INTRAMUSCULAR | Status: DC | PRN
  Administered 2017-12-15: 25 ug via INTRAVENOUS
  Administered 2017-12-15 (×4): 50 ug via INTRAVENOUS

## 2017-12-15 MED ORDER — PHENYLEPHRINE HCL 10 MG/ML IJ SOLN
INTRAMUSCULAR | Status: AC
  Filled 2017-12-15: qty 1

## 2017-12-15 MED ORDER — SODIUM CHLORIDE 0.45 % IV SOLN
INTRAVENOUS | Status: DC
  Administered 2017-12-15: 14:00:00 via INTRAVENOUS

## 2017-12-15 MED ORDER — FAMOTIDINE 20 MG PO TABS
20.0000 mg | ORAL_TABLET | Freq: Once | ORAL | Status: AC
  Administered 2017-12-15: 20 mg via ORAL

## 2017-12-15 MED ORDER — DEXAMETHASONE SODIUM PHOSPHATE 10 MG/ML IJ SOLN
INTRAMUSCULAR | Status: AC
  Filled 2017-12-15: qty 1

## 2017-12-15 MED ORDER — FLUOXETINE HCL 20 MG PO CAPS
40.0000 mg | ORAL_CAPSULE | Freq: Every day | ORAL | Status: DC
  Administered 2017-12-15: 40 mg via ORAL
  Filled 2017-12-15 (×2): qty 2

## 2017-12-15 MED ORDER — ONDANSETRON HCL 4 MG/2ML IJ SOLN
INTRAMUSCULAR | Status: AC
  Filled 2017-12-15: qty 2

## 2017-12-15 MED ORDER — ZOLPIDEM TARTRATE 5 MG PO TABS: 5.0000 mg | ORAL_TABLET | Freq: Every evening | ORAL | Status: DC | PRN

## 2017-12-15 MED ORDER — ACETAMINOPHEN 160 MG/5ML PO SOLN
650.0000 mg | ORAL | Status: DC | PRN
  Filled 2017-12-15: qty 20.3

## 2017-12-15 MED ORDER — MIDAZOLAM HCL 2 MG/2ML IJ SOLN
INTRAMUSCULAR | Status: DC | PRN
  Administered 2017-12-15 (×2): 2 mg via INTRAVENOUS

## 2017-12-15 MED ORDER — LEVETIRACETAM 750 MG PO TABS
750.0000 mg | ORAL_TABLET | Freq: Two times a day (BID) | ORAL | Status: DC
  Administered 2017-12-15: 750 mg via ORAL
  Filled 2017-12-15 (×3): qty 1

## 2017-12-15 MED ORDER — SODIUM CHLORIDE 0.9 % IV SOLN
INTRAVENOUS | Status: DC
  Administered 2017-12-15: 07:00:00 via INTRAVENOUS

## 2017-12-15 MED ORDER — LIDOCAINE-EPINEPHRINE 1 %-1:100000 IJ SOLN
INTRAMUSCULAR | Status: AC
  Filled 2017-12-15: qty 1

## 2017-12-15 MED ORDER — ACETAMINOPHEN 10 MG/ML IV SOLN
INTRAVENOUS | Status: AC
  Filled 2017-12-15: qty 100

## 2017-12-15 MED ORDER — METFORMIN HCL 500 MG PO TABS: 500.0000 mg | ORAL_TABLET | Freq: Every day | ORAL | Status: DC

## 2017-12-15 MED ORDER — ACETAMINOPHEN 650 MG RE SUPP: 650.0000 mg | RECTAL | Status: DC | PRN

## 2017-12-15 SURGICAL SUPPLY — 41 items
BLADE SURG 15 STRL LF DISP TIS (BLADE) ×1 IMPLANT
BLADE SURG 15 STRL SS (BLADE) ×2
CANISTER SUCT 1200ML W/VALVE (MISCELLANEOUS) ×3 IMPLANT
CLOSURE WOUND 1/4X4 (GAUZE/BANDAGES/DRESSINGS)
CNTNR SPEC 2.5X3XGRAD LEK (MISCELLANEOUS)
CONT SPEC 4OZ STER OR WHT (MISCELLANEOUS)
CONTAINER SPEC 2.5X3XGRAD LEK (MISCELLANEOUS) IMPLANT
CORD BIP STRL DISP 12FT (MISCELLANEOUS) ×3 IMPLANT
COVER WAND RF STERILE (DRAPES) ×3 IMPLANT
DERMABOND ADVANCED (GAUZE/BANDAGES/DRESSINGS)
DERMABOND ADVANCED .7 DNX12 (GAUZE/BANDAGES/DRESSINGS) IMPLANT
DRAIN TLS ROUND 10FR (DRAIN) IMPLANT
DRAPE MAG INST 16X20 L/F (DRAPES) ×3 IMPLANT
DRSG TEGADERM 2-3/8X2-3/4 SM (GAUZE/BANDAGES/DRESSINGS) IMPLANT
ELECT NEEDLE 20X.3 GREEN (MISCELLANEOUS)
ELECT REM PT RETURN 9FT ADLT (ELECTROSURGICAL) ×3
ELECTRODE NEEDLE 20X.3 GREEN (MISCELLANEOUS) IMPLANT
ELECTRODE REM PT RTRN 9FT ADLT (ELECTROSURGICAL) ×1 IMPLANT
FORCEPS JEWEL BIP 4-3/4 STR (INSTRUMENTS) ×3 IMPLANT
GAUZE 4X4 16PLY RFD (DISPOSABLE) IMPLANT
GLOVE BIO SURGEON STRL SZ7.5 (GLOVE) ×6 IMPLANT
GOWN STRL REUS W/ TWL LRG LVL3 (GOWN DISPOSABLE) ×3 IMPLANT
GOWN STRL REUS W/TWL LRG LVL3 (GOWN DISPOSABLE) ×6
HEMOSTAT SURGICEL 2X3 (HEMOSTASIS) ×3 IMPLANT
HOOK STAY BLUNT/RETRACTOR 5M (MISCELLANEOUS) IMPLANT
LABEL OR SOLS (LABEL) ×3 IMPLANT
NS IRRIG 500ML POUR BTL (IV SOLUTION) ×3 IMPLANT
PACK HEAD/NECK (MISCELLANEOUS) ×3 IMPLANT
PROBE MONO 100X0.75 ELECT 1.9M (MISCELLANEOUS) ×3 IMPLANT
SHEARS HARMONIC 9CM CVD (BLADE) ×3 IMPLANT
SPONGE KITTNER 5P (MISCELLANEOUS) ×9 IMPLANT
STRIP CLOSURE SKIN 1/4X4 (GAUZE/BANDAGES/DRESSINGS) IMPLANT
SUT ETHILON 4-0 (SUTURE) ×2
SUT ETHILON 4-0 FS2 18XMFL BLK (SUTURE) ×1
SUT PROLENE 6 0 P 1 18 (SUTURE) IMPLANT
SUT SILK 2 0 (SUTURE) ×2
SUT SILK 2 0 SH (SUTURE) ×3 IMPLANT
SUT SILK 2-0 18XBRD TIE 12 (SUTURE) ×1 IMPLANT
SUT VIC AB 4-0 RB1 18 (SUTURE) ×3 IMPLANT
SUTURE ETHLN 4-0 FS2 18XMF BLK (SUTURE) ×1 IMPLANT
SYSTEM CHEST DRAIN TLS 7FR (DRAIN) ×3 IMPLANT

## 2017-12-15 NOTE — Progress Notes (Signed)
..   12/15/2017 4:35 PM  Melissa Day [id number]  Post-Op Day 0    Temp:  [97.3 F (36.3 C)-98.4 F (36.9 C)] 98.4 F (36.9 C) (11/07 1508) Pulse Rate:  [82-113] 94 (11/07 1508) Resp:  [13-20] 16 (11/07 1508) BP: (120-133)/(66-92) 120/72 (11/07 1508) SpO2:  [93 %-100 %] 94 % (11/07 1508),     Intake/Output Summary (Last 24 hours) at 12/15/2017 1635 Last data filed at 12/15/2017 1043 Gross per 24 hour  Intake 800 ml  Output 25 ml  Net 775 ml    Results for orders placed or performed during the hospital encounter of 12/15/17 (from the past 24 hour(s))  Glucose, capillary     Status: Abnormal   Collection Time: 12/15/17  6:14 AM  Result Value Ref Range   Glucose-Capillary 131 (H) 70 - 99 mg/dL  Pregnancy, urine POC     Status: None   Collection Time: 12/15/17  6:15 AM  Result Value Ref Range   Preg Test, Ur NEGATIVE NEGATIVE  Glucose, capillary     Status: Abnormal   Collection Time: 12/15/17  9:17 AM  Result Value Ref Range   Glucose-Capillary 163 (H) 70 - 99 mg/dL    SUBJECTIVE:  No acute events.  Tolerating PO.  Pain controlled with medications.  Ambulating to BR.  OBJECTIVE:  GEN-  NAD, sitting upright in bed NECK-  Incision c/d/i with drain in palce  IMPRESSION:  S/p right submandibular gland excision  PLAN:  Saline lock IV.  Change to soft diabetic diet.  Anticipate D/C tomorrow.  Melissa Day 12/15/2017, 4:35 PM

## 2017-12-15 NOTE — Op Note (Signed)
..  12/15/2017  9:02 AM    [name]  [phone]   Pre-Op Dx: Siaolithiasis  Post-op Dx: same  Proc: Right Submandibular Gland Excision  Surg: Carloyn Manner  Asst:  Richardson Landry, Scott  Anes:  GOT  EBL: <65ml  Comp:  none  Findings:  Large right submandibular gland firm with large stone in hilum.  Lingual nerve identified and preserved.  Patient near middle of case appeared to have generalized tonic clonic seizure for approximately 1 minute.  Other than increased heart rate, no parameters changed.  Procedure:  After the patient was identified in hold and the history and physical and consent was reviewed and updated. The patient was marked in the normal fashion in an existing skin crease of his right neck just below the level of the submandibular gland approximately 2 finger lengths below the body of the mandible. The patient was next taken to the operating room and placed in a supine position. General endotracheal anesthesia was induced in the normal fashion. The patient's right neck was neck injected with 4cc's of 1% lidocaine with 1:100,000 Epinephrine. The patient was next prepped and draped in a sterile normal fashion.  At this time, a 15 blade scalpel was used to make a skin incision along the previously marked neck crease in the right neck after the proper time out was performed. Dissection was carefully performed through the subcutaneous tissues with combination of Bovie electrocautery and blunt dissection. The platysma muscle was divided with harmonic scalpel.  A nerve stimulator was used to ensure no injury to the marginal mandibular nerve was made. Blunt dissection inferiorly along the inferior edge of the submandibular gland was performed.  The fascia overlying the gland was divided and retracted superiorly with a Sin retractor.  Continued blunt and sharp dissection with Harmonic Scalpel was performed circumferentially around the gland.  The facial artery was noted  posteriorly and was separated from the gland with use of Harmonic scalpel.  Firm attachments to the mylohyoid were divided and this was retracted superiorly.  The Lingual nerve along the superior aspect of the gland and duct was identified.  This was moderately fibrotically attached to the underlying gland and Wharton's duct.  The submandibular ganglion was divided with Harmonic Scalpel with care taken to avoid injury to the Lingual nerve.  The remaining attachments of the gland deeply were next carefully transected with the Harmonic scalpel again with taking care to avoid injury to the hypoglossal and lingual nerves.  At this time, with the gland pedicled on a dilated Wharton's duct, the duct was crossed with a right angle clamp and then divided with Harmonic scalpel and tied with a 2.0 Silk suture.   At this time, the wound was copiously irrigated with sterile saline. Meticulous hemostasis with bipolar was obtained. Surgicel was placed in the wound bed. A small TLS drain was placed posteriorly in the wound bed. The wound was then closed in a multilayered fashion with vicryl for subcutaneous tissues and Dermabond for the skin closure.  At this time the patient was extubated and taken to PACU in good condition.    Dispo: PACU in good condition.  Plan:  Ice, elevation, analgesia.  Will discuss seizure during procedure with Neurology.  Madden Garron 12/15/2017 9:02 AM

## 2017-12-15 NOTE — Anesthesia Post-op Follow-up Note (Signed)
Anesthesia QCDR form completed.        

## 2017-12-15 NOTE — Anesthesia Procedure Notes (Signed)
Procedure Name: Intubation Date/Time: 12/15/2017 7:29 AM Performed by: Doreen Salvage, CRNA Pre-anesthesia Checklist: Patient identified, Patient being monitored, Timeout performed, Emergency Drugs available and Suction available Patient Re-evaluated:Patient Re-evaluated prior to induction Oxygen Delivery Method: Circle system utilized Preoxygenation: Pre-oxygenation with 100% oxygen Induction Type: IV induction Ventilation: Mask ventilation without difficulty Laryngoscope Size: Mac, 3 and McGraph Grade View: Grade I Tube type: Oral Tube size: 7.0 mm Number of attempts: 2 Airway Equipment and Method: Stylet Placement Confirmation: ETT inserted through vocal cords under direct vision,  positive ETCO2 and breath sounds checked- equal and bilateral Secured at: 22 cm Tube secured with: Tape Dental Injury: Teeth and Oropharynx as per pre-operative assessment  Difficulty Due To: Difficult Airway- due to dentition Comments: Grade 1 view with Mcgrath MAC3  Blade. Grade 3 view with DL.

## 2017-12-15 NOTE — Anesthesia Postprocedure Evaluation (Signed)
Anesthesia Post Note  Patient: Melissa Day  Procedure(s) Performed: EXCISION SUBMANDIBULAR GLAND (Right )  Patient location during evaluation: PACU Anesthesia Type: General Level of consciousness: awake and alert and oriented Pain management: pain level controlled Vital Signs Assessment: post-procedure vital signs reviewed and stable Respiratory status: spontaneous breathing, nonlabored ventilation and respiratory function stable Cardiovascular status: blood pressure returned to baseline and stable Postop Assessment: no signs of nausea or vomiting Anesthetic complications: no   Pt had seizure intraop, no new neurologic deficits in the PACU, at baseline, discussed followup with Neurology if has more seizures   Last Vitals:  Vitals:   12/15/17 1013 12/15/17 1028  BP: 128/79 120/84  Pulse: (!) 106 99  Resp: 15 17  Temp:    SpO2: 95% 94%    Last Pain:  Vitals:   12/15/17 1028  TempSrc:   PainSc: 0-No pain                 Arin Peral

## 2017-12-15 NOTE — Transfer of Care (Signed)
Immediate Anesthesia Transfer of Care Note  Patient: Melissa Day  Procedure(s) Performed: Procedure(s): EXCISION SUBMANDIBULAR GLAND (Right)  Patient Location: PACU  Anesthesia Type:General  Level of Consciousness: sedated  Airway & Oxygen Therapy: Patient Spontanous Breathing and Patient connected to face mask oxygen  Post-op Assessment: Report given to RN and Post -op Vital signs reviewed and stable  Post vital signs: Reviewed and stable  Last Vitals:  Vitals:   12/15/17 0609 12/15/17 0913  BP: (!) 130/92 124/66  Pulse: 82 (!) 101  Resp: 16 17  Temp: 36.5 C 36.4 C  SpO2: 320% 23%    Complications: No apparent anesthesia complications

## 2017-12-15 NOTE — H&P (Signed)
..  History and Physical paper copy reviewed and updated date of procedure and will be scanned into system.  Patient seen and examined and marked.  

## 2017-12-15 NOTE — Anesthesia Preprocedure Evaluation (Signed)
Anesthesia Evaluation  Patient identified by MRN, date of birth, ID band Patient awake    Reviewed: Allergy & Precautions, NPO status , Patient's Chart, lab work & pertinent test results  History of Anesthesia Complications Negative for: history of anesthetic complications  Airway Mallampati: II  TM Distance: >3 FB Neck ROM: Full    Dental no notable dental hx.    Pulmonary neg sleep apnea, neg COPD, former smoker,    breath sounds clear to auscultation- rhonchi (-) wheezing      Cardiovascular (-) hypertension(-) CAD, (-) Past MI, (-) Cardiac Stents and (-) CABG  Rhythm:Regular Rate:Normal - Systolic murmurs and - Diastolic murmurs    Neuro/Psych Seizures -, Well Controlled,  PSYCHIATRIC DISORDERS Depression CVA (aphasia, R sided weakness), Residual Symptoms    GI/Hepatic negative GI ROS, Neg liver ROS,   Endo/Other  diabetes, Oral Hypoglycemic AgentsHypothyroidism   Renal/GU negative Renal ROS     Musculoskeletal negative musculoskeletal ROS (+)   Abdominal (+) + obese,   Peds  Hematology  (+) anemia ,   Anesthesia Other Findings Past Medical History: No date: Anemia 2018: Cancer ([place])     Comment:  br cancer on left with lump and rad tx  No date: Diabetes mellitus without complication ([place]) No date: Family history of colonic polyps 2012: H/O: CVA (cerebrovascular accident)     Comment:  a. cerebral edema, craniotomy, residual right upper &               lower limb weakness No date: Heart murmur No date: High cholesterol No date: History of colon polyps No date: History of echocardiogram     Comment:  a. 12/01/2013: EF 50-55%, nl global LV systolic               function, mild MR, mildly increased LV posterior wall               thickness No date: History of kidney stones No date: History of stress test     Comment:  a. 12/01/2013: no significant ischemia, no significant               WMA, no EKG changes  concerning for ischemia, EF 67%, low               risk scan No date: Hx of seizure disorder     Comment:  a. one episode 10 months after CVA, since then on Keppra No date: Hypothyroidism 2019: Personal history of radiation therapy     Comment:  LEFT lumpectomy No date: Seizures ([place]) 2012: Stroke ([place]) No date: Thyroid disease   Reproductive/Obstetrics                             Anesthesia Physical Anesthesia Plan  ASA: III  Anesthesia Plan: General   Post-op Pain Management:    Induction: Intravenous  PONV Risk Score and Plan: 2 and Ondansetron, Dexamethasone and Midazolam  Airway Management Planned: Oral ETT  Additional Equipment:   Intra-op Plan:   Post-operative Plan: Extubation in OR  Informed Consent: I have reviewed the patients History and Physical, chart, labs and discussed the procedure including the risks, benefits and alternatives for the proposed anesthesia with the patient or authorized representative who has indicated his/her understanding and acceptance.   Dental advisory given  Plan Discussed with: CRNA and Anesthesiologist  Anesthesia Plan Comments:         Anesthesia Quick Evaluation

## 2017-12-16 DIAGNOSIS — K115 Sialolithiasis: Secondary | ICD-10-CM | POA: Diagnosis not present

## 2017-12-16 LAB — SURGICAL PATHOLOGY

## 2017-12-16 MED ORDER — ONDANSETRON HCL 4 MG PO TABS: 4.0000 mg | ORAL_TABLET | ORAL | 0 refills | Status: DC | PRN

## 2017-12-16 MED ORDER — HYDROCODONE-ACETAMINOPHEN 5-325 MG PO TABS: 1.0000 | ORAL_TABLET | ORAL | 0 refills | Status: DC | PRN

## 2017-12-16 NOTE — Progress Notes (Signed)
Discharge order received. Patient is alert and oriented. Vital signs stable . No signs of acute distress. Discharge instructions given. Patient verbalized understanding. No other issues noted at this time.   

## 2017-12-16 NOTE — Final Progress Note (Signed)
..   12/16/2017 7:46 AM  [name] [phone]  Post-Op Day 1    Temp:  [97.3 F (36.3 C)-98.9 F (37.2 C)] 98.9 F (37.2 C) (11/08 0745) Pulse Rate:  [73-113] 73 (11/08 0745) Resp:  [13-20] 19 (11/08 0745) BP: (102-133)/(66-86) 116/77 (11/08 0745) SpO2:  [93 %-99 %] 99 % (11/08 0745),     Intake/Output Summary (Last 24 hours) at 12/16/2017 0746 Last data filed at 12/16/2017 0529 Gross per 24 hour  Intake 840 ml  Output 340 ml  Net 500 ml    Results for orders placed or performed during the hospital encounter of 12/15/17 (from the past 24 hour(s))  Glucose, capillary     Status: Abnormal   Collection Time: 12/15/17  9:17 AM  Result Value Ref Range   Glucose-Capillary 163 (H) 70 - 99 mg/dL    SUBJECTIVE:  No acute events.  Tolerating PO.  Ambulating.  OBJECTIVE:  GEN-  NAD NECK-  Right incision c/d/i, drain removed  IMPRESSION:  S/p right submandibular gland excision  PLAN:  D/c home.  Follow up next Thursday.  Melissa Day 12/16/2017, 7:46 AM

## 2017-12-16 NOTE — Care Management Obs Status (Signed)
[facility] NOTIFICATION   Patient Details  Name: LARONDA LISBY MRN: [id number] Date of Birth: [birth year]   Medicare Observation Status Notification Given:  No(admitted obs less thand 24 hours)    Beverly Sessions, RN 12/16/2017, 10:01 AM

## 2017-12-18 ENCOUNTER — Encounter: Payer: Self-pay | Admitting: Otolaryngology

## 2018-02-21 ENCOUNTER — Inpatient Hospital Stay: Payer: Medicare Other | Attending: Internal Medicine | Admitting: Internal Medicine

## 2018-02-21 ENCOUNTER — Encounter: Payer: Self-pay | Admitting: Internal Medicine

## 2018-02-21 VITALS — BP 134/84 | HR 87 | Temp 97.2°F | Resp 16 | Wt 213.6 lb

## 2018-02-21 DIAGNOSIS — Z7982 Long term (current) use of aspirin: Secondary | ICD-10-CM | POA: Insufficient documentation

## 2018-02-21 DIAGNOSIS — R531 Weakness: Secondary | ICD-10-CM

## 2018-02-21 DIAGNOSIS — R911 Solitary pulmonary nodule: Secondary | ICD-10-CM | POA: Diagnosis not present

## 2018-02-21 DIAGNOSIS — K115 Sialolithiasis: Secondary | ICD-10-CM | POA: Diagnosis not present

## 2018-02-21 DIAGNOSIS — Z87891 Personal history of nicotine dependence: Secondary | ICD-10-CM | POA: Diagnosis not present

## 2018-02-21 DIAGNOSIS — Z170 Estrogen receptor positive status [ER+]: Secondary | ICD-10-CM | POA: Insufficient documentation

## 2018-02-21 DIAGNOSIS — D0512 Intraductal carcinoma in situ of left breast: Secondary | ICD-10-CM | POA: Diagnosis not present

## 2018-02-21 DIAGNOSIS — Z8673 Personal history of transient ischemic attack (TIA), and cerebral infarction without residual deficits: Secondary | ICD-10-CM | POA: Diagnosis not present

## 2018-02-21 NOTE — Assessment & Plan Note (Addendum)
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 Grade 3; ER PR positive.  S/p RT [finished on  3/15].  Patient not on tamoxifen/history of stroke.  #Clinically no evidence of recurrence/progression.  Mammogram October 2019 within normal limits.  #History of stroke chronic left-sided weakness-stable continue aspirin.  # RUL nodule- 88mm; [oct 2019];[Hx of smoking] again scan in feb 2020/Dr.Crystal.   #Salivary gland stone status post excision Dr. Pryor Ochoa- stable.  # DISPOSITION:  # follow up in mid-October 2020 months/ No labs; Mammogram prior- Dr.B

## 2018-02-21 NOTE — Progress Notes (Signed)
[facility] OFFICE PROGRESS NOTE  Patient Care Team: Tracie Harrier, MD as PCP - General (Internal Medicine)  Cancer Staging No matching staging information was found for the patient.   Oncology History   #NOV 2018 LEFT BREAST DCIS [s/p Lumpec; Dr.Smith]; 81mm;  G-3; margins- NEG; closest- posterior margin-74mm;STAGE- pTis pNx ; ER/PR >90% s/p RT [Dr.Crystal;march 15th 2019 ];   # NO primary prophylaxis/Tamoxifen sec to stroke  # hx of CVA [2012; [facility]-right sided deficits/ mild aphasia; [facility]]. Salivary gland stone [Dr.Vaught]  # oct 2019- RUL nodule [incidental] 33mm  # GENETICS- NEGATIVE [Ofir; [facility]]  DIAGNOSIS: DCIS  STAGE:  0       ;GOALS: cure CURRENT/MOST RECENT THERAPY: surveillaince        Ductal carcinoma in situ (DCIS) of left breast      INTERVAL HISTORY:  Melissa Day 48 y.o.  female pleasant patient above history of DCIS status post lumpectomy followed by radiation is here for follow-up.  Patient is on not on tamoxifen because of history of stroke.  Patient interim underwent submandibular gland excision because of salivary gland stone with ENT.  Patient is also waiting to get a repeat CT scan in February/because of an incidental right upper nodule approximately 6 mm [on the CT scan October 2019] patient has previous history of smoking.  She denies any lumps or bumps.  No new signs or symptoms of strokes.  Denies any shortness of breath or cough.  Review of Systems  Constitutional: Negative for chills, diaphoresis, fever, malaise/fatigue and weight loss.  HENT: Negative for nosebleeds and sore throat.   Eyes: Negative for double vision.  Respiratory: Negative for cough, hemoptysis, sputum production, shortness of breath and wheezing.   Cardiovascular: Negative for chest pain, palpitations, orthopnea and leg swelling.  Gastrointestinal: Negative for abdominal pain, blood in stool, constipation, diarrhea, heartburn, melena, nausea and vomiting.   Genitourinary: Negative for dysuria, frequency and urgency.  Musculoskeletal: Negative for back pain and joint pain.  Skin: Negative.  Negative for itching and rash.  Neurological: Positive for focal weakness (chronic). Negative for dizziness, tingling, weakness and headaches.  Endo/Heme/Allergies: Does not bruise/bleed easily.  Psychiatric/Behavioral: Negative for depression. The patient is not nervous/anxious and does not have insomnia.       PAST MEDICAL HISTORY :  Past Medical History:  Diagnosis Date  . Anemia   . Cancer ([place]) 2018   br cancer on left with lump and rad tx   . Diabetes mellitus without complication ([place])   . Family history of colonic polyps   . H/O: CVA (cerebrovascular accident) 2012   a. cerebral edema, craniotomy, residual right upper & lower limb weakness  . Heart murmur   . High cholesterol   . History of colon polyps   . History of echocardiogram    a. 12/01/2013: EF 50-55%, nl global LV systolic function, mild MR, mildly increased LV posterior wall thickness  . History of kidney stones   . History of stress test    a. 12/01/2013: no significant ischemia, no significant WMA, no EKG changes concerning for ischemia, EF 67%, low risk scan  . Hx of seizure disorder    a. one episode 10 months after CVA, since then on Keppra   . Hypothyroidism   . Personal history of radiation therapy 2019   LEFT lumpectomy  . Seizures ([place])   . Stroke ([place]) 2012  . Thyroid disease     PAST SURGICAL HISTORY :   Past Surgical History:  Procedure Laterality Date  . BRAIN SURGERY     decompression after stroke  . BREAST BIOPSY Left 12/13/2016   path pending  . BREAST LUMPECTOMY Left 01/06/2017   DUCTAL CARCINOMA IN SITU, NUCLEAR GRADE 3.  . BREAST LUMPECTOMY WITH NEEDLE LOCALIZATION Left 01/06/2017   Procedure: BREAST LUMPECTOMY WITH NEEDLE LOCALIZATION;  Surgeon: Leonie Green, MD;  Location: [facility] ORS;  Service: General;  Laterality: Left;  . COLONOSCOPY  WITH PROPOFOL N/A 11/18/2014   Procedure: COLONOSCOPY WITH PROPOFOL;  Surgeon: Josefine Class, MD;  Location: [facility];  Service: Endoscopy;  Laterality: N/A;  . CYSTOSCOPY/RETROGRADE/URETEROSCOPY/STONE EXTRACTION WITH BASKET Right 10/29/2014   Procedure: CYSTOSCOPY/URETEROSCOPY/STONE EXTRACTION WITH BASKET;  Surgeon: Franchot Gallo, MD;  Location: [facility] ORS;  Service: Urology;  Laterality: Right;  . ESOPHAGOGASTRODUODENOSCOPY (EGD) WITH PROPOFOL N/A 11/18/2014   Procedure: ESOPHAGOGASTRODUODENOSCOPY (EGD) WITH PROPOFOL;  Surgeon: Josefine Class, MD;  Location: [facility] ENDOSCOPY;  Service: Endoscopy;  Laterality: N/A;  . RE-EXCISION OF BREAST LUMPECTOMY Left 01/25/2017   Procedure: RE-EXCISION OF BREAST LUMPECTOMY;  Surgeon: Leonie Green, MD;  Location: [facility] ORS;  Service: General;  Laterality: Left;  . SUBMANDIBULAR GLAND EXCISION Right 12/15/2017   Procedure: EXCISION SUBMANDIBULAR GLAND;  Surgeon: Carloyn Manner, MD;  Location: [facility] ORS;  Service: ENT;  Laterality: Right;  . TONSILLECTOMY      FAMILY HISTORY :   Family History  Problem Relation Age of Onset  . Nephrolithiasis Mother   . Non-Hodgkin's lymphoma Mother 73       currently 36  . Heart disease Father   . CVA Father   . Nephrolithiasis Maternal Grandfather   . Diabetes Maternal Grandfather   . Pancreatic cancer Maternal Grandfather 44       deceased 33  . Breast cancer Sister 38       negative genetic testing; currently 20  . Lymphoma Maternal Grandmother        deceased 28  . Breast cancer Paternal Aunt        4 more paternal aunts with breast cancer dx 60s-70s  . Breast cancer Cousin        2 paternal cousins; daughters of pat aunt w/ BC at 61  . Colon cancer Cousin 44       son of mat aunt  . Breast cancer Paternal Aunt 8       deceased 61 of MI    SOCIAL HISTORY:   Social History   Tobacco Use  . Smoking status: Former Smoker    Packs/day: 1.00    Years: 15.00    Pack years:  15.00    Types: Cigarettes    Last attempt to quit: 12/30/2011    Years since quitting: 6.1  . Smokeless tobacco: Never Used  Substance Use Topics  . Alcohol use: No    Frequency: Never  . Drug use: No    ALLERGIES:  has No Known Allergies.  MEDICATIONS:  Current Outpatient Medications  Medication Sig Dispense Refill  . aspirin EC 81 MG tablet Take 81 mg by mouth daily.    Marland Kitchen atorvastatin (LIPITOR) 40 MG tablet Take 1 tablet (40 mg total) by mouth daily. 30 tablet 3  . FLUoxetine (PROZAC) 40 MG capsule Take 40 mg by mouth daily.    Marland Kitchen HYDROcodone-acetaminophen (NORCO) 5-325 MG tablet Take 1 tablet by mouth every 4 (four) hours as needed for moderate pain. 20 tablet 0  . levETIRAcetam (KEPPRA) 750 MG tablet Take 1 tablet (750 mg total) by mouth 2 (two)  times daily. 28 tablet 0  . levothyroxine (SYNTHROID, LEVOTHROID) 137 MCG tablet Take 137 mcg daily before breakfast by mouth.     . metFORMIN (GLUCOPHAGE) 500 MG tablet Take 500 mg by mouth daily with breakfast.    . ondansetron (ZOFRAN) 4 MG tablet Take 1 tablet (4 mg total) by mouth every 4 (four) hours as needed for nausea. 20 tablet 0   No current facility-administered medications for this visit.     PHYSICAL EXAMINATION: ECOG PERFORMANCE STATUS: 0 - Asymptomatic  BP 134/84 (BP Location: Left Arm, Patient Position: Sitting, Cuff Size: Normal)   Pulse 87   Temp (!) 97.2 F (36.2 C) (Tympanic)   Resp 16   Wt 213 lb 10 oz (96.9 kg)   BMI 37.84 kg/m   Filed Weights   02/21/18 1429  Weight: 213 lb 10 oz (96.9 kg)    Physical Exam  Constitutional: She is oriented to person, place, and time and well-developed, well-nourished, and in no distress.  Obese.  Accompanied by her mother.  HENT:  Head: Normocephalic and atraumatic.  Mouth/Throat: Oropharynx is clear and moist. No oropharyngeal exudate.  Eyes: Pupils are equal, round, and reactive to light.  Neck: Normal range of motion. Neck supple.  Cardiovascular: Normal rate  and regular rhythm.  Pulmonary/Chest: No respiratory distress. She has no wheezes.  Abdominal: Soft. Bowel sounds are normal. She exhibits no distension and no mass. There is no abdominal tenderness. There is no rebound and no guarding.  Musculoskeletal: Normal range of motion.        General: No tenderness or edema.  Neurological: She is alert and oriented to person, place, and time.  Chronic right-sided weakness/prior history of stroke.  Skin: Skin is warm.  Psychiatric: Affect normal.        LABORATORY DATA:  I have reviewed the data as listed    Component Value Date/Time   NA 136 12/08/2017 0829   NA 141 12/01/2013 0436   K 3.8 12/08/2017 0829   K 3.9 12/01/2013 0436   CL 102 12/08/2017 0829   CL 105 12/01/2013 0436   CO2 25 12/08/2017 0829   CO2 27 12/01/2013 0436   GLUCOSE 132 (H) 12/08/2017 0829   GLUCOSE 109 (H) 12/01/2013 0436   BUN 9 12/08/2017 0829   BUN 8 12/01/2013 0436   CREATININE 0.56 12/08/2017 0829   CREATININE 0.81 12/01/2013 0436   CALCIUM 8.4 (L) 12/08/2017 0829   CALCIUM 8.4 (L) 12/01/2013 0436   PROT 7.3 07/29/2017 0721   PROT 6.4 01/24/2014 1418   PROT 6.8 11/30/2013 0834   ALBUMIN 4.2 07/29/2017 0721   ALBUMIN 4.3 01/24/2014 1418   ALBUMIN 3.6 11/30/2013 0834   AST 17 07/29/2017 0721   AST 12 (L) 11/30/2013 0834   ALT 22 07/29/2017 0721   ALT 26 11/30/2013 0834   ALKPHOS 84 07/29/2017 0721   ALKPHOS 74 11/30/2013 0834   BILITOT 0.6 07/29/2017 0721   BILITOT 0.4 11/30/2013 0834   GFRNONAA >60 12/08/2017 0829   GFRNONAA >60 12/01/2013 0436   GFRNONAA >60 09/30/2011 1332   GFRAA >60 12/08/2017 0829   GFRAA >60 12/01/2013 0436   GFRAA >60 09/30/2011 1332    No results found for: SPEP, UPEP  Lab Results  Component Value Date   WBC 10.4 07/29/2017   NEUTROABS 6.7 (H) 07/29/2017   HGB 14.7 07/29/2017   HCT 43.1 07/29/2017   MCV 88.5 07/29/2017   PLT 286 07/29/2017      Chemistry  Component Value Date/Time   NA 136  12/08/2017 0829   NA 141 12/01/2013 0436   K 3.8 12/08/2017 0829   K 3.9 12/01/2013 0436   CL 102 12/08/2017 0829   CL 105 12/01/2013 0436   CO2 25 12/08/2017 0829   CO2 27 12/01/2013 0436   BUN 9 12/08/2017 0829   BUN 8 12/01/2013 0436   CREATININE 0.56 12/08/2017 0829   CREATININE 0.81 12/01/2013 0436      Component Value Date/Time   CALCIUM 8.4 (L) 12/08/2017 0829   CALCIUM 8.4 (L) 12/01/2013 0436   ALKPHOS 84 07/29/2017 0721   ALKPHOS 74 11/30/2013 0834   AST 17 07/29/2017 0721   AST 12 (L) 11/30/2013 0834   ALT 22 07/29/2017 0721   ALT 26 11/30/2013 0834   BILITOT 0.6 07/29/2017 0721   BILITOT 0.4 11/30/2013 0834       RADIOGRAPHIC STUDIES: I have personally reviewed the radiological images as listed and agreed with the findings in the report. No results found.   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 Grade 3; ER PR positive.  S/p RT [finished on  3/15].  Patient not on tamoxifen/history of stroke.  #Clinically no evidence of recurrence/progression.  Mammogram October 2019 within normal limits.  #History of stroke chronic left-sided weakness-stable continue aspirin.  # RUL nodule- 42mm; [oct 2019];[Hx of smoking] again scan in feb 2020/Dr.Crystal.   #Salivary gland stone status post excision Dr. Pryor Ochoa- stable.  # DISPOSITION:  # follow up in mid-October 2020 months/ No labs; Mammogram prior- Dr.B   Orders Placed This Encounter  Procedures  . MM DIAG BREAST TOMO BILATERAL    Standing Status:   Future    Standing Expiration Date:   02/22/2019    Order Specific Question:   Reason for Exam (SYMPTOM  OR DIAGNOSIS REQUIRED)    Answer:   breast cancer    Order Specific Question:   Is the patient pregnant?    Answer:   No    Order Specific Question:   Preferred imaging location?    Answer:   [facility]   All questions were answered. The patient knows to call the clinic with any  problems, questions or concerns.      Cammie Sickle, MD 02/21/2018 3:22 PM

## 2018-03-09 DIAGNOSIS — E1165 Type 2 diabetes mellitus with hyperglycemia: Secondary | ICD-10-CM | POA: Insufficient documentation

## 2018-03-09 DIAGNOSIS — F3341 Major depressive disorder, recurrent, in partial remission: Secondary | ICD-10-CM | POA: Insufficient documentation

## 2018-03-27 ENCOUNTER — Ambulatory Visit
Admission: RE | Admit: 2018-03-27 | Discharge: 2018-03-27 | Disposition: A | Discharge status: Home / Self Care | Payer: Medicare Other | Source: Ambulatory Visit | Attending: Radiation Oncology | Admitting: Radiation Oncology

## 2018-03-27 DIAGNOSIS — R911 Solitary pulmonary nodule: Secondary | ICD-10-CM | POA: Insufficient documentation

## 2018-03-27 MED ORDER — IOHEXOL 300 MG/ML  SOLN
75.0000 mL | Freq: Once | INTRAMUSCULAR | Status: AC | PRN
  Administered 2018-03-27: 75 mL via INTRAVENOUS

## 2018-04-06 ENCOUNTER — Encounter: Payer: Self-pay | Admitting: Radiation Oncology

## 2018-04-06 ENCOUNTER — Ambulatory Visit
Admission: RE | Admit: 2018-04-06 | Discharge: 2018-04-06 | Disposition: A | Discharge status: Home / Self Care | Payer: Medicare Other | Source: Ambulatory Visit | Attending: Radiation Oncology | Admitting: Radiation Oncology

## 2018-04-06 ENCOUNTER — Other Ambulatory Visit: Payer: Self-pay

## 2018-04-06 VITALS — BP 132/92 | HR 83 | Temp 97.8°F | Resp 18 | Wt 213.1 lb

## 2018-04-06 DIAGNOSIS — R918 Other nonspecific abnormal finding of lung field: Secondary | ICD-10-CM | POA: Diagnosis not present

## 2018-04-06 DIAGNOSIS — Z923 Personal history of irradiation: Secondary | ICD-10-CM | POA: Insufficient documentation

## 2018-04-06 DIAGNOSIS — Z8673 Personal history of transient ischemic attack (TIA), and cerebral infarction without residual deficits: Secondary | ICD-10-CM | POA: Insufficient documentation

## 2018-04-06 DIAGNOSIS — C50511 Malignant neoplasm of lower-outer quadrant of right female breast: Secondary | ICD-10-CM

## 2018-04-06 DIAGNOSIS — Z86000 Personal history of in-situ neoplasm of breast: Secondary | ICD-10-CM | POA: Insufficient documentation

## 2018-04-06 NOTE — Progress Notes (Signed)
Radiation Oncology Follow up Note  Name: Melissa Day   Date:   04/06/2018 MRN:  [id number] DOB: 03-27-1970    This 48 y.o. female presents to the clinic today for en-month follow-up status post whole breast radiation to her left breast for ductal carcinoma in situ.  REFERRING PROVIDER: Tracie Harrier, MD  HPI: patient is a 48 year old female now seen out 10 months having completed radiation therapy to her left breast for ductal carcinoma in situ. She has a history of CVA is not on anti-estrogen therapy she is seen today in routine follow-up and is doing well specifically denies breast tenderness cough or bone pain.Marland Kitchenwe are also following a right upper lobe lung nodule measuring 6 mm which is not changed in size over the past 5 months with advised to repeat her scan in 1 year. She's not yet had diagnostic mammograms.  COMPLICATIONS OF TREATMENT: none  FOLLOW UP COMPLIANCE: keeps appointments   PHYSICAL EXAM:  BP (!) 132/92   Pulse 83   Temp 97.8 F (36.6 C)   Resp 18   Wt 213 lb 1.2 oz (96.6 kg)   BMI 37.74 kg/m  Lungs are clear to A&P cardiac examination essentially unremarkable with regular rate and rhythm. No dominant mass or nodularity is noted in either breast in 2 positions examined. Incision is well-healed. No axillary or supraclavicular adenopathy is appreciated. Cosmetic result is excellent.Well-developed well-nourished patient in NAD. HEENT reveals PERLA, EOMI, discs not visualized.  Oral cavity is clear. No oral mucosal lesions are identified. Neck is clear without evidence of cervical or supraclavicular adenopathy. Lungs are clear to A&P. Cardiac examination is essentially unremarkable with regular rate and rhythm without murmur rub or thrill. Abdomen is benign with no organomegaly or masses noted. Motor sensory and DTR levels are equal and symmetric in the upper and lower extremities. Cranial nerves II through XII are grossly intact. Proprioception is intact. No peripheral  adenopathy or edema is identified. No motor or sensory levels are noted. Crude visual fields are within normal range.  RADIOLOGY RESULTS: CT scan is of her chest is reviewed and compatible above-stated findings diagnosticmammograms have been ordered  PLAN: present time patient is doing well with no evidence of disease. I'm pleased her lung nodule did not increase in size and will repeat her CT scans 1 year. I've ordered bilateral diagnostic mammograms the next 2 months so Dr. Ezzie Dural will have them to review when he sees her in follow-up in October. I've asked to see her back in 1 year for follow-up. Patient comprehend my treatment plan well.  I would like to take this opportunity to thank you for allowing me to participate in the care of your patient.Noreene Filbert, MD

## 2018-11-22 ENCOUNTER — Inpatient Hospital Stay: Payer: Medicare Other | Attending: Internal Medicine | Admitting: Internal Medicine

## 2018-11-22 NOTE — Progress Notes (Deleted)
[place] OFFICE PROGRESS NOTE  Patient Care Team: Tracie Harrier, MD as PCP - General (Internal Medicine)  Cancer Staging No matching staging information was found for the patient.   Oncology History Overview Note  #NOV 2018 LEFT BREAST DCIS [s/p Lumpec; Dr.Smith]; 60mm;  G-3; margins- NEG; closest- posterior margin-86mm;STAGE- pTis pNx ; ER/PR >90% s/p RT [Dr.Crystal;march 15th 2019 ];   # NO primary prophylaxis/Tamoxifen sec to stroke  # hx of CVA [2012; [facility]-right sided deficits/ mild aphasia; [facility]]. Salivary gland stone [Dr.Vaught]  # oct 2019- RUL nodule [incidental] 73mm  # GENETICS- NEGATIVE [Ofir; [facility]]  DIAGNOSIS: DCIS  STAGE:  0       ;GOALS: cure CURRENT/MOST RECENT THERAPY: surveillaince      Ductal carcinoma in situ (DCIS) of left breast      INTERVAL HISTORY:  Melissa Day 47 y.o.  female pleasant patient above history of DCIS status post lumpectomy followed by radiation is here for follow-up.  Patient is on not on tamoxifen because of history of stroke.  Patient interim underwent submandibular gland excision because of salivary gland stone with ENT.  Patient is also waiting to get a repeat CT scan in February/because of an incidental right upper nodule approximately 6 mm [on the CT scan October 2019] patient has previous history of smoking.  She denies any lumps or bumps.  No new signs or symptoms of strokes.  Denies any shortness of breath or cough.  Review of Systems  Constitutional: Negative for chills, diaphoresis, fever, malaise/fatigue and weight loss.  HENT: Negative for nosebleeds and sore throat.   Eyes: Negative for double vision.  Respiratory: Negative for cough, hemoptysis, sputum production, shortness of breath and wheezing.   Cardiovascular: Negative for chest pain, palpitations, orthopnea and leg swelling.  Gastrointestinal: Negative for abdominal pain, blood in stool, constipation, diarrhea, heartburn, melena, nausea and  vomiting.  Genitourinary: Negative for dysuria, frequency and urgency.  Musculoskeletal: Negative for back pain and joint pain.  Skin: Negative.  Negative for itching and rash.  Neurological: Positive for focal weakness (chronic). Negative for dizziness, tingling, weakness and headaches.  Endo/Heme/Allergies: Does not bruise/bleed easily.  Psychiatric/Behavioral: Negative for depression. The patient is not nervous/anxious and does not have insomnia.       PAST MEDICAL HISTORY :  Past Medical History:  Diagnosis Date  . Anemia   . Cancer ([place]) 2018   br cancer on left with lump and rad tx   . Diabetes mellitus without complication ([place])   . Family history of colonic polyps   . H/O: CVA (cerebrovascular accident) 2012   a. cerebral edema, craniotomy, residual right upper & lower limb weakness  . Heart murmur   . High cholesterol   . History of colon polyps   . History of echocardiogram    a. 12/01/2013: EF 50-55%, nl global LV systolic function, mild MR, mildly increased LV posterior wall thickness  . History of kidney stones   . History of stress test    a. 12/01/2013: no significant ischemia, no significant WMA, no EKG changes concerning for ischemia, EF 67%, low risk scan  . Hx of seizure disorder    a. one episode 10 months after CVA, since then on Keppra   . Hypothyroidism   . Personal history of radiation therapy 2019   LEFT lumpectomy  . Seizures ([place])   . Stroke ([place]) 2012  . Thyroid disease     PAST SURGICAL HISTORY :   Past Surgical History:  Procedure Laterality Date  . BRAIN SURGERY     decompression after stroke  . BREAST BIOPSY Left 12/13/2016   path pending  . BREAST LUMPECTOMY Left 01/06/2017   DUCTAL CARCINOMA IN SITU, NUCLEAR GRADE 3.  . BREAST LUMPECTOMY WITH NEEDLE LOCALIZATION Left 01/06/2017   Procedure: BREAST LUMPECTOMY WITH NEEDLE LOCALIZATION;  Surgeon: Leonie Green, MD;  Location: [facility] ORS;  Service: General;  Laterality: Left;  .  COLONOSCOPY WITH PROPOFOL N/A 11/18/2014   Procedure: COLONOSCOPY WITH PROPOFOL;  Surgeon: Josefine Class, MD;  Location: [facility] ENDOSCOPY;  Service: Endoscopy;  Laterality: N/A;  . CYSTOSCOPY/RETROGRADE/URETEROSCOPY/STONE EXTRACTION WITH BASKET Right 10/29/2014   Procedure: CYSTOSCOPY/URETEROSCOPY/STONE EXTRACTION WITH BASKET;  Surgeon: Franchot Gallo, MD;  Location: [facility] ORS;  Service: Urology;  Laterality: Right;  . ESOPHAGOGASTRODUODENOSCOPY (EGD) WITH PROPOFOL N/A 11/18/2014   Procedure: ESOPHAGOGASTRODUODENOSCOPY (EGD) WITH PROPOFOL;  Surgeon: Josefine Class, MD;  Location: [facility] ENDOSCOPY;  Service: Endoscopy;  Laterality: N/A;  . RE-EXCISION OF BREAST LUMPECTOMY Left 01/25/2017   Procedure: RE-EXCISION OF BREAST LUMPECTOMY;  Surgeon: Leonie Green, MD;  Location: [facility] ORS;  Service: General;  Laterality: Left;  . SUBMANDIBULAR GLAND EXCISION Right 12/15/2017   Procedure: EXCISION SUBMANDIBULAR GLAND;  Surgeon: Carloyn Manner, MD;  Location: [facility] ORS;  Service: ENT;  Laterality: Right;  . TONSILLECTOMY      FAMILY HISTORY :   Family History  Problem Relation Age of Onset  . Nephrolithiasis Mother   . Non-Hodgkin's lymphoma Mother 63       currently 84  . Heart disease Father   . CVA Father   . Nephrolithiasis Maternal Grandfather   . Diabetes Maternal Grandfather   . Pancreatic cancer Maternal Grandfather 64       deceased 10  . Breast cancer Sister 68       negative genetic testing; currently 1  . Lymphoma Maternal Grandmother        deceased 33  . Breast cancer Paternal Aunt        4 more paternal aunts with breast cancer dx 60s-70s  . Breast cancer Cousin        2 paternal cousins; daughters of pat aunt w/ BC at 27  . Colon cancer Cousin 66       son of mat aunt  . Breast cancer Paternal Aunt 21       deceased 46 of MI    SOCIAL HISTORY:   Social History   Tobacco Use  . Smoking status: Former Smoker    Packs/day: 1.00    Years: 15.00    Pack  years: 15.00    Types: Cigarettes    Quit date: 12/30/2011    Years since quitting: 6.9  . Smokeless tobacco: Never Used  Substance Use Topics  . Alcohol use: No    Frequency: Never  . Drug use: No    ALLERGIES:  has No Known Allergies.  MEDICATIONS:  Current Outpatient Medications  Medication Sig Dispense Refill  . aspirin EC 81 MG tablet Take 81 mg by mouth daily.    Marland Kitchen atorvastatin (LIPITOR) 40 MG tablet Take 1 tablet (40 mg total) by mouth daily. 30 tablet 3  . FLUoxetine (PROZAC) 40 MG capsule Take 40 mg by mouth daily.    Marland Kitchen HYDROcodone-acetaminophen (NORCO) 5-325 MG tablet Take 1 tablet by mouth every 4 (four) hours as needed for moderate pain. 20 tablet 0  . levETIRAcetam (KEPPRA) 750 MG tablet Take 1 tablet (750 mg total) by mouth 2 (two) times daily.  28 tablet 0  . levothyroxine (SYNTHROID, LEVOTHROID) 137 MCG tablet Take 137 mcg daily before breakfast by mouth.     . metFORMIN (GLUCOPHAGE) 500 MG tablet Take 500 mg by mouth daily with breakfast.    . ondansetron (ZOFRAN) 4 MG tablet Take 1 tablet (4 mg total) by mouth every 4 (four) hours as needed for nausea. 20 tablet 0   No current facility-administered medications for this visit.     PHYSICAL EXAMINATION: ECOG PERFORMANCE STATUS: 0 - Asymptomatic  There were no vitals taken for this visit.  There were no vitals filed for this visit.  Physical Exam  Constitutional: She is oriented to person, place, and time and well-developed, well-nourished, and in no distress.  Obese.  Accompanied by her mother.  HENT:  Head: Normocephalic and atraumatic.  Mouth/Throat: Oropharynx is clear and moist. No oropharyngeal exudate.  Eyes: Pupils are equal, round, and reactive to light.  Neck: Normal range of motion. Neck supple.  Cardiovascular: Normal rate and regular rhythm.  Pulmonary/Chest: No respiratory distress. She has no wheezes.  Abdominal: Soft. Bowel sounds are normal. She exhibits no distension and no mass. There is  no abdominal tenderness. There is no rebound and no guarding.  Musculoskeletal: Normal range of motion.        General: No tenderness or edema.  Neurological: She is alert and oriented to person, place, and time.  Chronic right-sided weakness/prior history of stroke.  Skin: Skin is warm.  Psychiatric: Affect normal.        LABORATORY DATA:  I have reviewed the data as listed    Component Value Date/Time   NA 136 12/08/2017 0829   NA 141 12/01/2013 0436   K 3.8 12/08/2017 0829   K 3.9 12/01/2013 0436   CL 102 12/08/2017 0829   CL 105 12/01/2013 0436   CO2 25 12/08/2017 0829   CO2 27 12/01/2013 0436   GLUCOSE 132 (H) 12/08/2017 0829   GLUCOSE 109 (H) 12/01/2013 0436   BUN 9 12/08/2017 0829   BUN 8 12/01/2013 0436   CREATININE 0.56 12/08/2017 0829   CREATININE 0.81 12/01/2013 0436   CALCIUM 8.4 (L) 12/08/2017 0829   CALCIUM 8.4 (L) 12/01/2013 0436   PROT 7.3 07/29/2017 0721   PROT 6.4 01/24/2014 1418   PROT 6.8 11/30/2013 0834   ALBUMIN 4.2 07/29/2017 0721   ALBUMIN 4.3 01/24/2014 1418   ALBUMIN 3.6 11/30/2013 0834   AST 17 07/29/2017 0721   AST 12 (L) 11/30/2013 0834   ALT 22 07/29/2017 0721   ALT 26 11/30/2013 0834   ALKPHOS 84 07/29/2017 0721   ALKPHOS 74 11/30/2013 0834   BILITOT 0.6 07/29/2017 0721   BILITOT 0.4 11/30/2013 0834   GFRNONAA >60 12/08/2017 0829   GFRNONAA >60 12/01/2013 0436   GFRNONAA >60 09/30/2011 1332   GFRAA >60 12/08/2017 0829   GFRAA >60 12/01/2013 0436   GFRAA >60 09/30/2011 1332    No results found for: SPEP, UPEP  Lab Results  Component Value Date   WBC 10.4 07/29/2017   NEUTROABS 6.7 (H) 07/29/2017   HGB 14.7 07/29/2017   HCT 43.1 07/29/2017   MCV 88.5 07/29/2017   PLT 286 07/29/2017      Chemistry      Component Value Date/Time   NA 136 12/08/2017 0829   NA 141 12/01/2013 0436   K 3.8 12/08/2017 0829   K 3.9 12/01/2013 0436   CL 102 12/08/2017 0829   CL 105 12/01/2013 0436   CO2 25  12/08/2017 0829   CO2 27  12/01/2013 0436   BUN 9 12/08/2017 0829   BUN 8 12/01/2013 0436   CREATININE 0.56 12/08/2017 0829   CREATININE 0.81 12/01/2013 0436      Component Value Date/Time   CALCIUM 8.4 (L) 12/08/2017 0829   CALCIUM 8.4 (L) 12/01/2013 0436   ALKPHOS 84 07/29/2017 0721   ALKPHOS 74 11/30/2013 0834   AST 17 07/29/2017 0721   AST 12 (L) 11/30/2013 0834   ALT 22 07/29/2017 0721   ALT 26 11/30/2013 0834   BILITOT 0.6 07/29/2017 0721   BILITOT 0.4 11/30/2013 0834       RADIOGRAPHIC STUDIES: I have personally reviewed the radiological images as listed and agreed with the findings in the report. No results found.   ASSESSMENT & PLAN:  No problem-specific Assessment & Plan notes found for this encounter.   No orders of the defined types were placed in this encounter.  All questions were answered. The patient knows to call the clinic with any problems, questions or concerns.      Cammie Sickle, MD 11/22/2018 7:43 AM

## 2018-11-22 NOTE — Assessment & Plan Note (Deleted)
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 Grade 3; ER PR positive.  S/p RT [finished on  3/15].  Patient not on tamoxifen/history of stroke.  #Clinically no evidence of recurrence/progression.  Mammogram October 2019 within normal limits.  #History of stroke chronic left-sided weakness-stable continue aspirin.  # RUL nodule- 63mm; [oct 2019];[Hx of smoking] again scan in feb 2020/Dr.Crystal.   #Salivary gland stone status post excision Dr. Pryor Ochoa- stable.  # DISPOSITION:  # follow up in mid-October 2020 months/ No labs; Mammogram prior- Dr.B

## 2018-11-23 ENCOUNTER — Ambulatory Visit
Admission: RE | Admit: 2018-11-23 | Discharge: 2018-11-23 | Disposition: A | Discharge status: Home / Self Care | Payer: Medicare Other | Source: Ambulatory Visit | Attending: Internal Medicine | Admitting: Internal Medicine

## 2018-11-23 DIAGNOSIS — D0512 Intraductal carcinoma in situ of left breast: Secondary | ICD-10-CM | POA: Diagnosis not present

## 2019-04-12 ENCOUNTER — Other Ambulatory Visit: Payer: Self-pay

## 2019-04-12 ENCOUNTER — Encounter: Payer: Self-pay | Admitting: Radiation Oncology

## 2019-04-13 ENCOUNTER — Ambulatory Visit
Admission: RE | Admit: 2019-04-13 | Discharge: 2019-04-13 | Disposition: A | Discharge status: Home / Self Care | Payer: Medicare Other | Source: Ambulatory Visit | Attending: Radiation Oncology | Admitting: Radiation Oncology

## 2019-04-13 ENCOUNTER — Other Ambulatory Visit: Payer: Self-pay

## 2019-04-13 ENCOUNTER — Ambulatory Visit: Payer: Medicare Other | Admitting: Radiation Oncology

## 2019-04-13 VITALS — BP 130/82 | HR 98 | Temp 96.1°F | Resp 16 | Wt 213.2 lb

## 2019-04-13 DIAGNOSIS — C50511 Malignant neoplasm of lower-outer quadrant of right female breast: Secondary | ICD-10-CM

## 2019-04-13 DIAGNOSIS — Z923 Personal history of irradiation: Secondary | ICD-10-CM | POA: Insufficient documentation

## 2019-04-13 DIAGNOSIS — Z86000 Personal history of in-situ neoplasm of breast: Secondary | ICD-10-CM | POA: Diagnosis present

## 2019-04-13 DIAGNOSIS — Z8673 Personal history of transient ischemic attack (TIA), and cerebral infarction without residual deficits: Secondary | ICD-10-CM | POA: Diagnosis not present

## 2019-04-13 NOTE — Progress Notes (Signed)
Radiation Oncology Follow up Note  Name: Melissa Day   Date:   04/13/2019 MRN:  [id number] DOB: Mar 16, 1970    This 49 y.o. female presents to the clinic today for 2-year follow-up status post whole breast radiation to her left breast for ductal carcinoma in situ.  REFERRING PROVIDER: Tracie Harrier, MD  HPI: Patient is a 49 year old female now out 2 years having completed whole breast radiation to her left breast for ductal carcinoma in situ.  She has a history of CVA is not on antiestrogen therapy.  She is seen today in routine follow-up is doing well specifically denies breast tenderness cough or bone pain..  She had mammograms last October which I have reviewed were BI-RADS 2 benign.  COMPLICATIONS OF TREATMENT: none  FOLLOW UP COMPLIANCE: keeps appointments   PHYSICAL EXAM:  BP 130/82   Pulse 98   Temp (!) 96.1 F (35.6 C)   Resp 16   Wt 213 lb 3.2 oz (96.7 kg)   BMI 37.77 kg/m  Lungs are clear to A&P cardiac examination essentially unremarkable with regular rate and rhythm. No dominant mass or nodularity is noted in either breast in 2 positions examined. Incision is well-healed. No axillary or supraclavicular adenopathy is appreciated. Cosmetic result is excellent.  There is been some considerable shrinkage of her left breast although cosmetic result is still good to excellent.  Well-developed well-nourished patient in NAD. HEENT reveals PERLA, EOMI, discs not visualized.  Oral cavity is clear. No oral mucosal lesions are identified. Neck is clear without evidence of cervical or supraclavicular adenopathy. Lungs are clear to A&P. Cardiac examination is essentially unremarkable with regular rate and rhythm without murmur rub or thrill. Abdomen is benign with no organomegaly or masses noted. Motor sensory and DTR levels are equal and symmetric in the upper and lower extremities. Cranial nerves II through XII are grossly intact. Proprioception is intact. No peripheral adenopathy or  edema is identified. No motor or sensory levels are noted. Crude visual fields are within normal range.  RADIOLOGY RESULTS: Mammograms reviewed compatible with above-stated findings  PLAN: Present time she is 2 years out with no evidence of disease I am pleased with her overall progress.  I have asked to see her back in 1 year for follow-up.  She already has follow-up mammograms ordered.  Patient knows to call with any concerns.  I would like to take this opportunity to thank you for allowing me to participate in the care of your patient.Noreene Filbert, MD

## 2019-11-09 ENCOUNTER — Other Ambulatory Visit: Payer: Self-pay | Admitting: Internal Medicine

## 2019-11-09 DIAGNOSIS — Z9889 Other specified postprocedural states: Secondary | ICD-10-CM

## 2019-11-30 ENCOUNTER — Ambulatory Visit
Admission: RE | Admit: 2019-11-30 | Discharge: 2019-11-30 | Disposition: A | Discharge status: Home / Self Care | Payer: Medicare Other | Source: Ambulatory Visit | Attending: Internal Medicine | Admitting: Internal Medicine

## 2019-11-30 ENCOUNTER — Other Ambulatory Visit: Payer: Self-pay

## 2019-11-30 DIAGNOSIS — N6002 Solitary cyst of left breast: Secondary | ICD-10-CM | POA: Diagnosis not present

## 2019-11-30 DIAGNOSIS — Z9889 Other specified postprocedural states: Secondary | ICD-10-CM

## 2019-11-30 DIAGNOSIS — Z853 Personal history of malignant neoplasm of breast: Secondary | ICD-10-CM | POA: Diagnosis not present

## 2020-02-26 ENCOUNTER — Ambulatory Visit
Admission: EM | Admit: 2020-02-26 | Discharge: 2020-02-26 | Disposition: A | Discharge status: Home / Self Care | Payer: Medicare Other | Attending: Family Medicine | Admitting: Family Medicine

## 2020-02-26 ENCOUNTER — Ambulatory Visit (INDEPENDENT_AMBULATORY_CARE_PROVIDER_SITE_OTHER): Payer: Medicare Other

## 2020-02-26 ENCOUNTER — Other Ambulatory Visit: Payer: Self-pay

## 2020-02-26 ENCOUNTER — Encounter: Payer: Self-pay | Admitting: Emergency Medicine

## 2020-02-26 DIAGNOSIS — M79671 Pain in right foot: Secondary | ICD-10-CM

## 2020-02-26 NOTE — Discharge Instructions (Signed)
Tylenol as needed.  Follow up with Dr. Cristobal Goldmann.  Take care  Dr. Lacinda Axon

## 2020-02-26 NOTE — ED Provider Notes (Signed)
[facility]    CSN: [id number] Arrival date & time: 02/26/20  0820      History   Chief Complaint Chief Complaint  Patient presents with  . Foot Pain    right   HPI  50 year old female presents with the above complaint.  Patient reports ongoing right foot pain.  She has had a prior CVA and has right-sided hemiparesis and spasticity.  Patient ports a 2-week history of right foot pain.  Localizes the pain to the dorsum of the right foot.  Pain 7/10 in severity.  She denies any fall, trauma, injury.  However, her gait is altered due to prior stroke.  She believes this may be the culprit of her pain.  No relieving factors.  No other associated symptoms.  No other complaints.  Past Medical History:  Diagnosis Date  . Anemia   . Cancer ([place]) 2018   br cancer on left with lump and rad tx   . Diabetes mellitus without complication ([place])   . Family history of colonic polyps   . H/O: CVA (cerebrovascular accident) 2012   a. cerebral edema, craniotomy, residual right upper & lower limb weakness  . Heart murmur   . High cholesterol   . History of colon polyps   . History of echocardiogram    a. 12/01/2013: EF 50-55%, nl global LV systolic function, mild MR, mildly increased LV posterior wall thickness  . History of kidney stones   . History of stress test    a. 12/01/2013: no significant ischemia, no significant WMA, no EKG changes concerning for ischemia, EF 67%, low risk scan  . Hx of seizure disorder    a. one episode 10 months after CVA, since then on Keppra   . Hypothyroidism   . Personal history of radiation therapy 2019   LEFT lumpectomy  . Seizures ([place])   . Stroke ([place]) 2012  . Thyroid disease     Patient Active Problem List   Diagnosis Date Noted  . Type 2 diabetes mellitus with hyperglycemia, without long-term current use of insulin ([place]) 03/09/2018  . Recurrent major depressive disorder, in partial remission ([place]) 03/09/2018  . H/O submandibular gland  removal 12/15/2017  . Elevated blood sugar level 07/08/2017  . Ductal carcinoma in situ (DCIS) of left breast 01/18/2017  . History of renal stone 01/15/2015  . Ureteral stone 10/29/2014  . Chest pain 08/21/2014  . Malaise and fatigue 08/21/2014  . Morbid obesity ([place]) 08/21/2014  . H/O: CVA (cerebrovascular accident)   . Hx of seizure disorder   . History of echocardiogram   . History of stress test   . Heart murmur   . Thyroid disease   . Clinical depression 12/12/2013  . Vitamin D deficiency 12/12/2013  . Spastic hemiplegia affecting dominant side ([place]) 05/13/2011  . Hemiplegia as late effect of cerebrovascular disease ([place]) 11/18/2010    Past Surgical History:  Procedure Laterality Date  . BRAIN SURGERY     decompression after stroke  . BREAST BIOPSY Left 12/13/2016   path pending  . BREAST LUMPECTOMY Left 01/06/2017   DUCTAL CARCINOMA IN SITU, NUCLEAR GRADE 3.  . BREAST LUMPECTOMY WITH NEEDLE LOCALIZATION Left 01/06/2017   Procedure: BREAST LUMPECTOMY WITH NEEDLE LOCALIZATION;  Surgeon: Leonie Green, MD;  Location: [facility] ORS;  Service: General;  Laterality: Left;  . COLONOSCOPY WITH PROPOFOL N/A 11/18/2014   Procedure: COLONOSCOPY WITH PROPOFOL;  Surgeon: Josefine Class, MD;  Location[facility] ENDOSCOPY;  Service: Endoscopy;  Laterality: N/A;  .  CYSTOSCOPY/RETROGRADE/URETEROSCOPY/STONE EXTRACTION WITH BASKET Right 10/29/2014   Procedure: CYSTOSCOPY/URETEROSCOPY/STONE EXTRACTION WITH BASKET;  Surgeon: Franchot Gallo, MD;  Location: [facility] ORS;  Service: Urology;  Laterality: Right;  . ESOPHAGOGASTRODUODENOSCOPY (EGD) WITH PROPOFOL N/A 11/18/2014   Procedure: ESOPHAGOGASTRODUODENOSCOPY (EGD) WITH PROPOFOL;  Surgeon: Josefine Class, MD;  Location: [facility] ENDOSCOPY;  Service: Endoscopy;  Laterality: N/A;  . RE-EXCISION OF BREAST LUMPECTOMY Left 01/25/2017   Procedure: RE-EXCISION OF BREAST LUMPECTOMY;  Surgeon: Leonie Green, MD;  Location: [facility] ORS;  Service:  General;  Laterality: Left;  . SUBMANDIBULAR GLAND EXCISION Right 12/15/2017   Procedure: EXCISION SUBMANDIBULAR GLAND;  Surgeon: Carloyn Manner, MD;  Location: [facility] ORS;  Service: ENT;  Laterality: Right;  . TONSILLECTOMY      OB History   No obstetric history on file.      Home Medications    Prior to Admission medications   Medication Sig Start Date End Date Taking? Authorizing Provider  aspirin EC 81 MG tablet Take 81 mg by mouth daily.   Yes [provider]  atorvastatin (LIPITOR) 40 MG tablet Take 1 tablet (40 mg total) by mouth daily. 06/21/14  Yes Dunn, Areta Haber, PA-C  FLUoxetine (PROZAC) 40 MG capsule Take 40 mg by mouth daily.   Yes [provider]  levETIRAcetam (KEPPRA) 750 MG tablet Take 1 tablet (750 mg total) by mouth 2 (two) times daily. 08/21/14  Yes Minna Merritts, MD  levothyroxine (SYNTHROID, LEVOTHROID) 137 MCG tablet Take 137 mcg daily before breakfast by mouth.  10/29/16  Yes [provider]  metFORMIN (GLUCOPHAGE) 500 MG tablet Take 500 mg by mouth daily with breakfast.   Yes [provider]  HYDROcodone-acetaminophen (NORCO) 5-325 MG tablet Take 1 tablet by mouth every 4 (four) hours as needed for moderate pain. 12/16/17   Vaught, Jeannie Fend, MD  ondansetron (ZOFRAN) 4 MG tablet Take 1 tablet (4 mg total) by mouth every 4 (four) hours as needed for nausea. 12/16/17   Carloyn Manner, MD    Family History Family History  Problem Relation Age of Onset  . Nephrolithiasis Mother   . Non-Hodgkin's lymphoma Mother 3       currently 22  . Heart disease Father   . CVA Father   . Nephrolithiasis Maternal Grandfather   . Diabetes Maternal Grandfather   . Pancreatic cancer Maternal Grandfather 23       deceased 63  . Breast cancer Sister 70       negative genetic testing; currently 38  . Lymphoma Maternal Grandmother        deceased 35  . Breast cancer Paternal Aunt        4 more paternal aunts with breast cancer dx 60s-70s   . Breast cancer Cousin        2 paternal cousins; daughters of pat aunt w/ BC at 28  . Colon cancer Cousin 73       son of mat aunt  . Breast cancer Paternal Aunt 63       deceased 69 of MI    Social History Social History   Tobacco Use  . Smoking status: Former Smoker    Packs/day: 1.00    Years: 15.00    Pack years: 15.00    Types: Cigarettes    Quit date: 12/30/2011    Years since quitting: 8.1  . Smokeless tobacco: Never Used  Vaping Use  . Vaping Use: Never used  Substance Use Topics  . Alcohol use: No  . Drug use: No  Allergies   Patient has no known allergies.   Review of Systems Review of Systems  Constitutional: Negative.   Musculoskeletal:       Right foot pain.   Physical Exam Triage Vital Signs ED Triage Vitals  Enc Vitals Group     BP 02/26/20 0837 (!) 136/99     Pulse Rate 02/26/20 0837 84     Resp 02/26/20 0837 18     Temp 02/26/20 0837 98 F (36.7 C)     Temp Source 02/26/20 0837 Oral     SpO2 02/26/20 0837 98 %     Weight 02/26/20 0835 213 lb 3 oz (96.7 kg)     Height 02/26/20 0835 5\' 3"  (1.6 m)     Head Circumference --      Peak Flow --      Pain Score 02/26/20 0834 7     Pain Loc --      Pain Edu? --      Excl. in [place]? --    Updated Vital Signs BP (!) 136/99 (BP Location: Left Arm)   Pulse 84   Temp 98 F (36.7 C) (Oral)   Resp 18   Ht 5\' 3"  (1.6 m)   Wt 96.7 kg   SpO2 98%   BMI 37.76 kg/m   Visual Acuity Right Eye Distance:   Left Eye Distance:   Bilateral Distance:    Right Eye Near:   Left Eye Near:    Bilateral Near:     Physical Exam Vitals and nursing note reviewed.  Constitutional:      General: She is not in acute distress.    Appearance: She is obese. She is not ill-appearing.  HENT:     Head: Normocephalic and atraumatic.  Eyes:     General:        Right eye: No discharge.        Left eye: No discharge.     Conjunctiva/sclera: Conjunctivae normal.  Musculoskeletal:     Comments: Right foot  -right foot is supinated.  Tenderness on the dorsum of the foot.  No bruising.  No erythema.  Neurological:     Mental Status: She is alert.  Psychiatric:        Mood and Affect: Mood normal.        Behavior: Behavior normal.    UC Treatments / Results  Labs (all labs ordered are listed, but only abnormal results are displayed) Labs Reviewed - No data to display  EKG   Radiology DG Foot Complete Right  Result Date: 02/26/2020 CLINICAL DATA:  Right foot pain EXAM: RIGHT FOOT COMPLETE - 3+ VIEW COMPARISON:  None. FINDINGS: There is no evidence of fracture or dislocation. There is no evidence of arthropathy or other focal bone abnormality. Soft tissues are unremarkable. IMPRESSION: No acute osseous finding. Electronically Signed   By: Jerilynn Mages.  Shick M.D.   On: 02/26/2020 09:25    Procedures Procedures (including critical care time)  Medications Ordered in UC Medications - No data to display  Initial Impression / Assessment and Plan / UC Course  I have reviewed the triage vital signs and the nursing notes.  Pertinent labs & imaging results that were available during my care of the patient were reviewed by me and considered in my medical decision making (see chart for details).    50 year old female presents with foot pain. Xray obtained and was normal. This is secondary to gait abnormality from stroke. Advised to follow up with  her physician (Dr. Cristobal Goldmann). Tylenol as needed.     Final Clinical Impressions(s) / UC Diagnoses   Final diagnoses:  Foot pain, right     Discharge Instructions     Tylenol as needed.  Follow up with Dr. Cristobal Goldmann.  Take care  Dr. Lacinda Axon     ED Prescriptions    None     PDMP not reviewed this encounter.   Coral Spikes, DO 02/26/20 1010

## 2020-02-26 NOTE — ED Triage Notes (Signed)
Pt c/o right foot pain. Pain is located on top of her foot. Started about 2 weeks ago. She denies injury but states she has to drag this foot due to having a CVA.

## 2020-04-11 ENCOUNTER — Ambulatory Visit
Admission: RE | Admit: 2020-04-11 | Discharge: 2020-04-11 | Disposition: A | Discharge status: Home / Self Care | Payer: Medicare Other | Source: Ambulatory Visit | Attending: Radiation Oncology | Admitting: Radiation Oncology

## 2020-04-11 VITALS — BP 129/85 | HR 86 | Temp 98.0°F | Resp 18 | Wt 215.7 lb

## 2020-04-11 DIAGNOSIS — Z8673 Personal history of transient ischemic attack (TIA), and cerebral infarction without residual deficits: Secondary | ICD-10-CM | POA: Insufficient documentation

## 2020-04-11 DIAGNOSIS — C50511 Malignant neoplasm of lower-outer quadrant of right female breast: Secondary | ICD-10-CM

## 2020-04-11 DIAGNOSIS — Z86000 Personal history of in-situ neoplasm of breast: Secondary | ICD-10-CM | POA: Insufficient documentation

## 2020-04-11 DIAGNOSIS — Z923 Personal history of irradiation: Secondary | ICD-10-CM | POA: Diagnosis not present

## 2020-04-11 NOTE — Progress Notes (Signed)
Radiation Oncology Follow up Note  Name: Melissa Day   Date:   04/11/2020 MRN:  [id number] DOB: 20-Dec-1970    This 50 y.o. 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  She is seen today in routine follow-up she felt something in her left breast which to me is benign soft almost nondiscernible.  She is not on antiestrogen therapy based on prior history of CVA..  Mammogram and ultrasound performed in October showed no evidence of disease BI-RADS 2 benign.  COMPLICATIONS OF TREATMENT: none  FOLLOW UP COMPLIANCE: keeps appointments   PHYSICAL EXAM:  BP 129/85 (BP Location: Left Arm, Patient Position: Sitting)   Pulse 86   Temp 98 F (36.7 C) (Tympanic)   Resp 18   Wt 215 lb 11.2 oz (97.8 kg)   BMI 38.21 kg/m  Lungs are clear to A&P cardiac examination essentially unremarkable with regular rate and rhythm. No dominant mass or nodularity is noted in either breast in 2 positions examined. Incision is well-healed. No axillary or supraclavicular adenopathy is appreciated. Cosmetic result is excellent.  Area of concern is soft nontender not consistent with malignancy.  Well-developed well-nourished patient in NAD. HEENT reveals PERLA, EOMI, discs not visualized.  Oral cavity is clear. No oral mucosal lesions are identified. Neck is clear without evidence of cervical or supraclavicular adenopathy. Lungs are clear to A&P. Cardiac examination is essentially unremarkable with regular rate and rhythm without murmur rub or thrill. Abdomen is benign with no organomegaly or masses noted. Motor sensory and DTR levels are equal and symmetric in the upper and lower extremities. Cranial nerves II through XII are grossly  intact. Proprioception is intact. No peripheral adenopathy or edema is identified. No motor or sensory levels are noted. Crude visual fields are within normal range.  RADIOLOGY RESULTS: Mammogram and ultrasound reviewed compatible with above-stated findings  PLAN: Present time patient is doing well with no evidence of disease.  I am pleased with her overall progress.  I have asked to see her back in 1 year for follow-up.  Again she does not take antiestrogen therapy because of history of CVA.  Patient knows to call with any concerns.  I would like to take this opportunity to thank you for allowing me to participate in the care of your patient.Noreene Filbert, MD

## 2020-07-15 IMAGING — CT CT NECK W/ CM
2 of 3 series · 8 of 14 positions shown, 10 images · IV contrast (omnipaque)
Comparison: Brain MRI and head CT 07/29/2017.

CLINICAL DATA: 47-year-old female with 1 week of right neck
swelling. History of left breast cancer/lumpectomy in 9101.

EXAM:
CT NECK WITH CONTRAST
TECHNIQUE: Multidetector CT imaging of the neck was performed using the
standard protocol following the bolus administration of intravenous
contrast.
CONTRAST:  75mL OMNIPAQUE IOHEXOL 300 MG/ML  SOLN

[Series 2: axial neck · axial · 0.64mm/px · z∈[-454,-346]mm · 3 of 109 slices shown]
[im 28/109  bone]
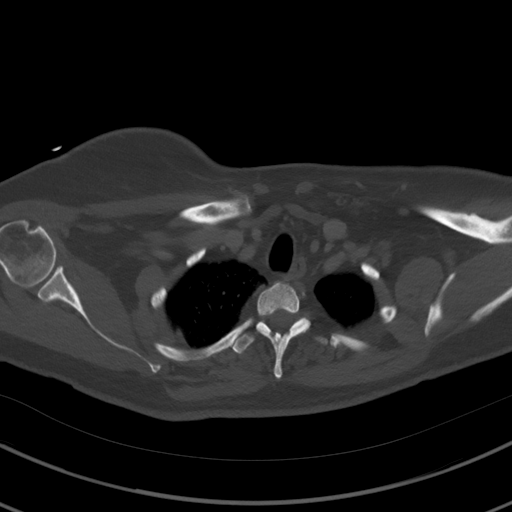
[im 55/109  bone]
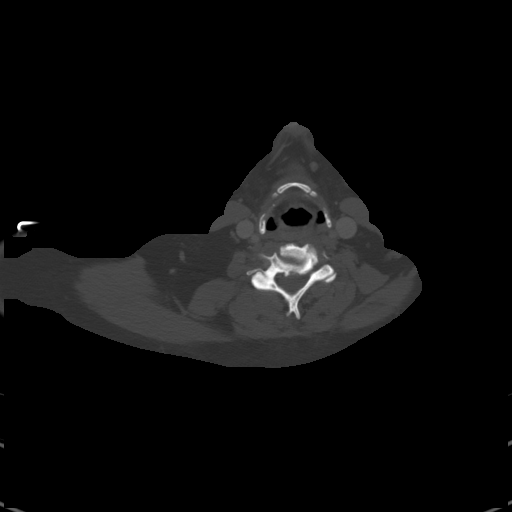
[im 82/109  bone]
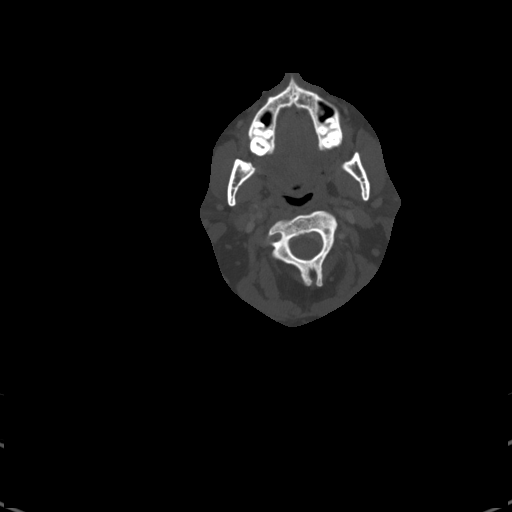

[Series 7: orthogonal ax · axial · 0.49mm/px · z∈[-517,-337]mm · 5 of 140 slices shown, 7 images]
[im 24/140  soft-tissue]
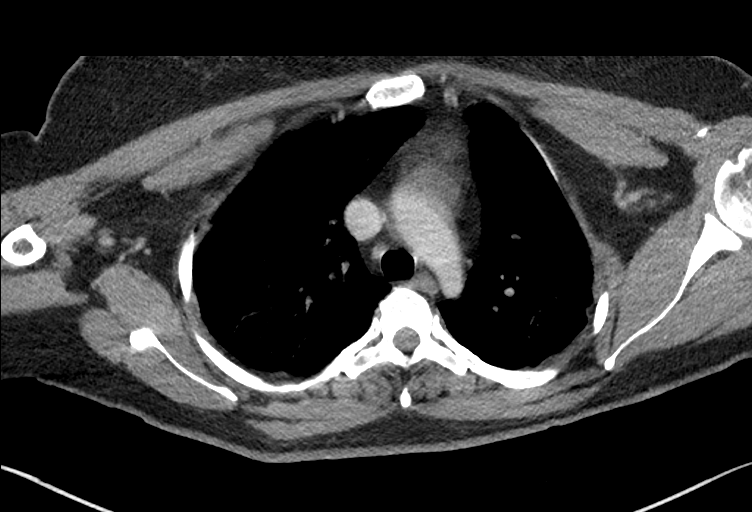
[im 24/140  bone]
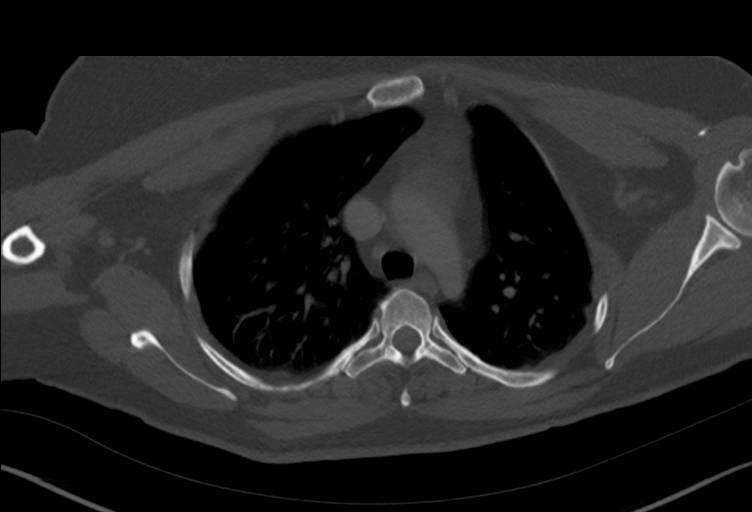
[im 47/140  bone]
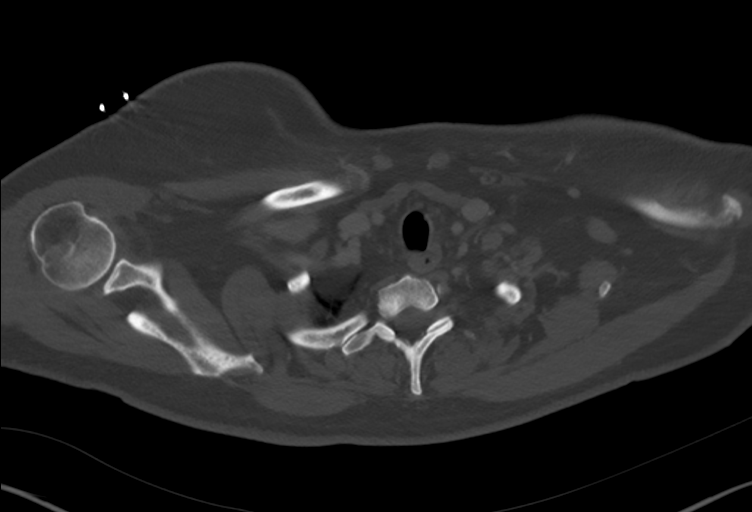
[im 70/140  bone]
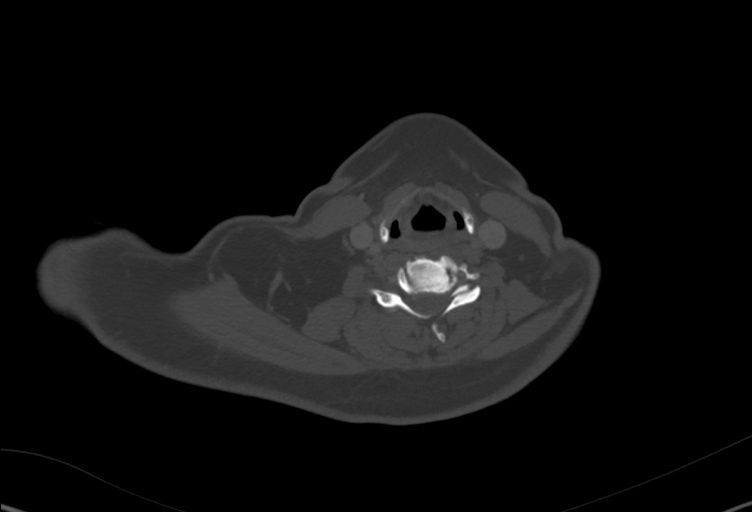
[im 93/140  bone]
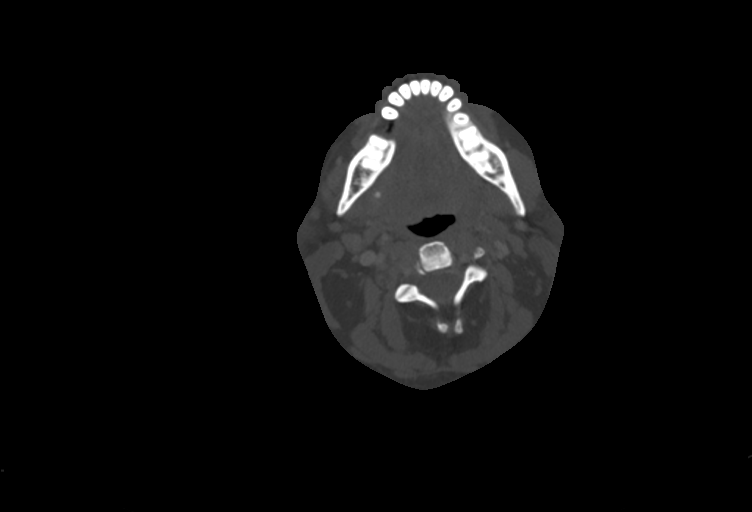
[im 116/140  soft-tissue]
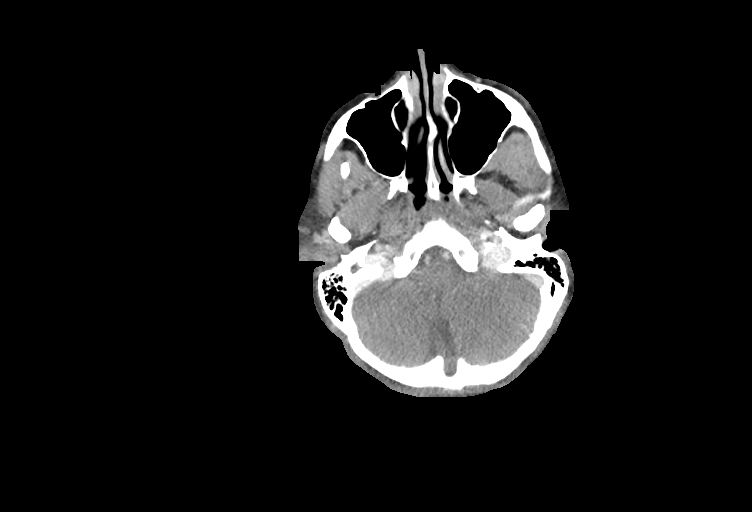
[im 116/140  bone]
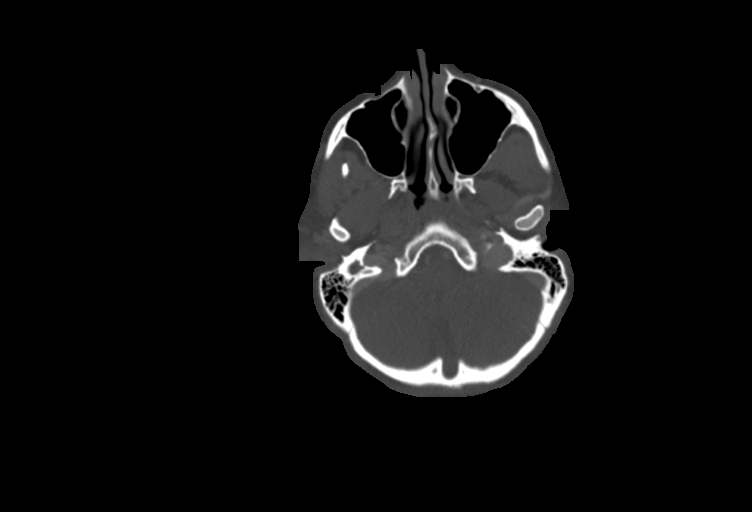

[8 of 14 positions shown; findings below may reference images not displayed]

FINDINGS: Pharynx and larynx: Negative larynx and pharynx. Negative
parapharyngeal and retropharyngeal spaces.

Salivary glands: The parotid glands are within normal limits. The
sublingual space and left submandibular gland are within normal
limits.

There is mild asymmetric hyperenhancement and enlargement of the
right submandibular gland (coronal image 47) with a superimposed
large, lamellated calculus along the hilum of the gland, up to 16
millimeters (same image). There is a smaller adjacent 3 x 5
millimeter calculus located just posterior to the main stone. There
is minimal to mild right submandibular space inflammatory stranding.
The palpable area of concern is marked along the level of the right
submandibular space on series 2, image 47.

Thyroid: Negative.

Lymph nodes: Negative. Bilateral cervical lymph nodes are symmetric
and within normal limits.

Vascular: Major vascular structures in the neck and at the skull
base are patent and appear normal.

Limited intracranial: Negative.

Visualized orbits: Negative.

Mastoids and visualized paranasal sinuses: Clear.

Skeleton: No acute osseous abnormality identified. Lower cervical
spine disc and endplate degeneration.

Upper chest: Normal visible mediastinum. There is a round 5-6
millimeter lung nodule in the posterior aspect of the right upper
lobe near the major fissure on series 8, image 35. Otherwise
negative visible lung parenchyma. Negative visible axillary lymph
nodes.
IMPRESSION: 1. Bulky right submandibular gland sialolithiasis with mild acute
sialoadenitis. This corresponds to the palpable area of concern.
2. Otherwise negative Neck CT, see #3.
3. Right upper lobe 5-6 mm lung nodule. Recommend follow-up Chest CT
(noncontrast should suffice).

## 2020-08-15 ENCOUNTER — Other Ambulatory Visit: Payer: Self-pay | Admitting: Internal Medicine

## 2020-08-18 ENCOUNTER — Other Ambulatory Visit: Payer: Self-pay | Admitting: Internal Medicine

## 2020-08-18 DIAGNOSIS — D0512 Intraductal carcinoma in situ of left breast: Secondary | ICD-10-CM

## 2020-08-18 DIAGNOSIS — N6321 Unspecified lump in the left breast, upper outer quadrant: Secondary | ICD-10-CM

## 2020-08-19 ENCOUNTER — Encounter
Admission: RE | Disposition: A | Discharge status: Home / Self Care | Payer: Self-pay | Source: Home / Self Care | Attending: Gastroenterology

## 2020-08-19 ENCOUNTER — Encounter: Payer: Self-pay | Admitting: *Deleted

## 2020-08-19 ENCOUNTER — Ambulatory Visit: Payer: Medicare Other | Admitting: Anesthesiology

## 2020-08-19 ENCOUNTER — Ambulatory Visit
Admission: RE | Admit: 2020-08-19 | Discharge: 2020-08-19 | Disposition: A | Discharge status: Home / Self Care | Payer: Medicare Other | Attending: Gastroenterology | Admitting: Gastroenterology

## 2020-08-19 DIAGNOSIS — Z79890 Hormone replacement therapy (postmenopausal): Secondary | ICD-10-CM | POA: Diagnosis not present

## 2020-08-19 DIAGNOSIS — E119 Type 2 diabetes mellitus without complications: Secondary | ICD-10-CM | POA: Diagnosis not present

## 2020-08-19 DIAGNOSIS — E785 Hyperlipidemia, unspecified: Secondary | ICD-10-CM | POA: Insufficient documentation

## 2020-08-19 DIAGNOSIS — E039 Hypothyroidism, unspecified: Secondary | ICD-10-CM | POA: Diagnosis not present

## 2020-08-19 DIAGNOSIS — Z8371 Family history of colonic polyps: Secondary | ICD-10-CM | POA: Insufficient documentation

## 2020-08-19 DIAGNOSIS — Z7982 Long term (current) use of aspirin: Secondary | ICD-10-CM | POA: Insufficient documentation

## 2020-08-19 DIAGNOSIS — Z1211 Encounter for screening for malignant neoplasm of colon: Secondary | ICD-10-CM | POA: Insufficient documentation

## 2020-08-19 DIAGNOSIS — Z923 Personal history of irradiation: Secondary | ICD-10-CM | POA: Insufficient documentation

## 2020-08-19 DIAGNOSIS — D127 Benign neoplasm of rectosigmoid junction: Secondary | ICD-10-CM | POA: Diagnosis not present

## 2020-08-19 DIAGNOSIS — Z7984 Long term (current) use of oral hypoglycemic drugs: Secondary | ICD-10-CM | POA: Diagnosis not present

## 2020-08-19 DIAGNOSIS — Z79899 Other long term (current) drug therapy: Secondary | ICD-10-CM | POA: Insufficient documentation

## 2020-08-19 DIAGNOSIS — Z853 Personal history of malignant neoplasm of breast: Secondary | ICD-10-CM | POA: Diagnosis not present

## 2020-08-19 HISTORY — PX: COLONOSCOPY WITH PROPOFOL: SHX5780

## 2020-08-19 LAB — POCT PREGNANCY, URINE: Preg Test, Ur: NEGATIVE

## 2020-08-19 LAB — GLUCOSE, CAPILLARY: Glucose-Capillary: 151 mg/dL — ABNORMAL HIGH (ref 70–99)

## 2020-08-19 SURGERY — COLONOSCOPY WITH PROPOFOL
Anesthesia: General

## 2020-08-19 MED ORDER — PROPOFOL 10 MG/ML IV BOLUS
INTRAVENOUS | Status: DC | PRN
  Administered 2020-08-19: 100 mg via INTRAVENOUS

## 2020-08-19 MED ORDER — LIDOCAINE HCL (PF) 2 % IJ SOLN
INTRAMUSCULAR | Status: AC
  Filled 2020-08-19: qty 5

## 2020-08-19 MED ORDER — PROPOFOL 500 MG/50ML IV EMUL
INTRAVENOUS | Status: DC | PRN
  Administered 2020-08-19: 120 ug/kg/min via INTRAVENOUS

## 2020-08-19 MED ORDER — LIDOCAINE HCL (CARDIAC) PF 100 MG/5ML IV SOSY
PREFILLED_SYRINGE | INTRAVENOUS | Status: DC | PRN
  Administered 2020-08-19: 50 mg via INTRAVENOUS

## 2020-08-19 MED ORDER — SODIUM CHLORIDE 0.9 % IV SOLN: INTRAVENOUS | Status: DC

## 2020-08-19 MED ORDER — PROPOFOL 500 MG/50ML IV EMUL
INTRAVENOUS | Status: AC
  Filled 2020-08-19: qty 50

## 2020-08-19 NOTE — Interval H&P Note (Signed)
History and Physical Interval Note:  08/19/2020 8:17 AM  Melissa Day  has presented today for surgery, with the diagnosis of FAM HX COLON POLYPS.  The various methods of treatment have been discussed with the patient and family. After consideration of risks, benefits and other options for treatment, the patient has consented to  Procedure(s): COLONOSCOPY WITH PROPOFOL (N/A) as a surgical intervention.  The patient's history has been reviewed, patient examined, no change in status, stable for surgery.  I have reviewed the patient's chart and labs.  Questions were answered to the patient's satisfaction.     Lesly Rubenstein  Ok to proceed with colonoscopy

## 2020-08-19 NOTE — Op Note (Signed)
[facility] Patient Name: Melissa Day Procedure Date: 08/19/2020 8:06 AM MRN: [id number] Account #: [id number] Date of Birth: November 29, 1970 Admit Type: Outpatient Age: 50 Room: [facility] ENDO ROOM 1 Gender: Female Note Status: Finalized Procedure:             Colonoscopy Indications:           Colon cancer screening in patient at increased risk:                         Family history of colon polyps in multiple 1st-degree                         relatives Providers:             Andrey Farmer MD, MD Referring MD:          Tracie Harrier, MD (Referring MD) Medicines:             Monitored Anesthesia Care Complications:         No immediate complications. Estimated blood loss:                         Minimal. Procedure:             Pre-Anesthesia Assessment:                        - Prior to the procedure, a History and Physical was                         performed, and patient medications and allergies were                         reviewed. The patient is competent. The risks and                         benefits of the procedure and the sedation options and                         risks were discussed with the patient. All questions                         were answered and informed consent was obtained.                         Patient identification and proposed procedure were                         verified by the physician, the nurse, the anesthetist                         and the technician in the endoscopy suite. Mental                         Status Examination: alert and oriented. Airway                         Examination: normal oropharyngeal airway and neck  mobility. Respiratory Examination: clear to                         auscultation. CV Examination: normal. Prophylactic                         Antibiotics: The patient does not require prophylactic                         antibiotics. Prior Anticoagulants: The  patient has                         taken no previous anticoagulant or antiplatelet                         agents. ASA Grade Assessment: II - A patient with mild                         systemic disease. After reviewing the risks and                         benefits, the patient was deemed in satisfactory                         condition to undergo the procedure. The anesthesia                         plan was to use monitored anesthesia care (MAC).                         Immediately prior to administration of medications,                         the patient was re-assessed for adequacy to receive                         sedatives. The heart rate, respiratory rate, oxygen                         saturations, blood pressure, adequacy of pulmonary                         ventilation, and response to care were monitored                         throughout the procedure. The physical status of the                         patient was re-assessed after the procedure.                        After obtaining informed consent, the colonoscope was                         passed under direct vision. Throughout the procedure,                         the patient's blood pressure, pulse, and oxygen  saturations were monitored continuously. The                         Colonoscope was introduced through the anus and                         advanced to the the terminal ileum. The colonoscopy                         was performed without difficulty. The patient                         tolerated the procedure well. The quality of the bowel                         preparation was adequate to identify polyps. Findings:      The perianal and digital rectal examinations were normal.      The terminal ileum appeared normal.      A 2 mm polyp was found in the recto-sigmoid colon. The polyp was       sessile. The polyp was removed with a cold snare. Resection and       retrieval were  complete. Estimated blood loss was minimal.      The exam was otherwise without abnormality on direct and retroflexion       views. Impression:            - The examined portion of the ileum was normal.                        - One 2 mm polyp at the recto-sigmoid colon, removed                         with a cold snare. Resected and retrieved.                        - The examination was otherwise normal on direct and                         retroflexion views. Recommendation:        - Discharge patient to home.                        - Resume previous diet.                        - Continue present medications.                        - Await pathology results.                        - Repeat colonoscopy for surveillance based on                         pathology results.                        - Return to referring physician as previously  scheduled. Procedure Code(s):     --- Professional ---                        [phone], Colonoscopy, flexible; with removal of                         tumor(s), polyp(s), or other lesion(s) by snare                         technique Diagnosis Code(s):     --- Professional ---                        Z83.71, Family history of colonic polyps                        K63.5, Polyp of colon CPT copyright 2019 [employer]. All rights reserved. The codes documented in this report are preliminary and upon coder review may  be revised to meet current compliance requirements. Andrey Farmer MD, MD 08/19/2020 8:42:49 AM Number of Addenda: 0 Note Initiated On: 08/19/2020 8:06 AM Scope Withdrawal Time: 0 hours 12 minutes 7 seconds  Total Procedure Duration: 0 hours 17 minutes 18 seconds  Estimated Blood Loss:  Estimated blood loss was minimal.      [facility]

## 2020-08-19 NOTE — Transfer of Care (Signed)
Immediate Anesthesia Transfer of Care Note  Patient: Melissa Day  Procedure(s) Performed: COLONOSCOPY WITH PROPOFOL  Patient Location: PACU and Endoscopy Unit  Anesthesia Type:General  Level of Consciousness: drowsy and patient cooperative  Airway & Oxygen Therapy: Patient Spontanous Breathing  Post-op Assessment: Report given to RN and Post -op Vital signs reviewed and stable  Post vital signs: Reviewed and stable  Last Vitals:  Vitals Value Taken Time  BP 128/71 08/19/20 0844  Temp    Pulse    Resp 21 08/19/20 0844  SpO2 98 % 08/19/20 0844    Last Pain:  Vitals:   08/19/20 0844  TempSrc:   PainSc: 0-No pain         Complications: No notable events documented.

## 2020-08-19 NOTE — Anesthesia Preprocedure Evaluation (Signed)
Anesthesia Evaluation  Patient identified by MRN, date of birth, ID band Patient awake    Reviewed: Allergy & Precautions, NPO status , Patient's Chart, lab work & pertinent test results, reviewed documented beta blocker date and time   History of Anesthesia Complications Negative for: history of anesthetic complications  Airway Mallampati: III  TM Distance: <3 FB Neck ROM: full    Dental  (+) Chipped   Pulmonary neg shortness of breath, former smoker,    Pulmonary exam normal        Cardiovascular Exercise Tolerance: Good (-) hypertension(-) angina(-) CAD, (-) Past MI, (-) Cardiac Stents and (-) CABG Normal cardiovascular exam(-) dysrhythmias + Valvular Problems/Murmurs      Neuro/Psych Seizures -, Well Controlled,  PSYCHIATRIC DISORDERS (depression) Depression CVA, Residual Symptoms    GI/Hepatic negative GI ROS, Neg liver ROS,   Endo/Other  diabetesHypothyroidism   Renal/GU negative Renal ROS  negative genitourinary   Musculoskeletal   Abdominal   Peds  Hematology negative hematology ROS (+)   Anesthesia Other Findings Past Medical History:   H/O: CVA (cerebrovascular accident)             2012           Comment:a. cerebral edema, craniotomy, residual right               upper & lower limb weakness   Hx of seizure disorder                                         Comment:a. one episode 10 months after CVA, since then               on Keppra    History of echocardiogram                                      Comment:a. 12/01/2013: EF 50-55%, nl global LV systolic              function, mild MR, mildly increased LV               posterior wall thickness   History of stress test                                         Comment:a. 12/01/2013: no significant ischemia, no               significant WMA, no EKG changes concerning for               ischemia, EF 67%, low risk scan   Heart murmur                                                  Thyroid disease                                              Stroke  High cholesterol                                             Reproductive/Obstetrics negative OB ROS                             Anesthesia Physical  Anesthesia Plan  ASA: 3  Anesthesia Plan: General   Post-op Pain Management:    Induction: Intravenous  PONV Risk Score and Plan: TIVA and Propofol infusion  Airway Management Planned: Natural Airway and Nasal Cannula  Additional Equipment:   Intra-op Plan:   Post-operative Plan:   Informed Consent: I have reviewed the patients History and Physical, chart, labs and discussed the procedure including the risks, benefits and alternatives for the proposed anesthesia with the patient or authorized representative who has indicated his/her understanding and acceptance.     Dental Advisory Given  Plan Discussed with: Anesthesiologist, CRNA and Surgeon  Anesthesia Plan Comments: (Patient consented for risks of anesthesia including but not limited to:  - adverse reactions to medications - risk of airway placement if required - damage to eyes, teeth, lips or other oral mucosa - nerve damage due to positioning  - sore throat or hoarseness - Damage to heart, brain, nerves, lungs, other parts of body or loss of life  Patient voiced understanding.)        Anesthesia Quick Evaluation

## 2020-08-19 NOTE — Anesthesia Postprocedure Evaluation (Signed)
Anesthesia Post Note  Patient: Melissa Day  Procedure(s) Performed: COLONOSCOPY WITH PROPOFOL  Patient location during evaluation: Endoscopy Anesthesia Type: General Level of consciousness: awake and alert Pain management: pain level controlled Vital Signs Assessment: post-procedure vital signs reviewed and stable Respiratory status: spontaneous breathing, nonlabored ventilation, respiratory function stable and patient connected to nasal cannula oxygen Cardiovascular status: blood pressure returned to baseline and stable Postop Assessment: no apparent nausea or vomiting Anesthetic complications: no   No notable events documented.   Last Vitals:  Vitals:   08/19/20 0854 08/19/20 0904  BP: 134/84 (!) 154/81  Resp: 19 15  Temp:    SpO2: 96% 100%    Last Pain:  Vitals:   08/19/20 0904  TempSrc:   PainSc: 0-No pain                 Precious Haws Vannia Pola

## 2020-08-19 NOTE — H&P (Signed)
Outpatient short stay form Pre-procedure 08/19/2020 8:14 AM Melissa Day  Primary Physician: Dr. Ginette Pitman  Reason for visit:  Family history of polyps  History of present illness:   50 y/o lady with history of hypothyroidism, HLD, and DM II here for screening colonoscopy for family history of polyps. Patient's mother required surgery for a large polyp. No family history of GI malignancies.    Current Facility-Administered Medications:    0.9 %  sodium chloride infusion, , Intravenous, Continuous, Dreonna Hussein, Hilton Cork, Day, Last Rate: 20 mL/hr at 08/19/20 0808, New Bag at 08/19/20 0808  Medications Prior to Admission  Medication Sig Dispense Refill Last Dose   levETIRAcetam (KEPPRA) 750 MG tablet Take 1 tablet (750 mg total) by mouth 2 (two) times daily. 28 tablet 0 08/18/2020   levothyroxine (SYNTHROID, LEVOTHROID) 137 MCG tablet Take 137 mcg daily before breakfast by mouth.    08/18/2020   aspirin EC 81 MG tablet Take 81 mg by mouth daily.      atorvastatin (LIPITOR) 40 MG tablet Take 1 tablet (40 mg total) by mouth daily. 30 tablet 3    baclofen (LIORESAL) 10 MG tablet Take 5 mg by mouth 2 (two) times daily.      FLUoxetine (PROZAC) 40 MG capsule Take 40 mg by mouth daily.      metFORMIN (GLUCOPHAGE) 500 MG tablet Take 500 mg by mouth daily with breakfast.      ondansetron (ZOFRAN) 4 MG tablet Take 1 tablet (4 mg total) by mouth every 4 (four) hours as needed for nausea. 20 tablet 0      No Known Allergies   Past Medical History:  Diagnosis Date   Anemia    Cancer ([place]) 2018   br cancer on left with lump and rad tx    Diabetes mellitus without complication ([place])    Family history of colonic polyps    H/O: CVA (cerebrovascular accident) 2012   a. cerebral edema, craniotomy, residual right upper & lower limb weakness   Heart murmur    High cholesterol    History of colon polyps    History of echocardiogram    a. 12/01/2013: EF 50-55%, nl global LV systolic function,  mild MR, mildly increased LV posterior wall thickness   History of kidney stones    History of stress test    a. 12/01/2013: no significant ischemia, no significant WMA, no EKG changes concerning for ischemia, EF 67%, low risk scan   Hx of seizure disorder    a. one episode 10 months after CVA, since then on Keppra    Hypothyroidism    Personal history of radiation therapy 2019   LEFT lumpectomy   Seizures ([place])    Stroke ([place]) 2012   Thyroid disease     Review of systems:  Otherwise negative.    Physical Exam  Gen: Alert, oriented. Appears stated age.  HEENT: PERRLA. Lungs: No respiratory distress CV: RRR Abd: soft, benign, no masses Ext: No edema    Planned procedures: Proceed with colonoscopy. The patient understands the nature of the planned procedure, indications, risks, alternatives and potential complications including but not limited to bleeding, infection, perforation, damage to internal organs and possible oversedation/side effects from anesthesia. The patient agrees and gives consent to proceed.  Please refer to procedure notes for findings, recommendations and patient disposition/instructions.     Melissa Day Gastroenterology 08/19/2020  8:14 AM

## 2020-08-20 ENCOUNTER — Encounter: Payer: Self-pay | Admitting: Gastroenterology

## 2020-08-20 LAB — SURGICAL PATHOLOGY

## 2020-08-21 ENCOUNTER — Ambulatory Visit
Admission: RE | Admit: 2020-08-21 | Discharge: 2020-08-21 | Disposition: A | Discharge status: Home / Self Care | Payer: Medicare Other | Source: Ambulatory Visit | Attending: Internal Medicine | Admitting: Internal Medicine

## 2020-08-21 ENCOUNTER — Other Ambulatory Visit: Payer: Self-pay

## 2020-08-21 DIAGNOSIS — D0512 Intraductal carcinoma in situ of left breast: Secondary | ICD-10-CM

## 2020-08-21 DIAGNOSIS — N6321 Unspecified lump in the left breast, upper outer quadrant: Secondary | ICD-10-CM

## 2020-10-16 ENCOUNTER — Other Ambulatory Visit: Payer: Self-pay

## 2020-10-16 ENCOUNTER — Emergency Department
Admission: EM | Admit: 2020-10-16 | Discharge: 2020-10-16 | Disposition: A | Discharge status: Home / Self Care | Payer: Medicare Other | Attending: Emergency Medicine | Admitting: Emergency Medicine

## 2020-10-16 ENCOUNTER — Emergency Department: Payer: Medicare Other

## 2020-10-16 DIAGNOSIS — Z853 Personal history of malignant neoplasm of breast: Secondary | ICD-10-CM | POA: Diagnosis not present

## 2020-10-16 DIAGNOSIS — M79642 Pain in left hand: Secondary | ICD-10-CM | POA: Diagnosis not present

## 2020-10-16 DIAGNOSIS — E1165 Type 2 diabetes mellitus with hyperglycemia: Secondary | ICD-10-CM | POA: Diagnosis not present

## 2020-10-16 DIAGNOSIS — H53143 Visual discomfort, bilateral: Secondary | ICD-10-CM | POA: Diagnosis not present

## 2020-10-16 DIAGNOSIS — Z79899 Other long term (current) drug therapy: Secondary | ICD-10-CM | POA: Diagnosis not present

## 2020-10-16 DIAGNOSIS — Z87891 Personal history of nicotine dependence: Secondary | ICD-10-CM | POA: Insufficient documentation

## 2020-10-16 DIAGNOSIS — E039 Hypothyroidism, unspecified: Secondary | ICD-10-CM | POA: Diagnosis not present

## 2020-10-16 DIAGNOSIS — Z7982 Long term (current) use of aspirin: Secondary | ICD-10-CM | POA: Insufficient documentation

## 2020-10-16 DIAGNOSIS — R739 Hyperglycemia, unspecified: Secondary | ICD-10-CM

## 2020-10-16 DIAGNOSIS — Z7984 Long term (current) use of oral hypoglycemic drugs: Secondary | ICD-10-CM | POA: Diagnosis not present

## 2020-10-16 DIAGNOSIS — R200 Anesthesia of skin: Secondary | ICD-10-CM | POA: Insufficient documentation

## 2020-10-16 DIAGNOSIS — H538 Other visual disturbances: Secondary | ICD-10-CM

## 2020-10-16 LAB — DIFFERENTIAL
Abs Immature Granulocytes: 0.07 10*3/uL (ref 0.00–0.07)
Basophils Absolute: 0.1 10*3/uL (ref 0.0–0.1)
Basophils Relative: 1 %
Eosinophils Absolute: 0.2 10*3/uL (ref 0.0–0.5)
Eosinophils Relative: 3 %
Immature Granulocytes: 1 %
Lymphocytes Relative: 30 %
Lymphs Abs: 2.5 10*3/uL (ref 0.7–4.0)
Monocytes Absolute: 0.5 10*3/uL (ref 0.1–1.0)
Monocytes Relative: 6 %
Neutro Abs: 5 10*3/uL (ref 1.7–7.7)
Neutrophils Relative %: 59 %

## 2020-10-16 LAB — COMPREHENSIVE METABOLIC PANEL
ALT: 18 U/L (ref 0–44)
AST: 16 U/L (ref 15–41)
Albumin: 3.8 g/dL (ref 3.5–5.0)
Alkaline Phosphatase: 81 U/L (ref 38–126)
Anion gap: 8 (ref 5–15)
BUN: 13 mg/dL (ref 6–20)
CO2: 24 mmol/L (ref 22–32)
Calcium: 8.8 mg/dL — ABNORMAL LOW (ref 8.9–10.3)
Chloride: 104 mmol/L (ref 98–111)
Creatinine, Ser: 0.61 mg/dL (ref 0.44–1.00)
GFR, Estimated: >60 mL/min (ref >60)
Glucose, Bld: 213 mg/dL — ABNORMAL HIGH (ref 70–99)
Potassium: 3.8 mmol/L (ref 3.5–5.1)
Sodium: 136 mmol/L (ref 135–145)
Total Bilirubin: 0.5 mg/dL (ref 0.3–1.2)
Total Protein: 6.6 g/dL (ref 6.5–8.1)

## 2020-10-16 LAB — CBC
HCT: 39.4 % (ref 36.0–46.0)
Hemoglobin: 13.5 g/dL (ref 12.0–15.0)
MCH: 30 pg (ref 26.0–34.0)
MCHC: 34.3 g/dL (ref 30.0–36.0)
MCV: 87.6 fL (ref 80.0–100.0)
Platelets: 288 10*3/uL (ref 150–400)
RBC: 4.5 MIL/uL (ref 3.87–5.11)
RDW: 13.8 % (ref 11.5–15.5)
WBC: 8.3 10*3/uL (ref 4.0–10.5)
nRBC: 0 % (ref 0.0–0.2)

## 2020-10-16 LAB — PROTIME-INR
INR: 1.1 (ref 0.8–1.2)
Prothrombin Time: 13.8 seconds (ref 11.4–15.2)

## 2020-10-16 LAB — APTT: aPTT: 28 seconds (ref 24–36)

## 2020-10-16 MED ORDER — ACETAMINOPHEN 500 MG PO TABS
1000.0000 mg | ORAL_TABLET | Freq: Once | ORAL | Status: AC
  Administered 2020-10-16: 1000 mg via ORAL
  Filled 2020-10-16: qty 2

## 2020-10-16 NOTE — ED Triage Notes (Signed)
Pt to ED with mother for blurred vision in left eye that progressed to right eye starting at 1100 this am, numbness in left arm for 1 month. Pt with hx of stroke, right side deficits and aphasia.  Verbal orders Dr Joni Fears. No code stroke.

## 2020-10-16 NOTE — ED Provider Notes (Signed)
[facility] Emergency Department Provider Note  ____________________________________________   Event Date/Time   First MD Initiated Contact with Patient 10/16/20 1346     (approximate)  I have reviewed the triage vital signs and the nursing notes.   HISTORY  Chief Complaint Blurred Vision   HPI Malery Pellerito Goldrick is a 50 y.o. female with past medical history of anemia, DM, HDL, breast cancer status postlumpectomy and stroke with residual right hemibody weakness and some spastic paresis of the right upper extremity as well as right-sided facial numbness and some expressive aphasia presents accompanied by her mother for assessment of some blurry vision she experienced this morning and some pain in her left hand she noticed.  She does not think they are related.  She states her vision feels completely back to normal.  She does wear glasses normally.  She states she never had double or painful vision.  She denies any headache or any new numbness, weakness or paresthesias.  She think she is able sore in her left hand from overusing it as she uses it to do most of her daily activities and was lying on it earlier this morning.  She denies any recent fevers, chills, cough, nausea, vomiting, diarrhea, dysuria, rash or any other recent associated sick symptoms.  No other acute concerns at this time.  She does not wear any glasses.         Past Medical History:  Diagnosis Date   Anemia    Cancer ([place]) 2018   br cancer on left with lump and rad tx    Diabetes mellitus without complication ([place])    Family history of colonic polyps    H/O: CVA (cerebrovascular accident) 2012   a. cerebral edema, craniotomy, residual right upper & lower limb weakness   Heart murmur    High cholesterol    History of colon polyps    History of echocardiogram    a. 12/01/2013: EF 50-55%, nl global LV systolic function, mild MR, mildly increased LV posterior wall thickness   History of  kidney stones    History of stress test    a. 12/01/2013: no significant ischemia, no significant WMA, no EKG changes concerning for ischemia, EF 67%, low risk scan   Hx of seizure disorder    a. one episode 10 months after CVA, since then on Keppra    Hypothyroidism    Personal history of radiation therapy 2019   LEFT lumpectomy   Seizures ([place])    Stroke ([place]) 2012   Thyroid disease     Patient Active Problem List   Diagnosis Date Noted   Type 2 diabetes mellitus with hyperglycemia, without long-term current use of insulin ([place]) 03/09/2018   Recurrent major depressive disorder, in partial remission ([place]) 03/09/2018   H/O submandibular gland removal 12/15/2017   Elevated blood sugar level 07/08/2017   Ductal carcinoma in situ (DCIS) of left breast 01/18/2017   History of renal stone 01/15/2015   Ureteral stone 10/29/2014   Chest pain 08/21/2014   Malaise and fatigue 08/21/2014   Morbid obesity ([employer]) 08/21/2014   H/O: CVA (cerebrovascular accident)    Hx of seizure disorder    History of echocardiogram    History of stress test    Heart murmur    Thyroid disease    Clinical depression 12/12/2013   Vitamin D deficiency 12/12/2013   Spastic hemiplegia affecting dominant side ([place]) 05/13/2011   Hemiplegia as late effect of cerebrovascular disease ([place]) 11/18/2010  Past Surgical History:  Procedure Laterality Date   BRAIN SURGERY     decompression after stroke   BREAST BIOPSY Left 12/13/2016   path pending   BREAST LUMPECTOMY Left 01/06/2017   DUCTAL CARCINOMA IN SITU, NUCLEAR GRADE 3.   BREAST LUMPECTOMY WITH NEEDLE LOCALIZATION Left 01/06/2017   Procedure: BREAST LUMPECTOMY WITH NEEDLE LOCALIZATION;  Surgeon: Leonie [place], MD;  Location: [facility] ORS;  Service: General;  Laterality: Left;   COLONOSCOPY WITH PROPOFOL N/A 11/18/2014   Procedure: COLONOSCOPY WITH PROPOFOL;  Surgeon: Josefine Class, MD;  Location: [facility] ENDOSCOPY;  Service: Endoscopy;  Laterality:  N/A;   COLONOSCOPY WITH PROPOFOL N/A 08/19/2020   Procedure: COLONOSCOPY WITH PROPOFOL;  Surgeon: Lesly Rubenstein, MD;  Location: [facility] ENDOSCOPY;  Service: Endoscopy;  Laterality: N/A;   CYSTOSCOPY/RETROGRADE/URETEROSCOPY/STONE EXTRACTION WITH BASKET Right 10/29/2014   Procedure: CYSTOSCOPY/URETEROSCOPY/STONE EXTRACTION WITH BASKET;  Surgeon: Franchot Gallo, MD;  Location: [facility] ORS;  Service: Urology;  Laterality: Right;   ESOPHAGOGASTRODUODENOSCOPY (EGD) WITH PROPOFOL N/A 11/18/2014   Procedure: ESOPHAGOGASTRODUODENOSCOPY (EGD) WITH PROPOFOL;  Surgeon: Josefine Class, MD;  Location: [facility] ENDOSCOPY;  Service: Endoscopy;  Laterality: N/A;   RE-EXCISION OF BREAST LUMPECTOMY Left 01/25/2017   Procedure: RE-EXCISION OF BREAST LUMPECTOMY;  Surgeon: Leonie [place], MD;  Location: [facility] ORS;  Service: General;  Laterality: Left;   SUBMANDIBULAR GLAND EXCISION Right 12/15/2017   Procedure: EXCISION SUBMANDIBULAR GLAND;  Surgeon: Carloyn Manner, MD;  Location: [facility] ORS;  Service: ENT;  Laterality: Right;   TONSILLECTOMY      Prior to Admission medications   Medication Sig Start Date End Date Taking? Authorizing Provider  aspirin EC 81 MG tablet Take 81 mg by mouth daily.    [provider]  atorvastatin (LIPITOR) 40 MG tablet Take 1 tablet (40 mg total) by mouth daily. 06/21/14   Dunn, Areta Haber, PA-C  baclofen (LIORESAL) 10 MG tablet Take 5 mg by mouth 2 (two) times daily. 04/10/20   [provider]  FLUoxetine (PROZAC) 40 MG capsule Take 40 mg by mouth daily.    [provider]  levETIRAcetam (KEPPRA) 750 MG tablet Take 1 tablet (750 mg total) by mouth 2 (two) times daily. 08/21/14   Minna Merritts, MD  levothyroxine (SYNTHROID, LEVOTHROID) 137 MCG tablet Take 137 mcg daily before breakfast by mouth.  10/29/16   [provider]  metFORMIN (GLUCOPHAGE) 500 MG tablet Take 500 mg by mouth daily with breakfast.    [provider]  ondansetron  (ZOFRAN) 4 MG tablet Take 1 tablet (4 mg total) by mouth every 4 (four) hours as needed for nausea. 12/16/17   Carloyn Manner, MD    Allergies Patient has no known allergies.  Family History  Problem Relation Age of Onset   Nephrolithiasis Mother    Non-Hodgkin's lymphoma Mother 20       currently 40   Heart disease Father    CVA Father    Nephrolithiasis Maternal Grandfather    Diabetes Maternal Grandfather    Pancreatic cancer Maternal Grandfather 48       deceased 29   Breast cancer Sister 43       negative genetic testing; currently 64   Lymphoma Maternal Grandmother        deceased 83   Breast cancer Paternal Aunt        35 more paternal aunts with breast cancer dx 60s-70s   Breast cancer Cousin        2 paternal cousins; daughters of pat aunt  w/ BC at 75   Colon cancer Cousin 22       son of mat aunt   Breast cancer Paternal Aunt 45       deceased 59 of MI    Social History Social History   Tobacco Use   Smoking status: Former    Packs/day: 1.00    Years: 15.00    Pack years: 15.00    Types: Cigarettes    Quit date: 12/30/2011    Years since quitting: 8.8   Smokeless tobacco: Never  Vaping Use   Vaping Use: Never used  Substance Use Topics   Alcohol use: No   Drug use: No    Review of Systems  Review of Systems  Constitutional:  Negative for chills and fever.  HENT:  Negative for sore throat.   Eyes:  Negative for pain.  Respiratory:  Negative for cough and stridor.   Cardiovascular:  Negative for chest pain.  Gastrointestinal:  Negative for vomiting.  Genitourinary:  Negative for dysuria.  Musculoskeletal:  Positive for myalgias (L hand).  Skin:  Negative for rash.  Neurological:  Positive for speech change (chronic) and weakness (chronic in RUE and RLE). Negative for seizures, loss of consciousness and headaches. Sensory change: chronic decreased sensation in R face and RUE and RLE. Psychiatric/Behavioral:  Negative for suicidal ideas.   All  other systems reviewed and are negative.    ____________________________________________   PHYSICAL EXAM:  VITAL SIGNS: ED Triage Vitals [10/16/20 1206]  Enc Vitals Group     BP 113/89     Pulse Rate 86     Resp 20     Temp 98 F (36.7 C)     Temp Source Oral     SpO2 97 %     Weight 200 lb (90.7 kg)     Height '5\' 4"'$  (1.626 m)     Head Circumference      Peak Flow      Pain Score 4     Pain Loc      Pain Edu?      Excl. in [place]?    Vitals:   10/16/20 1206  BP: 113/89  Pulse: 86  Resp: 20  Temp: 98 F (36.7 C)  SpO2: 97%   Physical Exam Vitals and nursing note reviewed.  Constitutional:      General: She is not in acute distress.    Appearance: She is well-developed. She is obese.  HENT:     Head: Normocephalic and atraumatic.     Right Ear: External ear normal.     Left Ear: External ear normal.     Nose: Nose normal.     Mouth/Throat:     Mouth: Mucous membranes are moist.  Eyes:     Conjunctiva/sclera: Conjunctivae normal.  Cardiovascular:     Rate and Rhythm: Normal rate and regular rhythm.     Heart sounds: No murmur heard. Pulmonary:     Effort: Pulmonary effort is normal. No respiratory distress.     Breath sounds: Normal breath sounds.  Abdominal:     Palpations: Abdomen is soft.     Tenderness: There is no abdominal tenderness.  Musculoskeletal:     Cervical back: Neck supple.  Skin:    General: Skin is warm and dry.  Neurological:     Mental Status: She is alert and oriented to person, place, and time.     Motor: Weakness present.  Psychiatric:        Mood  and Affect: Mood normal.    Exception of sensation on the right face in all distributions of cranial nerve V cranial nerves II through XII are grossly intact.  Visual acuity is 20/40 bilaterally pupils are unremarkable.  She has full strength on her left upper extremity and left lower extremity.  She has decree strength in her right upper extremity and right lower extremity.  Her right  hand is contracted which is baseline.  She has decree sensation to light touch in the right arm and right leg compared to the left which is also baseline.  Sensation is intact in the left upper extremity distribution of the radial ulnar and median nerves.  There is no effusion, erythema, induration edema or other overlying skin changes over the palm, wrist or forearm.  There is mildly tender over the distribution of the median nerve. ____________________________________________   LABS (all labs ordered are listed, but only abnormal results are displayed)  Labs Reviewed  COMPREHENSIVE METABOLIC PANEL - Abnormal; Notable for the following components:      Result Value   Glucose, Bld 213 (*)    Calcium 8.8 (*)    All other components within normal limits  PROTIME-INR  APTT  CBC  DIFFERENTIAL  POC URINE PREG, ED   ____________________________________________  EKG  Sinus rhythm with ventricular rate of 84, normal axis, unremarkable intervals without clearance of acute ischemia or significant arrhythmia. ____________________________________________  RADIOLOGY  ED MD interpretation: CT head shows no evidence of acute intracranial process or subacute CVA.  There is evidence of remote encephalomalacia from previous MCA territory infarct in left frontal lobe.  Patient is status post left frontal craniotomy.    Official radiology report(s): CT HEAD WO CONTRAST  Result Date: 10/16/2020 CLINICAL DATA:  Blurred vision, numbness in left arm, history of stroke with right-sided deficit, aphasia EXAM: CT HEAD WITHOUT CONTRAST TECHNIQUE: Contiguous axial images were obtained from the base of the skull through the vertex without intravenous contrast. COMPARISON:  CT brain, 07/29/2017 FINDINGS: Brain: No evidence of acute infarction, hemorrhage, hydrocephalus, extra-axial collection or mass lesion/mass effect. Redemonstrated encephalomalacia of the MCA territory in the left frontal lobe (series 2, image  16). Vascular: No hyperdense vessel or unexpected calcification. Skull: Status post prior left frontal craniotomy. Negative for fracture or focal lesion. Sinuses/Orbits: No acute finding. Other: None. IMPRESSION: 1. No acute intracranial pathology. 2. Redemonstrated encephalomalacia of the MCA territory in the left frontal lobe, in keeping with prior infarction. 3. Status post prior left frontal craniotomy. Electronically Signed   By: Eddie Candle M.D.   On: 10/16/2020 13:24    ____________________________________________   PROCEDURES  Procedure(s) performed (including Critical Care):  Procedures   ____________________________________________   INITIAL IMPRESSION / ASSESSMENT AND PLAN / ED COURSE      Patient presents with above-stated history exam presence of 2 concerns.  First she states she had some blurry vision this morning both eyes as well as some discomfort in her left palm which she attributes to overuse.  On arrival she is afebrile hemodynamically stable.  On exam she has evidence of some neurodeficits all chronic per patient without any evidence of acute neurological deficit.  Visual acuity she says is not baseline and is 20/40 bilaterally.  No associated double vision painful vision dilated pupils or other abnormal factors to suggest acute angle-closure glaucoma.  There is no evidence of deep space infection on exam in the head or neck.  CT head shows no evidence of acute intracranial process or  subacute CVA.  There is evidence of remote encephalomalacia from previous MCA territory infarct in left frontal lobe.  Patient is status post left frontal craniotomy.    Overall have a low suspicion for acute CVA given absence of any objective acute neurological deficits.  EKG is unremarkable.  CBC without leukocytosis or acute anemia.  CMP remarkable for mild hyperglycemia with a glucose of 213 without any other significant electrolyte or metabolic derangements.  Certainly possible  patient had some transient blurry vision related to hyperglycemia possibly more elevated earlier this morning.  However given she states her vision is now back to normal with low suspicion for stroke or other immediate life-threatening process I think she is stable for discharge with close outpatient ophthalmology follow-up.  She states he does have an eye doctor and will schedule follow-up with her physician.  Regard to her soreness in her left hand no history or exam features to suggest recent trauma or clear acute infectious process.  Suspect possible tendinitis versus neuropathy from some overuse.  We will give a dose of Tylenol and advise close outpatient PCP follow-up.  Patient discharged stable condition.  Strict return precautions advised discussed.  Also advised to have her PCP recheck her sugar and it was elevated today and she was recently started on metformin.     ____________________________________________   FINAL CLINICAL IMPRESSION(S) / ED DIAGNOSES  Final diagnoses:  Blurry vision, bilateral  Left hand pain  Hyperglycemia    Medications  acetaminophen (TYLENOL) tablet 1,000 mg (has no administration in time range)     ED Discharge Orders     None        Note:  This document was prepared using Dragon voice recognition software and may include unintentional dictation errors.    Lucrezia Starch, MD 10/16/20 1450

## 2021-02-03 DIAGNOSIS — R443 Hallucinations, unspecified: Secondary | ICD-10-CM | POA: Diagnosis present

## 2021-02-24 ENCOUNTER — Encounter: Payer: Self-pay | Admitting: *Deleted

## 2021-02-24 ENCOUNTER — Inpatient Hospital Stay
Admission: AD | Admit: 2021-02-24 | Discharge: 2021-02-26 | DRG: 885 | Disposition: A | Discharge status: Home / Self Care | Payer: Medicare Other | Source: Intra-hospital | Attending: Psychiatry | Admitting: Psychiatry

## 2021-02-24 ENCOUNTER — Inpatient Hospital Stay: Admission: RE | Admit: 2021-02-24 | Payer: Medicare Other | Source: Home / Self Care | Admitting: Psychiatry

## 2021-02-24 ENCOUNTER — Encounter: Payer: Self-pay | Admitting: Psychiatry

## 2021-02-24 ENCOUNTER — Other Ambulatory Visit: Payer: Self-pay

## 2021-02-24 ENCOUNTER — Emergency Department (EMERGENCY_DEPARTMENT_HOSPITAL)
Admission: EM | Admit: 2021-02-24 | Discharge: 2021-02-24 | Disposition: A | Discharge status: Other Acute Inpatient Hospital | Payer: Medicare Other | Source: Home / Self Care | Attending: Emergency Medicine | Admitting: Emergency Medicine

## 2021-02-24 DIAGNOSIS — I69959 Hemiplegia and hemiparesis following unspecified cerebrovascular disease affecting unspecified side: Secondary | ICD-10-CM

## 2021-02-24 DIAGNOSIS — E119 Type 2 diabetes mellitus without complications: Secondary | ICD-10-CM | POA: Diagnosis present

## 2021-02-24 DIAGNOSIS — E039 Hypothyroidism, unspecified: Secondary | ICD-10-CM | POA: Insufficient documentation

## 2021-02-24 DIAGNOSIS — G4700 Insomnia, unspecified: Secondary | ICD-10-CM | POA: Diagnosis present

## 2021-02-24 DIAGNOSIS — F22 Delusional disorders: Secondary | ICD-10-CM | POA: Diagnosis not present

## 2021-02-24 DIAGNOSIS — R440 Auditory hallucinations: Secondary | ICD-10-CM | POA: Diagnosis not present

## 2021-02-24 DIAGNOSIS — I10 Essential (primary) hypertension: Secondary | ICD-10-CM | POA: Diagnosis present

## 2021-02-24 DIAGNOSIS — Z7984 Long term (current) use of oral hypoglycemic drugs: Secondary | ICD-10-CM

## 2021-02-24 DIAGNOSIS — G40909 Epilepsy, unspecified, not intractable, without status epilepticus: Secondary | ICD-10-CM | POA: Diagnosis present

## 2021-02-24 DIAGNOSIS — E079 Disorder of thyroid, unspecified: Secondary | ICD-10-CM | POA: Diagnosis present

## 2021-02-24 DIAGNOSIS — F29 Unspecified psychosis not due to a substance or known physiological condition: Secondary | ICD-10-CM | POA: Diagnosis present

## 2021-02-24 DIAGNOSIS — Z20822 Contact with and (suspected) exposure to covid-19: Secondary | ICD-10-CM | POA: Insufficient documentation

## 2021-02-24 DIAGNOSIS — Z86010 Personal history of colonic polyps: Secondary | ICD-10-CM | POA: Insufficient documentation

## 2021-02-24 DIAGNOSIS — F23 Brief psychotic disorder: Secondary | ICD-10-CM

## 2021-02-24 DIAGNOSIS — Z79899 Other long term (current) drug therapy: Secondary | ICD-10-CM | POA: Insufficient documentation

## 2021-02-24 DIAGNOSIS — Z8669 Personal history of other diseases of the nervous system and sense organs: Secondary | ICD-10-CM

## 2021-02-24 DIAGNOSIS — Z87891 Personal history of nicotine dependence: Secondary | ICD-10-CM

## 2021-02-24 DIAGNOSIS — Z7982 Long term (current) use of aspirin: Secondary | ICD-10-CM | POA: Insufficient documentation

## 2021-02-24 DIAGNOSIS — R443 Hallucinations, unspecified: Secondary | ICD-10-CM | POA: Diagnosis present

## 2021-02-24 DIAGNOSIS — E1165 Type 2 diabetes mellitus with hyperglycemia: Secondary | ICD-10-CM | POA: Insufficient documentation

## 2021-02-24 DIAGNOSIS — I69951 Hemiplegia and hemiparesis following unspecified cerebrovascular disease affecting right dominant side: Secondary | ICD-10-CM | POA: Diagnosis not present

## 2021-02-24 DIAGNOSIS — Z8673 Personal history of transient ischemic attack (TIA), and cerebral infarction without residual deficits: Secondary | ICD-10-CM

## 2021-02-24 DIAGNOSIS — Z853 Personal history of malignant neoplasm of breast: Secondary | ICD-10-CM | POA: Insufficient documentation

## 2021-02-24 LAB — ETHANOL: Alcohol, Ethyl (B): <10 mg/dL (ref <10)

## 2021-02-24 LAB — URINE DRUG SCREEN, QUALITATIVE (ARMC ONLY)
Amphetamines, Ur Screen: NOT DETECTED
Barbiturates, Ur Screen: NOT DETECTED
Benzodiazepine, Ur Scrn: POSITIVE — AB
Cannabinoid 50 Ng, Ur ~~LOC~~: NOT DETECTED
Cocaine Metabolite,Ur ~~LOC~~: NOT DETECTED
MDMA (Ecstasy)Ur Screen: NOT DETECTED
Methadone Scn, Ur: NOT DETECTED
Opiate, Ur Screen: NOT DETECTED
Phencyclidine (PCP) Ur S: NOT DETECTED
Tricyclic, Ur Screen: POSITIVE — AB

## 2021-02-24 LAB — RESP PANEL BY RT-PCR (FLU A&B, COVID) ARPGX2
Influenza A by PCR: NEGATIVE
Influenza B by PCR: NEGATIVE
SARS Coronavirus 2 by RT PCR: NEGATIVE

## 2021-02-24 LAB — COMPREHENSIVE METABOLIC PANEL
ALT: 18 U/L (ref 0–44)
AST: 19 U/L (ref 15–41)
Albumin: 4.2 g/dL (ref 3.5–5.0)
Alkaline Phosphatase: 85 U/L (ref 38–126)
Anion gap: 10 (ref 5–15)
BUN: 15 mg/dL (ref 6–20)
CO2: 23 mmol/L (ref 22–32)
Calcium: 8.8 mg/dL — ABNORMAL LOW (ref 8.9–10.3)
Chloride: 102 mmol/L (ref 98–111)
Creatinine, Ser: 0.79 mg/dL (ref 0.44–1.00)
GFR, Estimated: >60 mL/min (ref >60)
Glucose, Bld: 182 mg/dL — ABNORMAL HIGH (ref 70–99)
Potassium: 3.4 mmol/L — ABNORMAL LOW (ref 3.5–5.1)
Sodium: 135 mmol/L (ref 135–145)
Total Bilirubin: 0.7 mg/dL (ref 0.3–1.2)
Total Protein: 7.2 g/dL (ref 6.5–8.1)

## 2021-02-24 LAB — CBC
HCT: 39.3 % (ref 36.0–46.0)
Hemoglobin: 13.4 g/dL (ref 12.0–15.0)
MCH: 29.4 pg (ref 26.0–34.0)
MCHC: 34.1 g/dL (ref 30.0–36.0)
MCV: 86.2 fL (ref 80.0–100.0)
Platelets: 277 10*3/uL (ref 150–400)
RBC: 4.56 MIL/uL (ref 3.87–5.11)
RDW: 13.2 % (ref 11.5–15.5)
WBC: 12.4 10*3/uL — ABNORMAL HIGH (ref 4.0–10.5)
nRBC: 0 % (ref 0.0–0.2)

## 2021-02-24 LAB — SALICYLATE LEVEL: Salicylate Lvl: <7 mg/dL — ABNORMAL LOW (ref 7.0–30.0)

## 2021-02-24 LAB — POC URINE PREG, ED: Preg Test, Ur: NEGATIVE

## 2021-02-24 LAB — ACETAMINOPHEN LEVEL: Acetaminophen (Tylenol), Serum: <10 ug/mL — ABNORMAL LOW (ref 10–30)

## 2021-02-24 MED ORDER — FLUOXETINE HCL 20 MG PO CAPS
40.0000 mg | ORAL_CAPSULE | Freq: Every day | ORAL | Status: DC
  Administered 2021-02-24: 40 mg via ORAL
  Filled 2021-02-24: qty 2

## 2021-02-24 MED ORDER — LOSARTAN POTASSIUM 50 MG PO TABS
50.0000 mg | ORAL_TABLET | Freq: Every day | ORAL | Status: DC
  Administered 2021-02-24: 50 mg via ORAL
  Filled 2021-02-24: qty 1

## 2021-02-24 MED ORDER — ATORVASTATIN CALCIUM 20 MG PO TABS
40.0000 mg | ORAL_TABLET | Freq: Every day | ORAL | Status: DC
  Administered 2021-02-24: 40 mg via ORAL
  Filled 2021-02-24: qty 2

## 2021-02-24 MED ORDER — LEVOTHYROXINE SODIUM 137 MCG PO TABS
137.0000 ug | ORAL_TABLET | Freq: Every day | ORAL | Status: DC
  Administered 2021-02-25: 137 ug via ORAL
  Filled 2021-02-24: qty 1

## 2021-02-24 MED ORDER — ATORVASTATIN CALCIUM 20 MG PO TABS
40.0000 mg | ORAL_TABLET | Freq: Every day | ORAL | Status: DC
  Administered 2021-02-25 – 2021-02-26 (×2): 40 mg via ORAL
  Filled 2021-02-24 (×2): qty 2

## 2021-02-24 MED ORDER — METFORMIN HCL 500 MG PO TABS
500.0000 mg | ORAL_TABLET | Freq: Every day | ORAL | Status: DC
  Administered 2021-02-24: 500 mg via ORAL
  Filled 2021-02-24: qty 1

## 2021-02-24 MED ORDER — MAGNESIUM HYDROXIDE 400 MG/5ML PO SUSP: 30.0000 mL | Freq: Every day | ORAL | Status: DC | PRN

## 2021-02-24 MED ORDER — LEVOTHYROXINE SODIUM 137 MCG PO TABS
137.0000 ug | ORAL_TABLET | Freq: Every day | ORAL | Status: DC
  Administered 2021-02-24: 137 ug via ORAL
  Filled 2021-02-24 (×2): qty 1

## 2021-02-24 MED ORDER — ACETAMINOPHEN 325 MG PO TABS
650.0000 mg | ORAL_TABLET | Freq: Four times a day (QID) | ORAL | Status: DC | PRN
  Administered 2021-02-25: 650 mg via ORAL
  Filled 2021-02-24: qty 2

## 2021-02-24 MED ORDER — BACLOFEN 10 MG PO TABS
5.0000 mg | ORAL_TABLET | Freq: Two times a day (BID) | ORAL | Status: DC
  Administered 2021-02-24: 5 mg via ORAL
  Filled 2021-02-24 (×2): qty 0.5

## 2021-02-24 MED ORDER — LOSARTAN POTASSIUM 50 MG PO TABS
50.0000 mg | ORAL_TABLET | Freq: Every day | ORAL | Status: DC
  Administered 2021-02-25 – 2021-02-26 (×2): 50 mg via ORAL
  Filled 2021-02-24 (×2): qty 1

## 2021-02-24 MED ORDER — FLUOXETINE HCL 20 MG PO CAPS
40.0000 mg | ORAL_CAPSULE | Freq: Every day | ORAL | Status: DC
  Administered 2021-02-25 – 2021-02-26 (×2): 40 mg via ORAL
  Filled 2021-02-24 (×2): qty 2

## 2021-02-24 MED ORDER — ASPIRIN EC 81 MG PO TBEC
81.0000 mg | DELAYED_RELEASE_TABLET | Freq: Every day | ORAL | Status: DC
  Administered 2021-02-25 – 2021-02-26 (×2): 81 mg via ORAL
  Filled 2021-02-24 (×2): qty 1

## 2021-02-24 MED ORDER — BACLOFEN 10 MG PO TABS
5.0000 mg | ORAL_TABLET | Freq: Two times a day (BID) | ORAL | Status: DC
  Administered 2021-02-25 – 2021-02-26 (×3): 5 mg via ORAL
  Filled 2021-02-24 (×6): qty 0.5

## 2021-02-24 MED ORDER — METFORMIN HCL 500 MG PO TABS
500.0000 mg | ORAL_TABLET | Freq: Every day | ORAL | Status: DC
  Administered 2021-02-25 – 2021-02-26 (×2): 500 mg via ORAL
  Filled 2021-02-24 (×2): qty 1

## 2021-02-24 MED ORDER — LEVETIRACETAM 750 MG PO TABS
750.0000 mg | ORAL_TABLET | Freq: Two times a day (BID) | ORAL | Status: DC
  Administered 2021-02-25: 750 mg via ORAL
  Filled 2021-02-24 (×4): qty 1

## 2021-02-24 MED ORDER — ASPIRIN EC 81 MG PO TBEC
81.0000 mg | DELAYED_RELEASE_TABLET | Freq: Every day | ORAL | Status: DC
  Administered 2021-02-24: 81 mg via ORAL
  Filled 2021-02-24: qty 1

## 2021-02-24 MED ORDER — ALUM & MAG HYDROXIDE-SIMETH 200-200-20 MG/5ML PO SUSP: 30.0000 mL | ORAL | Status: DC | PRN

## 2021-02-24 MED ORDER — LEVETIRACETAM 750 MG PO TABS
750.0000 mg | ORAL_TABLET | Freq: Two times a day (BID) | ORAL | Status: DC
  Administered 2021-02-24: 750 mg via ORAL
  Filled 2021-02-24 (×2): qty 1

## 2021-02-24 NOTE — ED Notes (Signed)
IVC/Pending consult

## 2021-02-24 NOTE — ED Provider Notes (Signed)
[facility] Provider Note    Event Date/Time   First MD Initiated Contact with Patient 02/24/21 0510     (approximate)   History   Psychiatric Evaluation   HPI  Melissa Day is a 51 y.o. female brought to the ED from home by her mother with a chief complaint of paranoia.  Mother states patient has been having paranoia and hallucinations over the past year but tonight it escalated.  Patient presents tearful and frightened.  Denies active SI/HI.     Past Medical History   Past Medical History:  Diagnosis Date   Anemia    Cancer ([place]) 2018   br cancer on left with lump and rad tx    Diabetes mellitus without complication ([place])    Family history of colonic polyps    H/O: CVA (cerebrovascular accident) 2012   a. cerebral edema, craniotomy, residual right upper & lower limb weakness   Heart murmur    High cholesterol    History of colon polyps    History of echocardiogram    a. 12/01/2013: EF 50-55%, nl global LV systolic function, mild MR, mildly increased LV posterior wall thickness   History of kidney stones    History of stress test    a. 12/01/2013: no significant ischemia, no significant WMA, no EKG changes concerning for ischemia, EF 67%, low risk scan   Hx of seizure disorder    a. one episode 10 months after CVA, since then on Keppra    Hypothyroidism    Personal history of radiation therapy 2019   LEFT lumpectomy   Seizures ([place])    Stroke ([place]) 2012   Thyroid disease      Active Problem List   Patient Active Problem List   Diagnosis Date Noted   Type 2 diabetes mellitus with hyperglycemia, without long-term current use of insulin ([place]) 03/09/2018   Recurrent major depressive disorder, in partial remission ([place]) 03/09/2018   H/O submandibular gland removal 12/15/2017   Elevated blood sugar level 07/08/2017   Ductal carcinoma in situ (DCIS) of left breast 01/18/2017   History of renal stone 01/15/2015   Ureteral stone  10/29/2014   Chest pain 08/21/2014   Malaise and fatigue 08/21/2014   Morbid obesity ([place]) 08/21/2014   H/O: CVA (cerebrovascular accident)    Hx of seizure disorder    History of echocardiogram    History of stress test    Heart murmur    Thyroid disease    Clinical depression 12/12/2013   Vitamin D deficiency 12/12/2013   Spastic hemiplegia affecting dominant side ([place]) 05/13/2011   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   BREAST LUMPECTOMY WITH NEEDLE LOCALIZATION Left 01/06/2017   Procedure: BREAST LUMPECTOMY WITH NEEDLE LOCALIZATION;  Surgeon: Leonie Green, MD;  Location: [facility] ORS;  Service: General;  Laterality: Left;   COLONOSCOPY WITH PROPOFOL N/A 11/18/2014   Procedure: COLONOSCOPY WITH PROPOFOL;  Surgeon: Josefine Class, MD;  Location: [facility] ENDOSCOPY;  Service: Endoscopy;  Laterality: N/A;   COLONOSCOPY WITH PROPOFOL N/A 08/19/2020   Procedure: COLONOSCOPY WITH PROPOFOL;  Surgeon: Lesly Rubenstein, MD;  Location: [facility] ENDOSCOPY;  Service: Endoscopy;  Laterality:  N/A;   CYSTOSCOPY/RETROGRADE/URETEROSCOPY/STONE EXTRACTION WITH BASKET Right 10/29/2014   Procedure: CYSTOSCOPY/URETEROSCOPY/STONE EXTRACTION WITH BASKET;  Surgeon: Franchot Gallo, MD;  Location: [facility] ORS;  Service: Urology;  Laterality: Right;   ESOPHAGOGASTRODUODENOSCOPY (EGD) WITH PROPOFOL N/A 11/18/2014   Procedure: ESOPHAGOGASTRODUODENOSCOPY (EGD) WITH PROPOFOL;  Surgeon: Josefine Class, MD;  Location[facility] ENDOSCOPY;  Service: Endoscopy;  Laterality: N/A;   RE-EXCISION OF BREAST LUMPECTOMY Left 01/25/2017   Procedure: RE-EXCISION OF BREAST LUMPECTOMY;  Surgeon: Leonie Green, MD;  Location: [facility]  ORS;  Service: General;  Laterality: Left;   SUBMANDIBULAR GLAND EXCISION Right 12/15/2017   Procedure: EXCISION SUBMANDIBULAR GLAND;  Surgeon: Carloyn Manner, MD;  Location: [facility] ORS;  Service: ENT;  Laterality: Right;   TONSILLECTOMY       Home Medications   Prior to Admission medications   Medication Sig Start Date End Date Taking? Authorizing Provider  aspirin EC 81 MG tablet Take 81 mg by mouth daily.   Yes [provider]  atorvastatin (LIPITOR) 40 MG tablet Take 1 tablet (40 mg total) by mouth daily. 06/21/14  Yes Dunn, Areta Haber, PA-C  baclofen (LIORESAL) 10 MG tablet Take 5 mg by mouth 2 (two) times daily. 04/10/20  Yes [provider]  FLUoxetine (PROZAC) 40 MG capsule Take 40 mg by mouth daily.   Yes [provider]  levETIRAcetam (KEPPRA) 750 MG tablet Take 1 tablet (750 mg total) by mouth 2 (two) times daily. 08/21/14  Yes Minna Merritts, MD  levothyroxine (SYNTHROID, LEVOTHROID) 137 MCG tablet Take 137 mcg daily before breakfast by mouth.  10/29/16  Yes [provider]  losartan (COZAAR) 50 MG tablet Take 50 mg by mouth daily. 01/26/21  Yes [provider]  metFORMIN (GLUCOPHAGE) 500 MG tablet Take 500 mg by mouth daily with breakfast.   Yes [provider]     Allergies  Patient has no known allergies.   Family History   Family History  Problem Relation Age of Onset   Nephrolithiasis Mother    Non-Hodgkin's lymphoma Mother 42       currently 35   Heart disease Father    CVA Father    Nephrolithiasis Maternal Grandfather    Diabetes Maternal Grandfather    Pancreatic cancer Maternal Grandfather 58       deceased 21   Breast cancer Sister 32       negative genetic testing; currently 40   Lymphoma Maternal Grandmother        deceased 55   Breast cancer Paternal Aunt        54 more paternal aunts with breast cancer dx 64s-70s   Breast cancer Cousin        2 paternal cousins; daughters of pat aunt w/ BC at 33    Colon cancer Cousin 73       son of mat aunt   Breast cancer Paternal Aunt 29       deceased 86 of MI     Physical Exam  Triage Vital Signs: ED Triage Vitals  Enc Vitals Group     BP 02/24/21 0451 133/79     Pulse Rate 02/24/21 0451 62     Resp 02/24/21 0451 18     Temp 02/24/21 0451 99.5 F (37.5 C)     Temp Source 02/24/21 0451 Oral     SpO2 02/24/21 0451 99 %     Weight 02/24/21 0453 202 lb 13.2 oz (92 kg)     Height 02/24/21 0453 5\' 4"  (1.626  m)     Head Circumference --      Peak Flow --      Pain Score 02/24/21 0453 0     Pain Loc --      Pain Edu? --      Excl. in [place]? --     Updated Vital Signs: BP 133/79    Pulse 62    Temp 99.5 F (37.5 C) (Oral)    Resp 18    Ht 5\' 4"  (1.626 m)    Wt 92 kg    SpO2 99%    BMI 34.81 kg/m    General: Awake, mild distress.  CV:  Good peripheral perfusion.  Resp:  Normal effort.  Abd:  No distention.  Psychiatric: Tearful, paranoia    ED Results / Procedures / Treatments  Labs (all labs ordered are listed, but only abnormal results are displayed) Labs Reviewed  COMPREHENSIVE METABOLIC PANEL - Abnormal; Notable for the following components:      Result Value   Potassium 3.4 (*)    Glucose, Bld 182 (*)    Calcium 8.8 (*)    All other components within normal limits  SALICYLATE LEVEL - Abnormal; Notable for the following components:   Salicylate Lvl <6.2 (*)    All other components within normal limits  ACETAMINOPHEN LEVEL - Abnormal; Notable for the following components:   Acetaminophen (Tylenol), Serum <10 (*)    All other components within normal limits  CBC - Abnormal; Notable for the following components:   WBC 12.4 (*)    All other components within normal limits  URINE DRUG SCREEN, QUALITATIVE ([facility] ONLY) - Abnormal; Notable for the following components:   Tricyclic, Ur Screen POSITIVE (*)    Benzodiazepine, Ur Scrn POSITIVE (*)    All other components within normal limits  ETHANOL  POC URINE PREG, ED  POC  URINE PREG, ED     EKG  None   RADIOLOGY None   Official radiology report(s): No results found.   PROCEDURES:  Critical Care performed: No  Procedures   MEDICATIONS ORDERED IN ED: Medications - No data to display   IMPRESSION / MDM / [place] / ED COURSE  I reviewed the triage vital signs and the nursing notes.                             51 year old female who presents with paranoia. The patient has been placed in psychiatric observation due to the need to provide a safe environment for the patient while obtaining psychiatric consultation and evaluation, as well as ongoing medical and medication management to treat the patient's condition.  The patient has not been placed under full IVC at this time.  I have personally reviewed patient's charts and see her PCP office visit from 01/26/2021 for anxiety and hallucinations. Will consult psychiatry to evaluate.  Clinical Course as of 02/24/21 2297  Tue Feb 24, 2021  0611 Collateral information collected by TTS from patient's mother who is concerned patient is a danger to herself. Attempted to jump out of the moving vehicle en route to the ED. Given this new information will place patient under IVC. The patient has been placed in psychiatric observation due to the need to provide a safe environment for the patient while obtaining psychiatric consultation and evaluation, as well as ongoing medical and medication management to treat the patient's condition.  The patient has been placed under full IVC at  this time.   [JS]    Clinical Course User Index [JS] Paulette Blanch, MD     FINAL CLINICAL IMPRESSION(S) / ED DIAGNOSES   Final diagnoses:  Acute psychosis ([place])     Rx / DC Orders   ED Discharge Orders     None        Note:  This document was prepared using Dragon voice recognition software and may include unintentional dictation errors.   Paulette Blanch, MD 02/24/21 [phone]

## 2021-02-24 NOTE — ED Notes (Signed)
Pt. Alert and oriented, warm and dry, in no distress. Pt. Denies SI, HI, and AVH. Pt states hearing voices at home up stairs. Patient states has been taking all medications as prescribed and her boyfriend helps her with her medications. Patient states she just started hearing voices within the last 6 months and don't know why. Patient is tearful and appears frighten. Pt. Encouraged to let nursing staff know of any concerns or needs.

## 2021-02-24 NOTE — ED Notes (Signed)
Meal tray and drink given

## 2021-02-24 NOTE — ED Notes (Signed)
Pt. Alert and oriented, warm and dry, in no distress. Pt. Denies SI, HI, and AVH. Pt Pt. Encouraged to let nursing staff know of any concerns or needs.

## 2021-02-24 NOTE — BH Assessment (Signed)
Comprehensive Clinical Assessment (CCA) Note  02/24/2021 Melissa Day [id number] Recommendations for Services/Supports/Treatments: Psych consult/disposition pending.   Melissa Day is a 51 year old, English speaking, Caucasian female with a hx of paranoia, depression, and anxiety. Of note pt. has had a massive stroke and has associated articulation error. Pt presented to [facility] ED voluntarily, BIB mother but is slated to be IVC'd by EDP due to mother's concern for pt.'s safety. Pt was resting, in bed upon examiner's arrival and was visibly anxious. Pt was passive and suspicious throughout the assessment. Pt had flashes of insight as she initially identified her mother as the reason for her arrival but acknowledged that she is paranoid. Pt endorsed persecutory delusions, identifying her neighbors as her main stressor. Pt stated, It's hard to explain. Pt was preoccupied with concerns with her neighbors.Pt denied symptoms of depression. Pt is currently not connected to a therapist and reported that her meds are managed by her doctor. Pt reported that she complies with her medications. Pt's protective factors are having stable housing, family supports, and a live-in boyfriend. Pt is currently on disability and lives in a low-income housing apartment. The pt. presented with irrelevant and obsessive thought content. The pt. denied current SI/HI/AV/H. Pt denies substance use. Pt was oriented x4. Pt's mood was anxious; affect was congruent. The pt.'s anxiety impacted her ability to concentrate. Pt had normal psychomotor activity. The pt. did not appear to be responding to internal/external stimuli.  Collateral: [name]" (Mother/Legal Guardian) [phone] reported that the pt. is unreasonable and extremely paranoid. Mother explained that the pt. is obsessed with her neighbors and believe that they are spying on her via having her apartment bugged. Mother reported that the pt. believes  that her neighbors are talking about her. Mother reported that the pt.'s delusions are worsening, evidenced by her showering in the dark and getting dressed in the hallway due to a belief that her neighbors are watching her. Mother explained that the pt. rarely sleeps. Mother reported that the pt. screamed all the way to the hospital and had to be forced into the car after attempting to go her neighbor's house to confront them for talking about her. Mother reported that the pt attempted to open the car door and jump out while on the way. Mother explained that the only reason that they were able to get the pt. to the ED is because she believed that they were going to the police station to have something done to the neighbors. Mother explained that this all began about 1 year ago; but red flags became apparent to the family around 6 months ago. Mother reported that she'd believed the pt initially because her neighbors are substance abusers and unsavory. Mother explained that the pt's stories became more and more bizarre which lead them to believe she does need help. Mother reported that the pt was taken to [facility] about 1 month ago but she was discharged as she was deemed to be not a danger to herself or others.   Chief Complaint:  Chief Complaint  Patient presents with   Psychiatric Evaluation   Visit Diagnosis: Psychosis    CCA Screening, Triage and Referral (STR)  Patient Reported Information How did you hear about [place]? Family/Friend  Referral name: No data recorded Referral phone number: No data recorded  Whom do you see for routine medical problems? No data recorded Practice/Facility Name: No data recorded Practice/Facility Phone Number: No data recorded Name of Contact: No data recorded  Contact Number: No data recorded Contact Fax Number: No data recorded Prescriber Name: No data recorded Prescriber Address (if known): No data recorded  What Is the Reason for Your Visit/Call Today?  Pt presented to the ED with worsening symptoms of paranoia.  How Long Has This Been Causing You Problems? > than 6 months  What Do You Feel Would Help You the Most Today? Treatment for Depression or other mood problem   Have You Recently Been in Any Inpatient Treatment (Hospital/Detox/Crisis Center/28-Day Program)? No data recorded Name/Location of Program/Hospital:No data recorded How Long Were You There? No data recorded When Were You Discharged? No data recorded  Have You Ever Received Services From [facility] Before? No data recorded Who Do You See at [facility]? No data recorded  Have You Recently Had Any Thoughts About Hurting Yourself? No  Are You Planning to Commit Suicide/Harm Yourself At This time? No   Have you Recently Had Thoughts About [place]? No  Explanation: No data recorded  Have You Used Any Alcohol or Drugs in the Past 24 Hours? No  How Long Ago Did You Use Drugs or Alcohol? No data recorded What Did You Use and How Much? No data recorded  Do You Currently Have a Therapist/Psychiatrist? No  Name of Therapist/Psychiatrist: No data recorded  Have You Been Recently Discharged From Any Office Practice or Programs? No  Explanation of Discharge From Practice/Program: No data recorded    CCA Screening Triage Referral Assessment Type of Contact: Face-to-Face  Is this Initial or Reassessment? No data recorded Date Telepsych consult ordered in [facility]:  No data recorded Time Telepsych consult ordered in [facility]:  No data recorded  Patient Reported Information Reviewed? No data recorded Patient Left Without Being Seen? No data recorded Reason for Not Completing Assessment: No data recorded  Collateral Involvement: Legal Guardian/ Ellyn Hack (mom) [phone]   Does Patient Have a Court Appointed Legal Guardian? No data recorded Name and Contact of Legal Guardian: No data recorded If Minor and Not Living with Parent(s), Who has Custody? n/a  Is  CPS involved or ever been involved? Never  Is APS involved or ever been involved? Never   Patient Determined To Be At Risk for Harm To Self or Others Based on Review of Patient Reported Information or Presenting Complaint? Yes, for Self-Harm  Method: No data recorded Availability of Means: No data recorded Intent: No data recorded Notification Required: No data recorded Additional Information for Danger to Others Potential: No data recorded Additional Comments for Danger to Others Potential: No data recorded Are There Guns or Other Weapons in Your Home? No data recorded Types of Guns/Weapons: No data recorded Are These Weapons Safely Secured?                            No data recorded Who Could Verify You Are Able To Have These Secured: No data recorded Do You Have any Outstanding Charges, Pending Court Dates, Parole/Probation? No data recorded Contacted To Inform of Risk of Harm To Self or Others: No data recorded  [facility] of Assessment[facility] ED   Does Patient Present under Involuntary Commitment? No  IVC Papers Initial File Date: No data recorded  [place] of Residence: [place]   Patient Currently Receiving the Following Services: Medication Management   Determination of Need: Emergent (2 hours)   Options For Referral: ED Referral; Therapeutic Triage Services     CCA Biopsychosocial Intake/Chief Complaint:  No  data recorded Current Symptoms/Problems: No data recorded  Patient Reported Schizophrenia/Schizoaffective Diagnosis in Past: No   Strengths: Pt has stable housing  Preferences: No data recorded Abilities: No data recorded  Type of Services Patient Feels are Needed: No data recorded  Initial Clinical Notes/Concerns: No data recorded  Mental Health Symptoms Depression:   None   Duration of Depressive symptoms: No data recorded  Mania:   Recklessness; Change in energy/activity   Anxiety:    Worrying; Tension; Irritability   Psychosis:    Delusions   Duration of Psychotic symptoms:  Greater than six months   Trauma:   N/A   Obsessions:   Cause anxiety; Recurrent & persistent thoughts/impulses/images; Attempts to suppress/neutralize; Intrusive/time consuming; Disrupts routine/functioning; Poor insight   Compulsions:   "Driven" to perform behaviors/acts; Absent insight/delusional; Intended to reduce stress or prevent another outcome; Intrusive/time consuming; Disrupts with routine/functioning; Repeated behaviors/mental acts   Inattention:   None   Hyperactivity/Impulsivity:   None   Oppositional/Defiant Behaviors:   Angry; Easily annoyed   Emotional Irregularity:   Potentially harmful impulsivity; Intense/inappropriate anger   Other Mood/Personality Symptoms:  No data recorded   Mental Status Exam Appearance and self-care  Stature:   Average   Weight:   Overweight   Clothing:   -- (In scrubs)   Grooming:   Neglected   Cosmetic use:   None   Posture/gait:   Normal   Motor activity:   Not Remarkable   Sensorium  Attention:   Distractible   Concentration:  No data recorded  Orientation:   Object; Person; Place; Situation   Recall/memory:   Normal   Affect and Mood  Affect:   Anxious   Mood:   Anxious   Relating  Eye contact:   Normal   Facial expression:   Anxious   Attitude toward examiner:   Passive; Guarded   Thought and Language  Speech flow:  Articulation error   Thought content:   Delusions; Persecutions   Preoccupation:   Obsessions   Hallucinations:   None   Organization:  No data recorded  [employer] of Knowledge:   Impoverished by (Comment) (Stroke)   Intelligence:   Average   Abstraction:   Functional   Judgement:   Poor   Reality Testing:   Distorted   Insight:   None/zero insight   Decision Making:   Impulsive   Social Functioning  Social Maturity:   Impulsive   Social Judgement:   Heedless   Stress   Stressors:   Other (Comment) (Current mental status)   Coping Ability:   Overwhelmed   Skill Deficits:   Self-control; Interpersonal; Decision making; Communication   Supports:   Family; Support needed; Friends/Service system     Religion: Religion/Spirituality Are You A Religious Person?:  Special educational needs teacher)  Leisure/Recreation: Leisure / Recreation Do You Have Hobbies?: No  Exercise/Diet: Exercise/Diet Do You Exercise?: No Have You Gained or Lost A Significant Amount of Weight in the Past Six Months?: No Do You Follow a Special Diet?: No Do You Have Any Trouble Sleeping?: Yes Explanation of Sleeping Difficulties: Per mother; pt rarely sleeps and only for a few hours.   CCA Employment/Education Employment/Work Situation: Employment / Work Technical sales engineer: On disability Why is Patient on Disability: Pt has had a massive stroke in the past. How Long has Patient Been on Disability: [place] Patient's Job has Been Impacted by Current Illness: No Has Patient ever Been in the [employer]?: No  Education: Education Is Patient  Currently Attending School?: No Last Grade Completed: 12 Did You Attend College?: No Did You Have An Individualized Education Program (IIEP): No Did You Have Any Difficulty At School?: No Patient's Education Has Been Impacted by Current Illness: No   CCA Family/Childhood History Family and Relationship History: Family history Marital status: Long term relationship Divorced, when?: over 10 years ago Long term relationship, how long?: 4 years What types of issues is patient dealing with in the relationship?: none Additional relationship information: Pt has a live in boyfriend who also provides care Does patient have children?: Yes How many children?: 1 How is patient's relationship with their children?: Distant  Childhood History:  Childhood History By whom was/is the patient raised?: Both parents Did patient suffer any  verbal/emotional/physical/sexual abuse as a child?: No Did patient suffer from severe childhood neglect?: No Has patient ever been sexually abused/assaulted/raped as an adolescent or adult?: No Was the patient ever a victim of a crime or a disaster?: No Witnessed domestic violence?: No Has patient been affected by domestic violence as an adult?: No  Child/Adolescent Assessment:     CCA Substance Use Alcohol/Drug Use: Alcohol / Drug Use Pain Medications: See MAR Prescriptions: See MAR Over the Counter: See MAR History of alcohol / drug use?: No history of alcohol / drug abuse                         ASAM's:  Six Dimensions of Multidimensional Assessment  Dimension 1:  Acute Intoxication and/or Withdrawal Potential:      Dimension 2:  Biomedical Conditions and Complications:      Dimension 3:  Emotional, Behavioral, or Cognitive Conditions and Complications:     Dimension 4:  Readiness to Change:     Dimension 5:  Relapse, Continued use, or Continued Problem Potential:     Dimension 6:  Recovery/Living Environment:     ASAM Severity Score:    ASAM Recommended Level of Treatment:     Substance use Disorder (SUD)    Recommendations for Services/Supports/Treatments:    DSM5 Diagnoses: Patient Active Problem List   Diagnosis Date Noted   Type 2 diabetes mellitus with hyperglycemia, without long-term current use of insulin ([place]) 03/09/2018   Recurrent major depressive disorder, in partial remission ([place]) 03/09/2018   H/O submandibular gland removal 12/15/2017   Elevated blood sugar level 07/08/2017   Ductal carcinoma in situ (DCIS) of left breast 01/18/2017   History of renal stone 01/15/2015   Ureteral stone 10/29/2014   Chest pain 08/21/2014   Malaise and fatigue 08/21/2014   Morbid obesity ([place]) 08/21/2014   H/O: CVA (cerebrovascular accident)    Hx of seizure disorder    History of echocardiogram    History of stress test    Heart murmur    Thyroid  disease    Clinical depression 12/12/2013   Vitamin D deficiency 12/12/2013   Spastic hemiplegia affecting dominant side ([place]) 05/13/2011   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

## 2021-02-24 NOTE — Plan of Care (Signed)
Patient new to the unit tonight, hasn't had time to progress  Problem: Education: Goal: Knowledge of [facility] Education information/materials will improve Outcome: Not Progressing Goal: Emotional status will improve Outcome: Not Progressing Goal: Mental status will improve Outcome: Not Progressing Goal: Verbalization of understanding the information provided will improve Outcome: Not Progressing   Problem: Safety: Goal: Periods of time without injury will increase Outcome: Not Progressing   Problem: Education: Goal: Utilization of techniques to improve thought processes will improve Outcome: Not Progressing Goal: Knowledge of the prescribed therapeutic regimen will improve Outcome: Not Progressing   Problem: Self-Concept: Goal: Will verbalize positive feelings about self Outcome: Not Progressing Goal: Level of anxiety will decrease Outcome: Not Progressing

## 2021-02-24 NOTE — ED Triage Notes (Signed)
Pt with paranoia. Pt is here with her mother. Pt lives with parents. Mom states this has progressed over the year but tonight it has been escalating. Pt is tearful and unsettled

## 2021-02-24 NOTE — Tx Team (Signed)
Initial Treatment Plan 02/24/2021 8:24 PM KEM PARCHER SJG:[id number]    PATIENT STRESSORS: Health problems   Medication change or noncompliance     PATIENT STRENGTHS: Motivation for treatment/growth  Supportive family/friends    PATIENT IDENTIFIED PROBLEMS: Audio Hallucinations  Depression  Anxiety                 DISCHARGE CRITERIA:  Motivation to continue treatment in a less acute level of care Verbal commitment to aftercare and medication compliance  PRELIMINARY DISCHARGE PLAN: Outpatient therapy Return to previous living arrangement  PATIENT/FAMILY INVOLVEMENT: This treatment plan has been presented to and reviewed with the patient, Melissa Day. The patient has been given the opportunity to ask questions and make suggestions.  Mallie Darting, RN 02/24/2021, 8:24 PM

## 2021-02-24 NOTE — Consult Note (Addendum)
[facility] Face-to-Face Psychiatry Consult   Reason for Consult:  delusions/hallucinations Referring Physician:  EDP Patient Identification: Melissa Day MRN:  [id number] Principal Diagnosis: Auditory hallucination Diagnosis:  Principal Problem:   Auditory hallucination   Total Time spent with patient: 1 hour  Subjective:   Melissa Day is a 51 y.o. female patient admitted with auditory hallucinations and paranoia.  HPI:  Patient seen face-to-face and chart reviewed.  Patient appears calm.  Patient is a little difficult to understand due to deficits from his stroke.  She is cooperative. Denies suicidal or homicidal ideations.  She does express great frustration at the neighbors "talking" to her through holes in the walls and floors.  She also states that they are watching her and have cameras stationed so that they can watch her shower.  Patient is insistent on that these voices are real and it does not matter that no one else can hear them.  Patient's boyfriend lives with her. Patient did attempt to go and confront the neighbors but was stopped by her boyfriend.  She has complained to the staff at the apartment office.  Patient expresses that her fears of the neighbors are very real.  Patient denies visual hallucinations.  Patient denies any use of illicit substances including alcohol or marijuana.  Her UDS was positive for benzodiazepines.  No indication that she is prescribed these and no indication that she was given any during this interview visit.  At [facility] visit patient was prescribed Seroquel; patient not taking.  Patient's symptoms are worse at night.  She describes very poor sleep.  This has been going on about 6 months and it is getting worse.  Recommend inpatient hospitalization for diagnostic clarity and treatment.    Collateral from mom [phone]) and boyfriend Virgina Jock, [phone]) : Both are very concerned for her safety.  "It is getting worse." Tried to open the door of  car on way here. Goes to the office at the supplemental housing office complaining of the people upstairs having access to her cell phone, watching her with cameras. patient insistent that these voices are real and that her neighbors are trying to harm or intimidate her.  Mother states that patient's boyfriend told her that patient will only shower in the dark now because she feels that neighbors can see her.  Patient is getting very little sleep.  Past Psychiatric History: Seen at [facility] outpatient and [facility] for same within the last few months.  Risk to Self:   Risk to Others:   Prior Inpatient Therapy:   Prior Outpatient Therapy:    Past Medical History:  Past Medical History:  Diagnosis Date   Anemia    Cancer ([place]) 2018   br cancer on left with lump and rad tx    Diabetes mellitus without complication ([place])    Family history of colonic polyps    H/O: CVA (cerebrovascular accident) 2012   a. cerebral edema, craniotomy, residual right upper & lower limb weakness   Heart murmur    High cholesterol    History of colon polyps    History of echocardiogram    a. 12/01/2013: EF 50-55%, nl global LV systolic function, mild MR, mildly increased LV posterior wall thickness   History of kidney stones    History of stress test    a. 12/01/2013: no significant ischemia, no significant WMA, no EKG changes concerning for ischemia, EF 67%, low risk scan   Hx of seizure disorder    a. one episode  10 months after CVA, since then on Keppra    Hypothyroidism    Personal history of radiation therapy 2019   LEFT lumpectomy   Seizures ([place])    Stroke ([place]) 2012   Thyroid disease     Past Surgical History:  Procedure Laterality Date   BRAIN SURGERY     decompression after stroke   BREAST BIOPSY Left 12/13/2016   path pending   BREAST LUMPECTOMY Left 01/06/2017   DUCTAL CARCINOMA IN SITU, NUCLEAR GRADE 3.   BREAST LUMPECTOMY WITH NEEDLE LOCALIZATION Left 01/06/2017   Procedure: BREAST LUMPECTOMY  WITH NEEDLE LOCALIZATION;  Surgeon: Leonie Green, MD;  Location: [facility] ORS;  Service: General;  Laterality: Left;   COLONOSCOPY WITH PROPOFOL N/A 11/18/2014   Procedure: COLONOSCOPY WITH PROPOFOL;  Surgeon: Josefine Class, MD;  Location: [facility] ENDOSCOPY;  Service: Endoscopy;  Laterality: N/A;   COLONOSCOPY WITH PROPOFOL N/A 08/19/2020   Procedure: COLONOSCOPY WITH PROPOFOL;  Surgeon: Lesly Rubenstein, MD;  Location: [facility] ENDOSCOPY;  Service: Endoscopy;  Laterality: N/A;   CYSTOSCOPY/RETROGRADE/URETEROSCOPY/STONE EXTRACTION WITH BASKET Right 10/29/2014   Procedure: CYSTOSCOPY/URETEROSCOPY/STONE EXTRACTION WITH BASKET;  Surgeon: Franchot Gallo, MD;  Location: [facility] ORS;  Service: Urology;  Laterality: Right;   ESOPHAGOGASTRODUODENOSCOPY (EGD) WITH PROPOFOL N/A 11/18/2014   Procedure: ESOPHAGOGASTRODUODENOSCOPY (EGD) WITH PROPOFOL;  Surgeon: Josefine Class, MD;  Location: [facility] ENDOSCOPY;  Service: Endoscopy;  Laterality: N/A;   RE-EXCISION OF BREAST LUMPECTOMY Left 01/25/2017   Procedure: RE-EXCISION OF BREAST LUMPECTOMY;  Surgeon: Leonie Green, MD;  Location: [facility] ORS;  Service: General;  Laterality: Left;   SUBMANDIBULAR GLAND EXCISION Right 12/15/2017   Procedure: EXCISION SUBMANDIBULAR GLAND;  Surgeon: Carloyn Manner, MD;  Location: [facility] ORS;  Service: ENT;  Laterality: Right;   TONSILLECTOMY     Family History:  Family History  Problem Relation Age of Onset   Nephrolithiasis Mother    Non-Hodgkin's lymphoma Mother 40       currently 28   Heart disease Father    CVA Father    Nephrolithiasis Maternal Grandfather    Diabetes Maternal Grandfather    Pancreatic cancer Maternal Grandfather 60       deceased 35   Breast cancer Sister 23       negative genetic testing; currently 73   Lymphoma Maternal Grandmother        deceased 68   Breast cancer Paternal Aunt        4 more paternal aunts with breast cancer dx 60s-70s   Breast cancer Cousin        2 paternal  cousins; daughters of pat aunt w/ BC at 28   Colon cancer Cousin 57       son of mat aunt   Breast cancer Paternal Aunt 35       deceased 10 of MI   Family Psychiatric  History: unknown Social History:  Social History   Substance and Sexual Activity  Alcohol Use No     Social History   Substance and Sexual Activity  Drug Use No    Social History   Socioeconomic History   Marital status: Divorced    Spouse name: Not on file   Number of children: Not on file   Years of education: Not on file   Highest education level: Not on file  Occupational History   Not on file  Tobacco Use   Smoking status: Former    Packs/day: 1.00    Years: 15.00    Pack years: 15.00  Types: Cigarettes    Quit date: 12/30/2011    Years since quitting: 9.1   Smokeless tobacco: Never  Vaping Use   Vaping Use: Never used  Substance and Sexual Activity   Alcohol use: No   Drug use: No   Sexual activity: Never  Other Topics Concern   Not on file  Social History Narrative   Not on file   Social Determinants of Health   Financial Resource Strain: Not on file  Food Insecurity: Not on file  Transportation Needs: Not on file  Physical Activity: Not on file  Stress: Not on file  Social Connections: Not on file   Additional Social History:    Allergies:  No Known Allergies  Labs:  Results for orders placed or performed during the hospital encounter of 02/24/21 (from the past 48 hour(s))  Comprehensive metabolic panel     Status: Abnormal   Collection Time: 02/24/21  5:01 AM  Result Value Ref Range   Sodium 135 135 - 145 mmol/L   Potassium 3.4 (L) 3.5 - 5.1 mmol/L   Chloride 102 98 - 111 mmol/L   CO2 23 22 - 32 mmol/L   Glucose, Bld 182 (H) 70 - 99 mg/dL    Comment: Glucose reference range applies only to samples taken after fasting for at least 8 hours.   BUN 15 6 - 20 mg/dL   Creatinine, Ser 0.79 0.44 - 1.00 mg/dL   Calcium 8.8 (L) 8.9 - 10.3 mg/dL   Total Protein 7.2 6.5 - 8.1  g/dL   Albumin 4.2 3.5 - 5.0 g/dL   AST 19 15 - 41 U/L   ALT 18 0 - 44 U/L   Alkaline Phosphatase 85 38 - 126 U/L   Total Bilirubin 0.7 0.3 - 1.2 mg/dL   GFR, Estimated >60 >60 mL/min    Comment: (NOTE) Calculated using the CKD-EPI Creatinine Equation (2021)    Anion gap 10 5 - 15    Comment: Performed at [facility], [address] [place], [place] [address]  Ethanol     Status: None   Collection Time: 02/24/21  5:01 AM  Result Value Ref Range   Alcohol, Ethyl (B) <10 <10 mg/dL    Comment: (NOTE) Lowest detectable limit for serum alcohol is 10 mg/dL.  For medical purposes only. Performed at [facility], [address] [place], [place] [address]   Salicylate level     Status: Abnormal   Collection Time: 02/24/21  5:01 AM  Result Value Ref Range   Salicylate Lvl <9.3 (L) 7.0 - 30.0 mg/dL    Comment: Performed at [facility], [address] [place], [place] [address]  Acetaminophen level     Status: Abnormal   Collection Time: 02/24/21  5:01 AM  Result Value Ref Range   Acetaminophen (Tylenol), Serum <10 (L) 10 - 30 ug/mL    Comment: (NOTE) Therapeutic concentrations vary significantly. A range of 10-30 ug/mL  may be an effective concentration for many patients. However, some  are best treated at concentrations outside of this range. Acetaminophen concentrations >150 ug/mL at 4 hours after ingestion  and >50 ug/mL at 12 hours after ingestion are often associated with  toxic reactions.  Performed at [facility] [address] [place],  [address]   cbc     Status: Abnormal   Collection Time: 02/24/21  5:01 AM  Result Value Ref Range   WBC 12.4 (H) 4.0 - 10.5 K/uL   RBC 4.56  3.87 - 5.11 MIL/uL   Hemoglobin 13.4 12.0 - 15.0 g/dL   HCT 39.3 36.0 - 46.0 %   MCV 86.2 80.0 - 100.0 fL   MCH 29.4 26.0 - 34.0 pg   MCHC 34.1 30.0 - 36.0 g/dL   RDW 13.2 11.5 - 15.5 %   Platelets 277 150 - 400 K/uL   nRBC 0.0 0.0 - 0.2 %     Comment: Performed at [facility], [address], [place], [place] [address]  Urine Drug Screen, Qualitative     Status: Abnormal   Collection Time: 02/24/21  5:01 AM  Result Value Ref Range   Tricyclic, Ur Screen POSITIVE (A) NONE DETECTED   Amphetamines, Ur Screen NONE DETECTED NONE DETECTED   MDMA (Ecstasy)Ur Screen NONE DETECTED NONE DETECTED   Cocaine Metabolite,Ur [place] NONE DETECTED NONE DETECTED   Opiate, Ur Screen NONE DETECTED NONE DETECTED   Phencyclidine (PCP) Ur S NONE DETECTED NONE DETECTED   Cannabinoid 50 Ng, Ur [place] NONE DETECTED NONE DETECTED   Barbiturates, Ur Screen NONE DETECTED NONE DETECTED   Benzodiazepine, Ur Scrn POSITIVE (A) NONE DETECTED   Methadone Scn, Ur NONE DETECTED NONE DETECTED    Comment: (NOTE) Tricyclics + metabolites, urine    Cutoff 1000 ng/mL Amphetamines + metabolites, urine  Cutoff 1000 ng/mL MDMA (Ecstasy), urine              Cutoff 500 ng/mL Cocaine Metabolite, urine          Cutoff 300 ng/mL Opiate + metabolites, urine        Cutoff 300 ng/mL Phencyclidine (PCP), urine         Cutoff 25 ng/mL Cannabinoid, urine                 Cutoff 50 ng/mL Barbiturates + metabolites, urine  Cutoff 200 ng/mL Benzodiazepine, urine              Cutoff 200 ng/mL Methadone, urine                   Cutoff 300 ng/mL  The urine drug screen provides only a preliminary, unconfirmed analytical test result and should not be used for non-medical purposes. Clinical consideration and professional judgment should be applied to any positive drug screen result due to possible interfering substances. A more specific alternate chemical method must be used in order to obtain a confirmed analytical result. Gas chromatography / mass spectrometry (GC/MS) is the preferred confirm atory method. Performed at [facility], [address] [place], [place] [address]   POC urine preg, ED     Status: None   Collection Time: 02/24/21  5:20 AM  Result Value  Ref Range   Preg Test, Ur NEGATIVE NEGATIVE    Comment:        THE SENSITIVITY OF THIS METHODOLOGY IS >24 mIU/mL     Current Facility-Administered Medications  Medication Dose Route Frequency Provider Last Rate Last Admin   aspirin EC tablet 81 mg  81 mg Oral Daily Paulette Blanch, MD   81 mg at 02/24/21 0926   atorvastatin (LIPITOR) tablet 40 mg  40 mg Oral Daily Paulette Blanch, MD   40 mg at 02/24/21 0926   baclofen (LIORESAL) tablet 5 mg  5 mg Oral BID Paulette Blanch, MD   5 mg at 02/24/21 0926   FLUoxetine (PROZAC) capsule 40 mg  40 mg Oral Daily Paulette Blanch, MD   40 mg at 02/24/21 [id number]  levETIRAcetam (KEPPRA) tablet 750 mg  750 mg Oral BID Paulette Blanch, MD   750 mg at 02/24/21 7564   levothyroxine (SYNTHROID) tablet 137 mcg  137 mcg Oral QAC breakfast Paulette Blanch, MD   137 mcg at 02/24/21 0734   losartan (COZAAR) tablet 50 mg  50 mg Oral Daily Paulette Blanch, MD   50 mg at 02/24/21 3329   metFORMIN (GLUCOPHAGE) tablet 500 mg  500 mg Oral Q breakfast Paulette Blanch, MD   500 mg at 02/24/21 5188   Current Outpatient Medications  Medication Sig Dispense Refill   aspirin EC 81 MG tablet Take 81 mg by mouth daily.     atorvastatin (LIPITOR) 40 MG tablet Take 1 tablet (40 mg total) by mouth daily. 30 tablet 3   baclofen (LIORESAL) 10 MG tablet Take 5 mg by mouth 2 (two) times daily.     FLUoxetine (PROZAC) 40 MG capsule Take 40 mg by mouth daily.     levETIRAcetam (KEPPRA) 750 MG tablet Take 1 tablet (750 mg total) by mouth 2 (two) times daily. 28 tablet 0   levothyroxine (SYNTHROID, LEVOTHROID) 137 MCG tablet Take 137 mcg daily before breakfast by mouth.      losartan (COZAAR) 50 MG tablet Take 50 mg by mouth daily.     metFORMIN (GLUCOPHAGE) 500 MG tablet Take 500 mg by mouth daily with breakfast.      Musculoskeletal: Strength & Muscle Tone: decreased Gait & Station:  did not observe Patient leans: N/A            Psychiatric Specialty Exam:  Presentation  General  Appearance: Appropriate for Environment  Eye Contact:Good  Speech:Other (comment) (slt impairment, deficit freom stroke)  Speech Volume:Normal  Handedness:No data recorded  Mood and Affect  Mood:Euthymic  Affect:Appropriate   Thought Process  Thought Processes:Coherent  Descriptions of Associations:Loose  Orientation:Full (Time, Place and Person)  Thought Content:Illogical  History of Schizophrenia/Schizoaffective disorder:No  Duration of Psychotic Symptoms:Greater than six months  Hallucinations:Hallucinations: Auditory Description of Auditory Hallucinations: "Neighbors upstairs saying differnt random words"  Ideas of Reference:Paranoia  Suicidal Thoughts:Suicidal Thoughts: No  Homicidal Thoughts:Homicidal Thoughts: No   Sensorium  Memory:No data recorded Judgment:Poor  Insight:Poor   Executive Functions  Concentration:Fair  Attention Span:Fair  [place]   Psychomotor Activity  Psychomotor Activity:Psychomotor Activity: Normal   Assets  Assets:Social Support; Catering manager; Housing   Sleep  Sleep:Sleep: Poor   Physical Exam: Physical Exam Vitals and nursing note reviewed.  HENT:     Head: Normocephalic.     Nose: No congestion or rhinorrhea.  Eyes:     General:        Right eye: No discharge.        Left eye: No discharge.  Musculoskeletal:        General: Normal range of motion.     Cervical back: Normal range of motion.  Neurological:     Mental Status: She is alert and oriented to person, place, and time.  Psychiatric:        Attention and Perception: Attention normal.        Mood and Affect: Mood normal.        Behavior: Behavior is cooperative.        Thought Content: Thought content is paranoid. Thought content does not include homicidal or suicidal ideation.        Cognition and Memory: Cognition normal.   Review of Systems  Psychiatric/Behavioral:  Positive  for hallucinations. Negative for depression, memory loss, substance abuse and suicidal ideas. The patient is not nervous/anxious and does not have insomnia.   Blood pressure (!) 134/92, pulse (!) 115, temperature 98.2 F (36.8 C), resp. rate 18, height 5\' 4"  (1.626 m), weight 92 kg, SpO2 91 %. Body mass index is 34.81 kg/m.  Treatment Plan Summary: Recommend psychiatric admission  Disposition: Recommend psychiatric Inpatient admission when medically cleared. Supportive therapy provided about ongoing stressors.  Sherlon Handing, NP 02/24/2021 10:27 AM

## 2021-02-24 NOTE — ED Notes (Signed)
[facility]  CONSULT  DONE  PENDING  GOING TO  [facility]  TONIGHT

## 2021-02-24 NOTE — BH Assessment (Signed)
Patient is to be admitted to [facility] 02/24/21 after 7:30pm pending negative Covid results by Dr. Weber Cooks.  Attending Physician will be Dr.  Weber Cooks .   Patient has been assigned to room 323, by [place], Arlyss Repress.    ER staff is aware of the admission: Luann, ER Secretary   Dr. Quentin Cornwall, ER MD  Amy, Patient's Nurse  Seth Bake, Patient Access.

## 2021-02-25 ENCOUNTER — Inpatient Hospital Stay: Payer: Medicare Other

## 2021-02-25 DIAGNOSIS — F29 Unspecified psychosis not due to a substance or known physiological condition: Secondary | ICD-10-CM

## 2021-02-25 LAB — LIPID PANEL
Cholesterol: 120 mg/dL (ref 0–200)
HDL: 43 mg/dL (ref >40)
LDL Cholesterol: 62 mg/dL (ref 0–99)
Total CHOL/HDL Ratio: 2.8 RATIO
Triglycerides: 76 mg/dL (ref <150)
VLDL: 15 mg/dL (ref 0–40)

## 2021-02-25 LAB — BASIC METABOLIC PANEL
Anion gap: 7 (ref 5–15)
BUN: 9 mg/dL (ref 6–20)
CO2: 22 mmol/L (ref 22–32)
Calcium: 8.6 mg/dL — ABNORMAL LOW (ref 8.9–10.3)
Chloride: 104 mmol/L (ref 98–111)
Creatinine, Ser: 0.78 mg/dL (ref 0.44–1.00)
GFR, Estimated: >60 mL/min (ref >60)
Glucose, Bld: 159 mg/dL — ABNORMAL HIGH (ref 70–99)
Potassium: 3.8 mmol/L (ref 3.5–5.1)
Sodium: 133 mmol/L — ABNORMAL LOW (ref 135–145)

## 2021-02-25 LAB — TSH: TSH: 0.162 u[IU]/mL — ABNORMAL LOW (ref 0.350–4.500)

## 2021-02-25 LAB — HEMOGLOBIN A1C
Hgb A1c MFr Bld: 6.8 % — ABNORMAL HIGH (ref 4.8–5.6)
Mean Plasma Glucose: 148.46 mg/dL

## 2021-02-25 MED ORDER — LEVETIRACETAM 500 MG PO TABS
500.0000 mg | ORAL_TABLET | Freq: Two times a day (BID) | ORAL | Status: DC
  Administered 2021-02-25 – 2021-02-26 (×2): 500 mg via ORAL
  Filled 2021-02-25 (×3): qty 1

## 2021-02-25 MED ORDER — RISPERIDONE 1 MG PO TABS
0.5000 mg | ORAL_TABLET | Freq: Every day | ORAL | Status: DC
  Administered 2021-02-25: 0.5 mg via ORAL
  Filled 2021-02-25: qty 1

## 2021-02-25 MED ORDER — IOHEXOL 300 MG/ML  SOLN
75.0000 mL | Freq: Once | INTRAMUSCULAR | Status: AC | PRN
  Administered 2021-02-25: 75 mL via INTRAVENOUS

## 2021-02-25 MED ORDER — LEVOTHYROXINE SODIUM 100 MCG PO TABS
100.0000 ug | ORAL_TABLET | Freq: Every day | ORAL | Status: DC
  Administered 2021-02-26: 100 ug via ORAL
  Filled 2021-02-25: qty 1

## 2021-02-25 NOTE — Progress Notes (Signed)
Recreation Therapy Notes  Date: 02/25/2021  Time: 10:50 am   Location: Craft room    Behavioral response: N/A   Intervention Topic: Stress Management   Discussion/Intervention: Patient did not attend group.   Clinical Observations/Feedback:  Patient did not attend group.    Prabhav Faulkenberry LRT/CTRS        Michelle Vanhise 02/25/2021 12:19 PM

## 2021-02-25 NOTE — Progress Notes (Addendum)
D: Pt alert and oriented this shift. In and out of room throughout the day. Denies SI/H/AVH. Rates depression 4/10 and anxiety 3/10.  Is extremely tearful and crying during assessment. C/o pain to left hip that radiates down the leg and R knee above and below the knee. Due to gabbled speech and expressive aphasia RN has difficult time understanding.  Pt reports not sleep well last night due to pain. See pain assessment for details.   A: Scheduled medications administered to pt, per MD orders. Support and encouragement provided to patient throughout the shift.  Frequent verbal contact made. Routine safety checks conducted q15 minutes.   R: No adverse drug reactions noted. Pt verbally contracts for safety at this time. Pt complaint with medications and treatment plan. Pt remains safe at this time. Will continue to monitor.

## 2021-02-25 NOTE — Progress Notes (Signed)
Patient woke up this am, hallucinating, states her husband is upstairs talking, reports her daddy is in jail. Reports they are all here and she needs to go. Reports she was talking them and pointing to the ceiling. Patient with expressive aphasia, tearful at times. Mom called at patient request this am.

## 2021-02-25 NOTE — BHH Counselor (Signed)
Adult Comprehensive Assessment  Patient ID: Melissa Day, female   DOB: 1970-07-24, 51 y.o.   MRN: [id number]  Information Source: Information source: Interpreter  Current Stressors:  Patient states their primary concerns and needs for treatment are:: pt mother stated "she's hearing voices, she thinks her house is being bugged, she will not shower with lights on, she feels like someone has came into steal her belongings". Patient states their goals for this hospitilization and ongoing recovery are:: pt mother said "I want my daughter to be happy and want to not be unwell again" Educational / Learning stressors: pt mother stated "no" Employment / Job issues: pt mother stated that "she can't work but she isn't able to" Family Relationships: pt mother stated "she thinks me and her sister is taking over her lifePublishing copy / Lack of resources (include bankruptcy): pt mother stated "no" Housing / Lack of housing: pt mother stated "no" Physical health (include injuries & life threatening diseases): pt mother stated " 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 she does have strokes, she's been having back pain" Social relationships: pt mother stated "no" Substance abuse: pt mother stated "no" Bereavement / Loss: pt mother stated "her cousin passed away 4 years ago"  Living/Environment/Situation:  Living Arrangements: Parent Living conditions (as described by patient or guardian): pt mother stated "she doesn't like living with other people, she wants to live alone which she does" Who else lives in the home?: pt mother stated "she lives at her own apartment but she's living with a friend" How long has patient lived in current situation?: pt mother What is atmosphere in current home: Chaotic (pt mother stated " she thinks people are watching her in her apartment")  Family History:  Marital status: Long term relationship (pt mother stated  "her and her friend has been together for 4 years now") Long term relationship, how long?: pt mother stated "her and her friend has been together for 4 years now" What types of issues is patient dealing with in the relationship?: pt mother said "she doesn't think so but she said he's not good at working" Does patient have children?: Yes How many children?: 1 How is patient's relationship with their children?: pt mother stated " she has one son that's 97 yrs old and they were trying to take him away and put him with his daddy/ her ex husand but he's a drug addict"  Childhood History:  By whom was/is the patient raised?: Both parents Description of patient's relationship with caregiver when they were a child: pt mother said "she's my baby but people say we're just alike. I thought we have a good relationship" Patient's description of current relationship with people who raised him/her: pt mother stated " her and her daddy has a better relationship but we love her" How were you disciplined when you got in trouble as a child/adolescent?: pt mother said " oh yes, she got spanked" Does patient have siblings?: Yes (pt mother stated "she has an older sister") Number of Siblings: 1 Description of patient's current relationship with siblings: pt mother stated "she loves her sister, she depends on her older sister". Did patient suffer any verbal/emotional/physical/sexual abuse as a child?: No Did patient suffer from severe childhood neglect?: No Has patient ever been sexually abused/assaulted/raped as an adolescent or adult?: No Was the patient ever a victim of a crime or a disaster?: No Witnessed domestic violence?: No Has patient  been affected by domestic violence as an adult?: No  Education:  Highest grade of school patient has completed: pt mother stated "she finished high school" Currently a student?: No Learning disability?: No (pt mother stated " she was slow but she didn't know if she had any  actual learning disability")  Employment/Work Situation:   Why is Patient on Disability: pt mother stated " this past october 3rd, was 10 years ago when she had her stroke" How Long has Patient Been on Disability: pt mother stated " it's been 10 years" Patient's Job has Been Impacted by Current Illness: No What is the Longest Time Patient has Held a Job?: n/a Where was the Patient Employed at that Time?: n/a Has Patient ever Been in the [employer]?: No  Financial Resources:   Financial resources: Medicaid Does patient have a Programmer, applications or guardian?: Yes Name of representative payee or guardian: Alesia Banda  Alcohol/Substance Abuse:   Alcohol/Substance Abuse Treatment Hx: Denies past history  Social Support System:   Patient's Community Support System: Manufacturing engineer System: pt mother stated " she has a lot of people who loves her" Type of faith/religion: pt mother stated "she's baptist" How does patient's faith help to cope with current illness?: n/a  Leisure/Recreation:   Do You Have Hobbies?: No  Strengths/Needs:   What is the patient's perception of their strengths?: pt mother stated "she has a heart of gold, she can be compassionate, she loves children and babies"  Discharge Plan:   Currently receiving community mental health services: No Patient states concerns and preferences for aftercare planning are: pt mother said "if it'll help her then i'm all for it." Does patient have access to transportation?: Yes Does patient have financial barriers related to discharge medications?: No Will patient be returning to same living situation after discharge?: Yes  Summary/Recommendations:   Summary and Recommendations (to be completed by the evaluator): Patient is a 51 year old woman with from [place], [place] [facility]). She presented to the hospital due to paranioa, depression, and anxiety. Patient mother stated that "I want her to find help  because she's usually not like this". Patient expressed that "I just want to be happy and better". Patient mother reported that she doesn't have any mental health provider, but wants her to have one. Patient has been very emotional and hysterical since she's arrived, but she has stated that she does want some help. Recommendations include: crisis stabilization, therapeutic milieu, encourage group attendance and participation, medication managemnet for mood stabilizaton and development of comprehensive mental wellness/sobriety plan.  Karalee Height. 02/25/2021

## 2021-02-25 NOTE — Progress Notes (Signed)
Patient presents with sad, flat affect. Denies any SI, HI,VH. Endorses depression and auditory hallucinations. Reports she hears the neighbors talking about her and it stresses her out. Patient with right sided deficits due to stroke. Patient unable to use right arm and limps. Patient reports she is steady on her feet. Did complain of foot pain. Patient not able to write, this Probation officer signed admission paperwork at her request. Skin and contraband search completed and witnessed by [place], Therapist, sports. No skin issues noted, no contraband found. Fluid and nutrition offered and refused. Patient sleep early shift, could not awaken for meds. No other concerns or complaints voiced. Pt remains safe on unit with q 15 min checks.

## 2021-02-25 NOTE — BH IP Treatment Plan (Signed)
Interdisciplinary Treatment and Diagnostic Plan Update  02/25/2021 Time of Session: 9:30 AM Melissa Day MRN: [id number]  Principal Diagnosis: Psychosis [facility])  Secondary Diagnoses: Principal Problem:   Psychosis ([place])   Current Medications:  Current Facility-Administered Medications  Medication Dose Route Frequency Provider Last Rate Last Admin   acetaminophen (TYLENOL) tablet 650 mg  650 mg Oral Q6H PRN Waldon Merl F, NP   650 mg at 02/25/21 0807   alum & mag hydroxide-simeth (MAALOX/MYLANTA) 200-200-20 MG/5ML suspension 30 mL  30 mL Oral Q4H PRN Waldon Merl F, NP       aspirin EC tablet 81 mg  81 mg Oral Daily Waldon Merl F, NP   81 mg at 02/25/21 0807   atorvastatin (LIPITOR) tablet 40 mg  40 mg Oral Daily Waldon Merl F, NP   40 mg at 02/25/21 9417   baclofen (LIORESAL) tablet 5 mg  5 mg Oral BID Waldon Merl F, NP   5 mg at 02/25/21 0807   FLUoxetine (PROZAC) capsule 40 mg  40 mg Oral Daily Waldon Merl F, NP   40 mg at 02/25/21 0807   levETIRAcetam (KEPPRA) tablet 750 mg  750 mg Oral BID Waldon Merl F, NP   750 mg at 02/25/21 4081   levothyroxine (SYNTHROID) tablet 137 mcg  137 mcg Oral QAC breakfast Waldon Merl F, NP   137 mcg at 02/25/21 0622   losartan (COZAAR) tablet 50 mg  50 mg Oral Daily Waldon Merl F, NP   50 mg at 02/25/21 4481   magnesium hydroxide (MILK OF MAGNESIA) suspension 30 mL  30 mL Oral Daily PRN Waldon Merl F, NP       metFORMIN (GLUCOPHAGE) tablet 500 mg  500 mg Oral Q breakfast Waldon Merl F, NP   500 mg at 02/25/21 0807   PTA Medications: Medications Prior to Admission  Medication Sig Dispense Refill Last Dose   aspirin EC 81 MG tablet Take 81 mg by mouth daily.      atorvastatin (LIPITOR) 40 MG tablet Take 1 tablet (40 mg total) by mouth daily. 30 tablet 3    baclofen (LIORESAL) 10 MG tablet Take 5 mg by mouth 2 (two) times daily.      FLUoxetine (PROZAC) 40 MG capsule Take 40 mg by mouth daily.       levETIRAcetam (KEPPRA) 750 MG tablet Take 1 tablet (750 mg total) by mouth 2 (two) times daily. 28 tablet 0    levothyroxine (SYNTHROID, LEVOTHROID) 137 MCG tablet Take 137 mcg daily before breakfast by mouth.       losartan (COZAAR) 50 MG tablet Take 50 mg by mouth daily.      metFORMIN (GLUCOPHAGE) 500 MG tablet Take 500 mg by mouth daily with breakfast.       Patient Stressors: Health problems   Medication change or noncompliance    Patient Strengths: Motivation for treatment/growth  Supportive family/friends   Treatment Modalities: Medication Management, Group therapy, Case management,  1 to 1 session with clinician, Psychoeducation, Recreational therapy.   Physician Treatment Plan for Primary Diagnosis: Psychosis ([place]) Long Term Goal(s):     Short Term Goals:    Medication Management: Evaluate patient's response, side effects, and tolerance of medication regimen.  Therapeutic Interventions: 1 to 1 sessions, Unit Group sessions and Medication administration.  Evaluation of Outcomes: Not Met  Physician Treatment Plan for Secondary Diagnosis: Principal Problem:   Psychosis ([place])  Long Term Goal(s):     Short Term Goals:  Medication Management: Evaluate patient's response, side effects, and tolerance of medication regimen.  Therapeutic Interventions: 1 to 1 sessions, Unit Group sessions and Medication administration.  Evaluation of Outcomes: Not Met   RN Treatment Plan for Primary Diagnosis: Psychosis ([place]) Long Term Goal(s): Knowledge of disease and therapeutic regimen to maintain health will improve  Short Term Goals: Ability to remain free from injury will improve, Ability to verbalize frustration and anger appropriately will improve, Ability to demonstrate self-control, Ability to participate in decision making will improve, Ability to verbalize feelings will improve, Ability to disclose and discuss suicidal ideas, and Ability to identify and develop effective  coping behaviors will improve  Medication Management: RN will administer medications as ordered by provider, will assess and evaluate patient's response and provide education to patient for prescribed medication. RN will report any adverse and/or side effects to prescribing provider.  Therapeutic Interventions: 1 on 1 counseling sessions, Psychoeducation, Medication administration, Evaluate responses to treatment, Monitor vital signs and CBGs as ordered, Perform/monitor CIWA, COWS, AIMS and Fall Risk screenings as ordered, Perform wound care treatments as ordered.  Evaluation of Outcomes: Not Met   LCSW Treatment Plan for Primary Diagnosis: Psychosis ([place]) Long Term Goal(s): Safe transition to appropriate next level of care at discharge, Engage patient in therapeutic group addressing interpersonal concerns.  Short Term Goals: Engage patient in aftercare planning with referrals and resources, Increase social support, Increase ability to appropriately verbalize feelings, Increase emotional regulation, Facilitate acceptance of mental health diagnosis and concerns, and Increase skills for wellness and recovery  Therapeutic Interventions: Assess for all discharge needs, 1 to 1 time with Social worker, Explore available resources and support systems, Assess for adequacy in community support network, Educate family and significant other(s) on suicide prevention, Complete Psychosocial Assessment, Interpersonal group therapy.  Evaluation of Outcomes: Not Met   Progress in Treatment: Attending groups: No. Participating in groups: No. Taking medication as prescribed: Yes. Toleration medication: Yes. Family/Significant other contact made: No, will contact:  once permission is given Patient understands diagnosis: Yes. Discussing patient identified problems/goals with staff: Yes. Medical problems stabilized or resolved: Yes. Denies suicidal/homicidal ideation: Yes. Issues/concerns per patient  self-inventory: No. Other: none  New problem(s) identified: No, Describe:  none  New Short Term/Long Term Goal(s): elimination of symptoms of psychosis, medication management for mood stabilization; elimination of SI thoughts; development of comprehensive mental wellness plan.   Patient Goals:  "I just want to be happy"  Discharge Plan or Barriers: CSW will assist patient in development of appropriate discharge plans.   Reason for Continuation of Hospitalization: Anxiety Depression Hallucinations Medication stabilization Suicidal ideation  Estimated Length of Stay:  1-7 days   Scribe for Treatment Team: Rozann Lesches, Marlinda Mike 02/25/2021 12:45 PM

## 2021-02-25 NOTE — Group Note (Unsigned)
[place] LCSW Group Therapy Note   Group Date: 02/25/2021 Start Time: 1300 End Time: 1400   Type of Therapy/Topic:  Group Therapy:  Emotion Regulation  Participation Level:  {BHH PARTICIPATION LEVEL:22264}   Mood:  Description of Group:    The purpose of this group is to assist patients in learning to regulate negative emotions and experience positive emotions. Patients will be guided to discuss ways in which they have been vulnerable to their negative emotions. These vulnerabilities will be juxtaposed with experiences of positive emotions or situations, and patients challenged to use positive emotions to combat negative ones. Special emphasis will be placed on coping with negative emotions in conflict situations, and patients will process healthy conflict resolution skills.  Therapeutic Goals: 1. Patient will identify two positive emotions or experiences to reflect on in order to balance out negative emotions:  2. Patient will label two or more emotions that they find the most difficult to experience:  3. Patient will be able to demonstrate positive conflict resolution skills through discussion or role plays:   Summary of Patient Progress:   ***    Therapeutic Modalities:   Cognitive Behavioral Therapy Feelings Identification Dialectical Behavioral Therapy   Karalee Height, Student-Social Work

## 2021-02-25 NOTE — BHH Suicide Risk Assessment (Signed)
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  51 year old woman with minimal past psychiatric history presents with 6 months to a year of worsening paranoia and hallucinations causing subjective distress and living difficulties.  Patient has a expressive aphasia and making communication difficult.  History obtained mostly from her mother and then confirmed with the patient.  No report of any suicidal ideation at all.  No dangerous behavior reported.  Continued Clinical Symptoms:  Alcohol Use Disorder Identification Test Final Score (AUDIT): 0 The "Alcohol Use Disorders Identification Test", Guidelines for Use in Primary Care, Second Edition.  World Pharmacologist [facility]). Score between 0-7:  no or low risk or alcohol related problems. Score between 8-15:  moderate risk of alcohol related problems. Score between 16-19:  high risk of alcohol related problems. Score 20 or above:  warrants further diagnostic evaluation for alcohol dependence and treatment.   CLINICAL FACTORS:   Currently Psychotic   Musculoskeletal: Strength & Muscle Tone: decreased Gait & Station: unsteady Patient leans: Right  Psychiatric Specialty Exam:  Presentation  General Appearance: Appropriate for Environment  Eye Contact:Good  Speech:Other (comment) (slt impairment, deficit freom stroke)  Speech  Volume:Normal  Handedness:No data recorded  Mood and Affect  Mood:Euthymic  Affect:Appropriate   Thought Process  Thought Processes:Coherent  Descriptions of Associations:Loose  Orientation:Full (Time, Place and Person)  Thought Content:Illogical  History of Schizophrenia/Schizoaffective disorder:No  Duration of Psychotic Symptoms:Greater than six months  Hallucinations:Hallucinations: Auditory Description of Auditory Hallucinations: "Neighbors upstairs saying differnt random words"  Ideas of Reference:Paranoia  Suicidal Thoughts:Suicidal Thoughts: No  Homicidal Thoughts:Homicidal Thoughts: No   Sensorium  Memory:No data recorded Judgment:Poor  Insight:Poor   Executive Functions  Concentration:Fair  Attention Span:Fair  [place]   Psychomotor Activity  Psychomotor Activity:Psychomotor Activity: Normal   Assets  Assets:Social Support; Catering manager; Housing   Sleep  Sleep:Sleep: Poor    Physical Exam: Physical Exam Constitutional:      Appearance: Normal appearance.  HENT:     Head: Normocephalic and atraumatic.     Mouth/Throat:     Pharynx: Oropharynx is clear.  Eyes:     Pupils: Pupils are equal, round, and reactive to light.  Cardiovascular:     Rate and Rhythm: Normal rate and regular rhythm.  Pulmonary:     Effort: Pulmonary effort is normal.     Breath sounds: Normal breath sounds.  Abdominal:     General: Abdomen is flat.     Palpations: Abdomen is soft.  Musculoskeletal:        General: Normal range of motion.  Skin:    General: Skin is warm and dry.  Neurological:     Mental Status: She is alert. Mental status is at baseline.     Comments: Multiple longstanding deficits from a history of stroke with significant weakness on 1 side.  Patient has a expressive aphasia.  Psychiatric:        Attention and Perception: Attention normal.  Mood and Affect: Mood is  anxious.        Speech: She is noncommunicative.        Behavior: Behavior is withdrawn.        Thought Content: Thought content normal.   Review of Systems  Constitutional: Negative.   HENT: Negative.    Eyes: Negative.   Respiratory: Negative.    Cardiovascular: Negative.   Gastrointestinal: Negative.   Musculoskeletal: Negative.   Skin: Negative.   Neurological:  Positive for focal weakness and weakness.  Psychiatric/Behavioral:  Positive for hallucinations. Negative for depression and suicidal ideas. The patient is nervous/anxious.   Blood pressure 119/85, pulse (!) 102, temperature 98.2 F (36.8 C), temperature source Oral, resp. rate 17, height 5' 4"  (1.626 m), weight 90.3 kg, SpO2 97 %. Body mass index is 34.16 kg/m.   COGNITIVE FEATURES THAT CONTRIBUTE TO RISK:  Thought constriction (tunnel vision)    SUICIDE RISK:   Minimal: No identifiable suicidal ideation.  Patients presenting with no risk factors but with morbid ruminations; may be classified as minimal risk based on the severity of the depressive symptoms  PLAN OF CARE: Review medications.  Several minor changes.  Continue 15-minute checks.  Patient met with treatment team.  Continue getting collateral information and updating family.  Reassess suicidality prior to discharge  I certify that inpatient services furnished can reasonably be expected to improve the patient's condition.   Alethia Berthold, MD 02/25/2021, 3:39 PM

## 2021-02-25 NOTE — Plan of Care (Signed)
°  Problem: Education: Goal: Emotional status will improve Outcome: Progressing   Problem: Self-Concept: Goal: Level of anxiety will decrease Outcome: Progressing

## 2021-02-25 NOTE — Progress Notes (Signed)
Patient with elevated b/p this am. Vitals taken after patient became upset and walked to phone.

## 2021-02-25 NOTE — H&P (Signed)
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  51 year old woman who has a history of having had a major stroke about 10 years ago.  Since that time she has had right-sided hemiplegia and an expressive aphasia.  As a result history is obtained largely from the chart and from her mother.  Mother reports that the patient has had psychotic like symptoms that have been developing and becoming worse over the past 6 to 12 months.  They seem to be focused on the belief that people who live in the apartment above her are watching her and talking to her and somehow spying on her or influencing her life.  They started out as somewhat less bizarre paranoia but has become more elaborate with time.  Police have been called in and been unable to find anything wrong.  Family has reassured her that there is no evidence of any of this surveillance but she remains upset by it.  Sleep is being impaired at night.  Mood is anxious throughout the day.  There is no report of any suicidal ideation or violent or homicidal ideation at any point.  I reviewed all of this with the patient who indicates agreement that that is what has been happening to her.  She absolutely denies any suicidal thought.  Patient and her mother have been to several providers about this.  They have seen her primary care doctor who has suggested that she might need to see a psychiatrist for treatment.  They have been to the emergency room at [facility] but not admitted to the hospital.  Patient is on multiple chronic medicines including  Keppra which she has been on for nearly 10 years because of the stroke.  Was not at baseline on any psychiatric medicine Prozac had recently been added apparently in some sort of effort to address her symptoms.  No substance abuse reported Associated Signs/Symptoms: Depression Symptoms:  insomnia, difficulty concentrating, anxiety, Duration of Depression Symptoms: No data recorded (Hypo) Manic Symptoms:  Distractibility, Hallucinations, Anxiety Symptoms:  Excessive Worry, Psychotic Symptoms:  Delusions, Hallucinations: Auditory Paranoia, PTSD Symptoms: Negative Total Time spent with patient: 1 hour  Past Psychiatric History: Patient had no significant past psychiatric history.  No history of hospitalizations.  No history of psychotic disorder.  Had not been on psychiatric medicine previously.  No history of violence or suicide attempts.  Is the patient at risk to self? No.  Has the patient been a risk to self in the past 6 months? No.  Has the patient been a risk to self within the distant past? No.  Is the patient a risk to others? No.  Has the patient been a risk to others in the past 6 months? No.  Has the patient been a risk to others within the distant past? No.   Prior Inpatient Therapy:   Prior Outpatient Therapy:    Alcohol Screening: 1. How often do you have a drink containing alcohol?: Never 2. How many drinks containing alcohol do you have on a typical day when you are drinking?: 1 or 2 3. How often do you have six or  more drinks on one occasion?: Never AUDIT-C Score: 0 4. How often during the last year have you found that you were not able to stop drinking once you had started?: Never 5. How often during the last year have you failed to do what was normally expected from you because of drinking?: Never 6. How often during the last year have you needed a first drink in the morning to get yourself going after a heavy drinking session?: Never 7. How often during the last  year have you had a feeling of guilt of remorse after drinking?: Never 8. How often during the last year have you been unable to remember what happened the night before because you had been drinking?: Never 9. Have you or someone else been injured as a result of your drinking?: No 10. Has a relative or friend or a doctor or another health worker been concerned about your drinking or suggested you cut down?: No Alcohol Use Disorder Identification Test Final Score (AUDIT): 0 Substance Abuse History in the last 12 months:  No. Consequences of Substance Abuse: Negative Previous Psychotropic Medications: Yes  Psychological Evaluations: No  Past Medical History:  Past Medical History:  Diagnosis Date   Anemia    Cancer ([place]) 2018   br cancer on left with lump and rad tx    Diabetes mellitus without complication ([place])    Family history of colonic polyps    H/O: CVA (cerebrovascular accident) 2012   a. cerebral edema, craniotomy, residual right upper & lower limb weakness   Heart murmur    High cholesterol    History of colon polyps    History of echocardiogram    a. 12/01/2013: EF 50-55%, nl global LV systolic function, mild MR, mildly increased LV posterior wall thickness   History of kidney stones    History of stress test    a. 12/01/2013: no significant ischemia, no significant WMA, no EKG changes concerning for ischemia, EF 67%, low risk scan   Hx of seizure disorder    a. one episode 10 months after CVA, since then on Keppra    Hypothyroidism    Personal history of radiation therapy 2019   LEFT lumpectomy   Seizures ([place])    Stroke ([place]) 2012   Thyroid disease     Past Surgical History:  Procedure Laterality Date   BRAIN SURGERY     decompression after stroke   BREAST BIOPSY Left 12/13/2016   path pending   BREAST LUMPECTOMY Left 01/06/2017   DUCTAL CARCINOMA IN SITU, NUCLEAR GRADE 3.   BREAST LUMPECTOMY WITH NEEDLE LOCALIZATION Left 01/06/2017   Procedure: BREAST  LUMPECTOMY WITH NEEDLE LOCALIZATION;  Surgeon: Leonie Green, MD;  Location: [facility] ORS;  Service: General;  Laterality: Left;   COLONOSCOPY WITH PROPOFOL N/A 11/18/2014   Procedure: COLONOSCOPY WITH PROPOFOL;  Surgeon: Josefine Class, MD;  Location[facility] ENDOSCOPY;  Service: Endoscopy;  Laterality: N/A;   COLONOSCOPY WITH PROPOFOL N/A 08/19/2020   Procedure: COLONOSCOPY WITH PROPOFOL;  Surgeon: Lesly Rubenstein, MD;  Location: [facility] ENDOSCOPY;  Service: Endoscopy;  Laterality: N/A;   CYSTOSCOPY/RETROGRADE/URETEROSCOPY/STONE EXTRACTION WITH BASKET Right 10/29/2014   Procedure: CYSTOSCOPY/URETEROSCOPY/STONE EXTRACTION WITH BASKET;  Surgeon: Franchot Gallo, MD;  Location: [facility] ORS;  Service: Urology;  Laterality: Right;   ESOPHAGOGASTRODUODENOSCOPY (EGD) WITH PROPOFOL N/A 11/18/2014   Procedure: ESOPHAGOGASTRODUODENOSCOPY (EGD) WITH PROPOFOL;  Surgeon: Josefine Class, MD;  Location: [facility] ENDOSCOPY;  Service: Endoscopy;  Laterality: N/A;   RE-EXCISION OF BREAST LUMPECTOMY Left 01/25/2017  Procedure: RE-EXCISION OF BREAST LUMPECTOMY;  Surgeon: Leonie Green, MD;  Location: [facility] ORS;  Service: General;  Laterality: Left;   SUBMANDIBULAR GLAND EXCISION Right 12/15/2017   Procedure: EXCISION SUBMANDIBULAR GLAND;  Surgeon: Carloyn Manner, MD;  Location: [facility] ORS;  Service: ENT;  Laterality: Right;   TONSILLECTOMY     Family History:  Family History  Problem Relation Age of Onset   Nephrolithiasis Mother    Non-Hodgkin's lymphoma Mother 20       currently 76   Heart disease Father    CVA Father    Nephrolithiasis Maternal Grandfather    Diabetes Maternal Grandfather    Pancreatic cancer Maternal Grandfather 51       deceased 58   Breast cancer Sister 73       negative genetic testing; currently 39   Lymphoma Maternal Grandmother        deceased 28   Breast cancer Paternal Aunt        75 more paternal aunts with breast cancer dx 60s-70s   Breast cancer Cousin         2 paternal cousins; daughters of pat aunt w/ BC at 60   Colon cancer Cousin 15       son of mat aunt   Breast cancer Paternal Aunt 67       deceased 77 of MI   Family Psychiatric  History: None reported Tobacco Screening:   Social History:  Social History   Substance and Sexual Activity  Alcohol Use No     Social History   Substance and Sexual Activity  Drug Use No    Additional Social History: Marital status: Long term relationship (pt mother stated "her and her friend has been together for 4 years now") Long term relationship, how long?: pt mother stated "her and her friend has been together for 4 years now" What types of issues is patient dealing with in the relationship?: pt mother said "she doesn't think so but she said he's not good at working" Does patient have children?: Yes How many children?: 1 How is patient's relationship with their children?: pt mother stated " she has one son that's 34 yrs old and they were trying to take him away and put him with his daddy/ her ex husand but he's a drug addict"                         Allergies:  No Known Allergies Lab Results:  Results for orders placed or performed during the hospital encounter of 02/24/21 (from the past 76 hour(s))  TSH     Status: Abnormal   Collection Time: 02/25/21  9:38 AM  Result Value Ref Range   TSH 0.162 (L) 0.350 - 4.500 uIU/mL    Comment: Performed by a 3rd Generation assay with a functional sensitivity of <=0.01 uIU/mL. Performed at [facility], [address] [place], [place] [address]   Lipid panel     Status: None   Collection Time: 02/25/21  9:38 AM  Result Value Ref Range   Cholesterol 120 0 - 200 mg/dL   Triglycerides 76 <150 mg/dL   HDL 43 >40 mg/dL   Total CHOL/HDL Ratio 2.8 RATIO   VLDL 15 0 - 40 mg/dL   LDL Cholesterol 62 0 - 99 mg/dL    Comment:        Total Cholesterol/HDL:CHD Risk Coronary Heart Disease Risk Table  Men   Women  1/2  Average Risk   3.4   3.3  Average Risk       5.0   4.4  2 X Average Risk   9.6   7.1  3 X Average Risk  23.4   11.0        Use the calculated Patient Ratio above and the CHD Risk Table to determine the patient's CHD Risk.        ATP III CLASSIFICATION (LDL):  <100     mg/dL   Optimal  100-129  mg/dL   Near or Above                    Optimal  130-159  mg/dL   Borderline  160-189  mg/dL   High  >190     mg/dL   Very High Performed at [facility], [address] [place], [place] [address]   Hemoglobin A1c     Status: Abnormal   Collection Time: 02/25/21  9:38 AM  Result Value Ref Range   Hgb A1c MFr Bld 6.8 (H) 4.8 - 5.6 %    Comment: (NOTE) Pre diabetes:          5.7%-6.4%  Diabetes:              >6.4%  Glycemic control for   <7.0% adults with diabetes    Mean Plasma Glucose 148.46 mg/dL    Comment: Performed at [facility] [address] [place], [place] [address]    Blood Alcohol level:  Lab Results  Component Value Date   ETH <10 71/07/2692    Metabolic Disorder Labs:  Lab Results  Component Value Date   HGBA1C 6.8 (H) 02/25/2021   MPG 148.46 02/25/2021   No results found for: PROLACTIN Lab Results  Component Value Date   CHOL 120 02/25/2021   TRIG 76 02/25/2021   HDL 43 02/25/2021   CHOLHDL 2.8 02/25/2021   VLDL 15 02/25/2021   LDLCALC 62 02/25/2021   LDLCALC 94 12/01/2013    Current Medications: Current Facility-Administered Medications  Medication Dose Route Frequency Provider Last Rate Last Admin   acetaminophen (TYLENOL) tablet 650 mg  650 mg Oral Q6H PRN Sherlon Handing, NP   650 mg at 02/25/21 0807   alum & mag hydroxide-simeth (MAALOX/MYLANTA) 200-200-20 MG/5ML suspension 30 mL  30 mL Oral Q4H PRN Waldon Merl F, NP       aspirin EC tablet 81 mg  81 mg Oral Daily Waldon Merl F, NP   81 mg at 02/25/21 0807   atorvastatin (LIPITOR) tablet 40 mg  40 mg Oral Daily Waldon Merl F, NP   40 mg at 02/25/21 8546    baclofen (LIORESAL) tablet 5 mg  5 mg Oral BID Waldon Merl F, NP   5 mg at 02/25/21 0807   FLUoxetine (PROZAC) capsule 40 mg  40 mg Oral Daily Waldon Merl F, NP   40 mg at 02/25/21 2703   levETIRAcetam (KEPPRA) tablet 500 mg  500 mg Oral BID Sampson Self, Madie Reno, MD       [START ON 02/26/2021] levothyroxine (SYNTHROID) tablet 100 mcg  100 mcg Oral QAC breakfast Livia Tarr, Madie Reno, MD       losartan (COZAAR) tablet 50 mg  50 mg Oral Daily Waldon Merl F, NP   50 mg at 02/25/21 5009   magnesium hydroxide (MILK OF MAGNESIA) suspension 30 mL  30 mL Oral Daily PRN Sherlon Handing,  NP       metFORMIN (GLUCOPHAGE) tablet 500 mg  500 mg Oral Q breakfast Waldon Merl F, NP   500 mg at 02/25/21 9024   risperiDONE (RISPERDAL) tablet 0.5 mg  0.5 mg Oral QHS Linville Decarolis, Madie Reno, MD       PTA Medications: Medications Prior to Admission  Medication Sig Dispense Refill Last Dose   aspirin EC 81 MG tablet Take 81 mg by mouth daily.      atorvastatin (LIPITOR) 40 MG tablet Take 1 tablet (40 mg total) by mouth daily. 30 tablet 3    baclofen (LIORESAL) 10 MG tablet Take 5 mg by mouth 2 (two) times daily.      FLUoxetine (PROZAC) 40 MG capsule Take 40 mg by mouth daily.      levETIRAcetam (KEPPRA) 750 MG tablet Take 1 tablet (750 mg total) by mouth 2 (two) times daily. 28 tablet 0    levothyroxine (SYNTHROID, LEVOTHROID) 137 MCG tablet Take 137 mcg daily before breakfast by mouth.       losartan (COZAAR) 50 MG tablet Take 50 mg by mouth daily.      metFORMIN (GLUCOPHAGE) 500 MG tablet Take 500 mg by mouth daily with breakfast.       Musculoskeletal: Strength & Muscle Tone: abnormal and decreased Gait & Station: unsteady Patient leans: Right            Psychiatric Specialty Exam:  Presentation  General Appearance: Appropriate for Environment  Eye Contact:Good  Speech:Other (comment) (slt impairment, deficit freom stroke)  Speech Volume:Normal  Handedness:No data recorded  Mood and  Affect  Mood:Euthymic  Affect:Appropriate   Thought Process  Thought Processes:Coherent  Duration of Psychotic Symptoms: Greater than six months  Past Diagnosis of Schizophrenia or Psychoactive disorder: No  Descriptions of Associations:Loose  Orientation:Full (Time, Place and Person)  Thought Content:Illogical  Hallucinations:Hallucinations: Auditory Description of Auditory Hallucinations: "Neighbors upstairs saying differnt random words"  Ideas of Reference:Paranoia  Suicidal Thoughts:Suicidal Thoughts: No  Homicidal Thoughts:Homicidal Thoughts: No   Sensorium  Memory:No data recorded Judgment:Poor  Insight:Poor   Executive Functions  Concentration:Fair  Attention Span:Fair  [place]   Psychomotor Activity  Psychomotor Activity:Psychomotor Activity: Normal   Assets  Assets:Social Support; Catering manager; Housing   Sleep  Sleep:Sleep: Poor    Physical Exam: Physical Exam Vitals and nursing note reviewed.  Constitutional:      Appearance: Normal appearance.  HENT:     Head: Normocephalic and atraumatic.     Mouth/Throat:     Pharynx: Oropharynx is clear.  Eyes:     Pupils: Pupils are equal, round, and reactive to light.  Cardiovascular:     Rate and Rhythm: Normal rate and regular rhythm.  Pulmonary:     Effort: Pulmonary effort is normal.     Breath sounds: Normal breath sounds.  Abdominal:     General: Abdomen is flat.     Palpations: Abdomen is soft.  Musculoskeletal:        General: Normal range of motion.  Skin:    General: Skin is warm and dry.  Neurological:     Mental Status: She is alert. Mental status is at baseline.     Comments: Multiple focal deficits chronic with family believes and weakness on the right side with expressive aphasia  Psychiatric:        Attention and Perception: She perceives auditory hallucinations.        Mood and Affect: Mood is anxious.  Speech: She is noncommunicative.        Behavior: Behavior is agitated. Behavior is not aggressive.        Thought Content: Thought content normal.        Cognition and Memory: Cognition is impaired.   Review of Systems  Constitutional: Negative.   HENT: Negative.    Eyes: Negative.   Respiratory: Negative.    Cardiovascular: Negative.   Gastrointestinal: Negative.   Musculoskeletal: Negative.   Skin: Negative.   Neurological: Negative.   Psychiatric/Behavioral:  Positive for hallucinations. Negative for depression, substance abuse and suicidal ideas. The patient is nervous/anxious.   Blood pressure 119/85, pulse (!) 102, temperature 98.2 F (36.8 C), temperature source Oral, resp. rate 17, height 5\' 4"  (1.626 m), weight 90.3 kg, SpO2 97 %. Body mass index is 34.16 kg/m.  Treatment Plan Summary: Medication management and Plan patient who has developed psychotic symptoms with paranoia hallucinations and ideas of reference over the past year.  Does not appear to meet criteria for depression.  Patient denies specific symptoms of depression.  Absolutely denies suicidal ideation.  Denies homicidal or violent ideation.  As part of the evaluation I checked her head CT again and there has been no change from her baseline.  I rechecked her chest CT as well because of a history of a nodule they were following and that has showed no change either.  Labs show an abnormal TSH suggesting her dose of Synthroid is too high.  Otherwise largely unremarkable except for a low potassium on presentation.  Unclear what is causing this.  Could be new onset psychotic symptoms as late manifestation of the stroke itself, could be a psychotic mood disorder, could be related to the [place] or other medication.  I reviewed a plan with the patient and her mother and they are agreeable.  Decrease Keppra dose to 500 mg twice a day because of risk that it is causing the symptoms.  Decrease Synthroid dose to 100 mcg/day.   Add 0.5 mg Risperdal at night.  Likely discharge tomorrow.  Observation Level/Precautions:  15 minute checks  Laboratory:  Chemistry Profile  Psychotherapy:    Medications:    Consultations:    Discharge Concerns:    Estimated LOS:  Other:     Physician Treatment Plan for Primary Diagnosis: Psychosis ([place]) Long Term Goal(s): Improvement in symptoms so as ready for discharge  Short Term Goals: Ability to demonstrate self-control will improve and Ability to identify and develop effective coping behaviors will improve  Physician Treatment Plan for Secondary Diagnosis: Principal Problem:   Psychosis ([place]) Active Problems:   H/O: CVA (cerebrovascular accident)   Hx of seizure disorder   Thyroid disease   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    Alethia Berthold, MD 1/18/20233:47 PM

## 2021-02-26 MED ORDER — LEVOTHYROXINE SODIUM 100 MCG PO TABS: 100.0000 ug | ORAL_TABLET | Freq: Every day | ORAL | 1 refills | Status: DC

## 2021-02-26 MED ORDER — LEVETIRACETAM 500 MG PO TABS: 500.0000 mg | ORAL_TABLET | Freq: Two times a day (BID) | ORAL | 1 refills | Status: DC

## 2021-02-26 MED ORDER — RISPERIDONE 0.5 MG PO TABS: 0.5000 mg | ORAL_TABLET | Freq: Every day | ORAL | 1 refills | Status: DC

## 2021-02-26 NOTE — Progress Notes (Signed)
[facility] LCSW Progress Note   02/26/2021    10:44 AM  Melissa Day   [id number]   Type of Contact and Topic:  Guardian Discharge Notification       02/26/21 1043  Legal Guardian  Does Patient Have a [place]? Yes  Legal Guardian Mother  Legal Guardian Contact Information Melissa Day, Mother, [phone]  Copy of Legal Guardianship Form in Chart No - copy requested  Legal Guardian Notified of Pending Discharge  Successfully notified    Signed:  Durenda Hurt, MSW, LCSWA, LCAS 02/26/2021 10:44 AM

## 2021-02-26 NOTE — Progress Notes (Signed)
D: Pt alert and oriented. Pt denies experiencing any pain, SI/HI, or AVH at this time. Pt reports she will be able to keep themselves safe when they return home.   A: Pt received discharge and medication education/information. Pt did not have belongings but was given clothes to wear home.   R: Pt verbalized understanding of discharge and medication education/information.   Pt escorted by staff to the medical mall front lobby and picked up by mother.

## 2021-02-26 NOTE — Discharge Summary (Signed)
Physician Discharge Summary Note  Patient:  Melissa Day is an 51 y.o., female MRN:  [id number] DOB:  December 23, 1970 Patient phone:  [phone] (home)  Patient address:   [place] [facility] [address],  Total Time spent with patient: 30 minutes  Date of Admission:  02/24/2021 Date of Discharge: 02/26/2021  Reason for Admission: Admitted through the emergency room where he presented with subacute paranoia and hallucinations in the context of chronic neurologic illness  Principal Problem: Psychosis [facility]) Discharge Diagnoses: Principal Problem:   Psychosis ([place]) Active Problems:   H/O: CVA (cerebrovascular accident)   Hx of seizure disorder   Thyroid disease   Hemiplegia as late effect of cerebrovascular disease ([place])   Past Psychiatric History: Past history of stroke.  No significant past psychiatric history  Past Medical History:  Past Medical History:  Diagnosis Date   Anemia    Cancer ([place]) 2018   br cancer on left with lump and rad tx    Diabetes mellitus without complication ([place])    Family history of colonic polyps    H/O: CVA (cerebrovascular accident) 2012   a. cerebral edema, craniotomy, residual right upper & lower limb weakness   Heart murmur    High cholesterol    History of colon polyps    History of echocardiogram    a. 12/01/2013: EF 50-55%, nl global LV systolic function, mild MR, mildly increased LV posterior wall thickness   History of kidney stones    History of stress test    a. 12/01/2013: no significant ischemia, no significant WMA, no EKG changes concerning for ischemia, EF 67%, low risk scan   Hx of seizure disorder    a. one episode 10 months after CVA, since then on Keppra    Hypothyroidism    Personal history of radiation therapy 2019   LEFT lumpectomy   Seizures ([place])    Stroke ([place]) 2012   Thyroid disease     Past Surgical History:  Procedure Laterality Date   BRAIN SURGERY     decompression after stroke   BREAST  BIOPSY Left 12/13/2016   path pending   BREAST LUMPECTOMY Left 01/06/2017   DUCTAL CARCINOMA IN SITU, NUCLEAR GRADE 3.   BREAST LUMPECTOMY WITH NEEDLE LOCALIZATION Left 01/06/2017   Procedure: BREAST LUMPECTOMY WITH NEEDLE LOCALIZATION;  Surgeon: Leonie Green, MD;  Location: [facility] ORS;  Service: General;  Laterality: Left;   COLONOSCOPY WITH PROPOFOL N/A 11/18/2014   Procedure: COLONOSCOPY WITH PROPOFOL;  Surgeon: Josefine Class, MD;  Location: [facility] ENDOSCOPY;  Service: Endoscopy;  Laterality: N/A;   COLONOSCOPY WITH PROPOFOL N/A 08/19/2020   Procedure: COLONOSCOPY WITH PROPOFOL;  Surgeon: Lesly Rubenstein, MD;  Location: [facility] ENDOSCOPY;  Service: Endoscopy;  Laterality: N/A;   CYSTOSCOPY/RETROGRADE/URETEROSCOPY/STONE EXTRACTION WITH BASKET Right 10/29/2014   Procedure: CYSTOSCOPY/URETEROSCOPY/STONE EXTRACTION WITH BASKET;  Surgeon: Franchot Gallo, MD;  Location: [facility] ORS;  Service: Urology;  Laterality: Right;   ESOPHAGOGASTRODUODENOSCOPY (EGD) WITH PROPOFOL N/A 11/18/2014   Procedure: ESOPHAGOGASTRODUODENOSCOPY (EGD) WITH PROPOFOL;  Surgeon: Josefine Class, MD;  Location: [facility] ENDOSCOPY;  Service: Endoscopy;  Laterality: N/A;   RE-EXCISION OF BREAST LUMPECTOMY Left 01/25/2017   Procedure: RE-EXCISION OF BREAST LUMPECTOMY;  Surgeon: Leonie Green, MD;  Location: [facility] ORS;  Service: General;  Laterality: Left;   SUBMANDIBULAR GLAND EXCISION Right 12/15/2017   Procedure: EXCISION SUBMANDIBULAR GLAND;  Surgeon: Carloyn Manner, MD;  Location: [facility] ORS;  Service: ENT;  Laterality: Right;   TONSILLECTOMY     Family History:  Family History  Problem Relation Age of Onset   Nephrolithiasis Mother    Non-Hodgkin's lymphoma Mother 6       currently 54   Heart disease Father    CVA Father    Nephrolithiasis Maternal Grandfather    Diabetes Maternal Grandfather    Pancreatic cancer Maternal Grandfather 39       deceased 2   Breast cancer Sister 56       negative  genetic testing; currently 18   Lymphoma Maternal Grandmother        deceased 72   Breast cancer Paternal Aunt        4 more paternal aunts with breast cancer dx 80s-70s   Breast cancer Cousin        2 paternal cousins; daughters of pat aunt w/ BC at 35   Colon cancer Cousin 66       son of mat aunt   Breast cancer Paternal Aunt 74       deceased 78 of MI   Family Psychiatric  History: None reported Social History:  Social History   Substance and Sexual Activity  Alcohol Use No     Social History   Substance and Sexual Activity  Drug Use No    Social History   Socioeconomic History   Marital status: Divorced    Spouse name: Not on file   Number of children: Not on file   Years of education: Not on file   Highest education level: Not on file  Occupational History   Not on file  Tobacco Use   Smoking status: Former    Packs/day: 1.00    Years: 15.00    Pack years: 15.00    Types: Cigarettes    Quit date: 12/30/2011    Years since quitting: 9.1   Smokeless tobacco: Never  Vaping Use   Vaping Use: Never used  Substance and Sexual Activity   Alcohol use: No   Drug use: No   Sexual activity: Never  Other Topics Concern   Not on file  Social History Narrative   Not on file   Social Determinants of Health   Financial Resource Strain: Not on file  Food Insecurity: Not on file  Transportation Needs: Not on file  Physical Activity: Not on file  Stress: Not on file  Social Connections: Not on file    Hospital Course: Patient admitted to psychiatric unit.  15-minute checks maintained.  Patient was able to do her own transfers self care toileting safely and did not require a one-to-one.  Patient and mother were contacted and together described several months worsening of paranoia and hallucinations primarily at night.  No report of suicidal or homicidal or dangerous thinking or behavior.  CT was obtained of the brain to rule out any new stroke or tumor and was  negative-the same as previous studies.  Chest CT also obtained because of a past history of chest nodule which proved to be the same as before as well.  Other labs notable were a suppressed TSH indicating excessive thyroid treatment.  Discussed options and possibilities with patient and mother.  Keppra was decreased to 500 mg twice a day as there is a chance that this medication could be responsible for some or all of the symptoms.  Decrease Synthroid down to 100 mcg a day because of the lab reading.  Initiated Risperdal 0.5 mg at night for symptoms.  Patient tolerated all this.  Discharging home at this point with recommended  follow-up with primary care doctor and outpatient mental health providers as needed.  Physical Findings: AIMS:  , ,  ,  ,    CIWA:    COWS:     Musculoskeletal: Strength & Muscle Tone: decreased Gait & Station: unsteady Patient leans: Right   Psychiatric Specialty Exam:  Presentation  General Appearance: Appropriate for Environment  Eye Contact:Good  Speech:Other (comment) (slt impairment, deficit freom stroke)  Speech Volume:Normal  Handedness:No data recorded  Mood and Affect  Mood:Euthymic  Affect:Appropriate   Thought Process  Thought Processes:Coherent  Descriptions of Associations:Loose  Orientation:Full (Time, Place and Person)  Thought Content:Illogical  History of Schizophrenia/Schizoaffective disorder:No  Duration of Psychotic Symptoms:Greater than six months  Hallucinations:No data recorded Ideas of Reference:Paranoia  Suicidal Thoughts:No data recorded Homicidal Thoughts:No data recorded  Sensorium  Memory:No data recorded Judgment:Poor  Insight:Poor   Executive Functions  Concentration:Fair  Attention Span:Fair  Friend   Psychomotor Activity  Psychomotor Activity:No data recorded  Assets  Assets:Social Support; Catering manager; Housing   Sleep   Sleep:No data recorded   Physical Exam: Physical Exam Vitals and nursing note reviewed.  Constitutional:      Appearance: Normal appearance.  HENT:     Head: Normocephalic and atraumatic.     Mouth/Throat:     Pharynx: Oropharynx is clear.  Eyes:     Pupils: Pupils are equal, round, and reactive to light.  Cardiovascular:     Rate and Rhythm: Normal rate and regular rhythm.  Pulmonary:     Effort: Pulmonary effort is normal.     Breath sounds: Normal breath sounds.  Abdominal:     General: Abdomen is flat.     Palpations: Abdomen is soft.  Musculoskeletal:        General: Normal range of motion.  Skin:    General: Skin is warm and dry.  Neurological:     General: No focal deficit present.     Mental Status: She is alert. Mental status is at baseline.  Psychiatric:        Attention and Perception: Attention normal.        Mood and Affect: Mood normal.        Speech: She is noncommunicative.        Behavior: Behavior is cooperative.        Thought Content: Thought content normal.        Cognition and Memory: Cognition normal.   Review of Systems  Constitutional: Negative.   HENT: Negative.    Eyes: Negative.   Respiratory: Negative.    Cardiovascular: Negative.   Gastrointestinal: Negative.   Musculoskeletal: Negative.   Skin: Negative.   Neurological:  Positive for focal weakness and weakness.  Psychiatric/Behavioral: Negative.    Blood pressure 139/84, pulse (!) 118, temperature 97.6 F (36.4 C), temperature source Oral, resp. rate 17, height 5\' 4"  (1.626 m), weight 90.3 kg, SpO2 98 %. Body mass index is 34.16 kg/m.   Social History   Tobacco Use  Smoking Status Former   Packs/day: 1.00   Years: 15.00   Pack years: 15.00   Types: Cigarettes   Quit date: 12/30/2011   Years since quitting: 9.1  Smokeless Tobacco Never   Tobacco Cessation:  N/A, patient does not currently use tobacco products   Blood Alcohol level:  Lab Results  Component Value  Date   ETH <10 35/46/5681    Metabolic Disorder Labs:  Lab Results  Component Value Date   HGBA1C 6.8 (  H) 02/25/2021   MPG 148.46 02/25/2021   No results found for: PROLACTIN Lab Results  Component Value Date   CHOL 120 02/25/2021   TRIG 76 02/25/2021   HDL 43 02/25/2021   CHOLHDL 2.8 02/25/2021   VLDL 15 02/25/2021   LDLCALC 62 02/25/2021   LDLCALC 94 12/01/2013    See Psychiatric Specialty Exam and Suicide Risk Assessment completed by Attending Physician prior to discharge.  Discharge destination:  Home  Is patient on multiple antipsychotic therapies at discharge:  No   Has Patient had three or more failed trials of antipsychotic monotherapy by history:  No  Recommended Plan for Multiple Antipsychotic Therapies: NA  Discharge Instructions     Diet - low sodium heart healthy   Complete by: As directed    Increase activity slowly   Complete by: As directed       Allergies as of 02/26/2021   No Known Allergies      Medication List     TAKE these medications      Indication  aspirin EC 81 MG tablet Take 81 mg by mouth daily.  Indication: Temporary Stroke   atorvastatin 40 MG tablet Commonly known as: LIPITOR Take 1 tablet (40 mg total) by mouth daily.  Indication: High Amount of Fats in the Blood   baclofen 10 MG tablet Commonly known as: LIORESAL Take 5 mg by mouth 2 (two) times daily.  Indication: Muscle Spasm   FLUoxetine 40 MG capsule Commonly known as: PROZAC Take 40 mg by mouth daily.  Indication: Depression   levETIRAcetam 500 MG tablet Commonly known as: KEPPRA Take 1 tablet (500 mg total) by mouth 2 (two) times daily. What changed:  medication strength how much to take  Indication: Seizure   levothyroxine 100 MCG tablet Commonly known as: SYNTHROID Take 1 tablet (100 mcg total) by mouth daily before breakfast. Start taking on: February 27, 2021 What changed:  medication strength how much to take  Indication: Underactive  Thyroid   losartan 50 MG tablet Commonly known as: COZAAR Take 50 mg by mouth daily.  Indication: High Blood Pressure Disorder   metFORMIN 500 MG tablet Commonly known as: GLUCOPHAGE Take 500 mg by mouth daily with breakfast.  Indication: Type 2 Diabetes   risperiDONE 0.5 MG tablet Commonly known as: RISPERDAL Take 1 tablet (0.5 mg total) by mouth at bedtime.  Indication: Psychosis medical         Follow-up recommendations: Prescriptions provided only for medications new or altered during hospitalization.  Follow up with primary care doctor and mental health providers as needed  Comments: See above.  Signed: Alethia Berthold, MD 02/26/2021, 10:00 AM

## 2021-02-26 NOTE — Discharge Instructions (Signed)
Should [facility] not have availability, please review the following outpatient providers in [facility]:   [facility] [id number]  [place], [place] [address] [phone]   [facility]  [address] [place], [place] [address] [phone]  [facility]  [address] [place], [place] [address] [phone]  [facility]  [address] [place]  [place], [place] [address] [phone]  [facility]  [place], [place] [address] [phone]  [facility]  [address],  [place], [place] [address] [phone]

## 2021-02-26 NOTE — Care Management Important Message (Signed)
Important Message  Patient Details  Name: TENELLE ANDREASON MRN: [id number] Date of Birth: [birth year]   Medicare Important Message Given:  Yes     Rozann Lesches, LCSW 02/26/2021, 9:13 AM

## 2021-02-26 NOTE — Progress Notes (Signed)
Recreation Therapy Notes  Date: 02/26/2021  Time: 10:15am    Location: Courtyard     Behavioral response: N/A   Intervention Topic: Leisure    Discussion/Intervention: Patient did not attend group.   Clinical Observations/Feedback:  Patient did not attend group.    Nihar Klus LRT/CTRS          Taite Schoeppner 02/26/2021 12:01 PM

## 2021-02-26 NOTE — BHH Suicide Risk Assessment (Signed)
[facility] Discharge Suicide Risk Assessment   Principal Problem: Psychosis [facility]) Discharge Diagnoses: Principal Problem:   Psychosis ([place]) Active Problems:   H/O: CVA (cerebrovascular accident)   Hx of seizure disorder   Thyroid disease   Hemiplegia as late effect of cerebrovascular disease ([place])   Total Time spent with patient: 30 minutes  Musculoskeletal: Strength & Muscle Tone: within normal limits Gait & Station: normal Patient leans: N/A  Psychiatric Specialty Exam  Presentation  General Appearance: Appropriate for Environment  Eye Contact:Good  Speech:Other (comment) (slt impairment, deficit freom stroke)  Speech Volume:Normal  Handedness:No data recorded  Mood and Affect  Mood:Euthymic  Duration of Depression Symptoms: No data recorded Affect:Appropriate   Thought Process  Thought Processes:Coherent  Descriptions of Associations:Loose  Orientation:Full (Time, Place and Person)  Thought Content:Illogical  History of Schizophrenia/Schizoaffective disorder:No  Duration of Psychotic Symptoms:Greater than six months  Hallucinations:No data recorded Ideas of Reference:Paranoia  Suicidal Thoughts:No data recorded Homicidal Thoughts:No data recorded  Sensorium  Memory:No data recorded Judgment:Poor  Insight:Poor   Executive Functions  Concentration:Fair  Attention Span:Fair  [place]   Psychomotor Activity  Psychomotor Activity:No data recorded  Assets  Assets:Social Support; Catering manager; Housing   Sleep  Sleep:No data recorded  Physical Exam: Physical Exam Vitals and nursing note reviewed.  Constitutional:      Appearance: Normal appearance.  HENT:     Head: Normocephalic and atraumatic.     Mouth/Throat:     Pharynx: Oropharynx is clear.  Eyes:     Pupils: Pupils are equal, round, and reactive to light.  Cardiovascular:     Rate and Rhythm: Normal rate and regular  rhythm.  Pulmonary:     Effort: Pulmonary effort is normal.     Breath sounds: Normal breath sounds.  Abdominal:     General: Abdomen is flat.     Palpations: Abdomen is soft.  Musculoskeletal:        General: Normal range of motion.  Skin:    General: Skin is warm and dry.  Neurological:     Mental Status: She is alert. Mental status is at baseline.     Comments: Past history of stroke with hemiplegia but stable when walking on indoor ground.  Continues to have weakness on the right side of course and expressive aphasia.  Psychiatric:        Attention and Perception: Attention normal.        Mood and Affect: Mood normal.        Speech: She is noncommunicative.        Behavior: Behavior is cooperative.        Thought Content: Thought content normal.        Cognition and Memory: Cognition normal.   Review of Systems  Constitutional: Negative.   HENT: Negative.    Eyes: Negative.   Respiratory: Negative.    Cardiovascular: Negative.   Gastrointestinal: Negative.   Musculoskeletal: Negative.   Skin: Negative.   Neurological: Negative.   Psychiatric/Behavioral: Negative.    Blood pressure 139/84, pulse (!) 118, temperature 97.6 F (36.4 C), temperature source Oral, resp. rate 17, height 5\' 4"  (1.626 m), weight 90.3 kg, SpO2 98 %. Body mass index is 34.16 kg/m.  Mental Status Per Nursing Assessment::   On Admission:  NA  Demographic Factors:  Caucasian  Loss Factors: Decline in physical health  Historical Factors: NA  Risk Reduction Factors:   Sense of responsibility to family, Living with another person, especially a  relative, Positive social support, and Positive therapeutic relationship  Continued Clinical Symptoms:  Medical Diagnoses and Treatments/Surgeries  Cognitive Features That Contribute To Risk:  None    Suicide Risk:  Minimal: No identifiable suicidal ideation.  Patients presenting with no risk factors but with morbid ruminations; may be classified as  minimal risk based on the severity of the depressive symptoms    Plan Of Care/Follow-up recommendations:  Patient will be discharged back to her home in the company of her family.  Small changes made in medicine.  Recommendation made for general medical and mental health follow-up.  Absolutely no mention of any suicidal ideation.  Affect upbeat.  Patient and family agreeable.  Alethia Berthold, MD 02/26/2021, 9:53 AM

## 2021-02-26 NOTE — Progress Notes (Signed)
°  [facility] Adult Case Management Discharge Plan :  Will you be returning to the same living situation after discharge:  Yes,  pt is going back to her place of residence.  At discharge, do you have transportation home?: Yes,  pt mother will pick her up.  Do you have the ability to pay for your medications: Yes,  pt has [place] [employer]/Medicare   Release of information consent forms completed and in the chart;  Patient's signature needed at discharge.  Patient to Follow up at:  Follow-up Information     [facility] Follow up.   Specialty: Behavioral Health Why: A referral has been submitted on your behalf, a representative will be reaching out to you to discuss scheduling. If you have not heard from the clinic in 2 days, please call the number provided. Contact information: [place] [phone]                Next level of care provider has access to [place] and Suicide Prevention discussed: Yes,  CSW completed it  with pt mother Baker Janus     Has patient been referred to the Quitline?: Patient refused referral  Patient has been referred for addiction treatment: Pt. refused referral  Karalee Height, [place] [facility] 02/26/2021, 11:04 AM

## 2021-02-26 NOTE — BHH Suicide Risk Assessment (Signed)
[facility] INPATIENT:  Family/Significant Other Suicide Prevention Education  Suicide Prevention Education:  Education Completed; Melissa Day) Melissa Day (Mother/Legal Guardian??) [phone], has been identified by the patient as the family member/significant other with whom the patient will be residing, and identified as the person(s) who will aid the patient in the event of a mental health crisis (suicidal ideations/suicide attempt).  With written consent from the patient, the family member/significant other has been provided the following suicide prevention education, prior to the and/or following the discharge of the patient.  The suicide prevention education provided includes the following: Suicide risk factors Suicide prevention and interventions National Suicide Hotline telephone number [facility] assessment telephone number [facility] Emergency Assistance [place] and/or Residential Mobile Crisis Unit telephone number  Request made of family/significant other to: Remove weapons (e.g., guns, rifles, knives), all items previously/currently identified as safety concern.   Remove drugs/medications (over-the-counter, prescriptions, illicit drugs), all items previously/currently identified as a safety concern.  The family member/significant other verbalizes understanding of the suicide prevention education information provided.  The family member/significant other agrees to remove the items of safety concern listed above.  Melissa Day 02/26/2021, 8:16 AM

## 2021-02-26 NOTE — Progress Notes (Signed)
Patient is calm and cooperative this shift up for breakfast. Resting in bed this morning. Denies SI/HI/AVH anxiety and depression. Reports mild pain at a 1/10 to Left knee.   Takes am medication as prescribed. Plan for discharge today.   No s/s of distress noted. Cont Q15 minute check for safety.

## 2021-02-26 NOTE — Progress Notes (Signed)
Patient is pleasant and cooperative.  She is now in her room, but has been active on the unit hanging out in the dayroom watching TV Appears depressed and flat, denies si  hi avh at this encounter.  She is med compliant with her QHS med and received without incident.Will continue to monitor with Q 15 minute safety checks. Encouraged her to seek staff with any concerns.     Cleo Butler-Nicholson, LPN

## 2021-03-02 ENCOUNTER — Encounter: Payer: Self-pay | Admitting: Emergency Medicine

## 2021-03-02 ENCOUNTER — Other Ambulatory Visit: Payer: Self-pay

## 2021-03-02 ENCOUNTER — Emergency Department
Admission: EM | Admit: 2021-03-02 | Discharge: 2021-03-03 | Disposition: A | Discharge status: Other Acute Inpatient Hospital | Payer: Medicare Other | Source: Home / Self Care | Attending: Emergency Medicine | Admitting: Emergency Medicine

## 2021-03-02 DIAGNOSIS — Z20822 Contact with and (suspected) exposure to covid-19: Secondary | ICD-10-CM | POA: Insufficient documentation

## 2021-03-02 DIAGNOSIS — R443 Hallucinations, unspecified: Secondary | ICD-10-CM | POA: Diagnosis present

## 2021-03-02 DIAGNOSIS — F3341 Major depressive disorder, recurrent, in partial remission: Secondary | ICD-10-CM | POA: Diagnosis present

## 2021-03-02 DIAGNOSIS — E1165 Type 2 diabetes mellitus with hyperglycemia: Secondary | ICD-10-CM | POA: Insufficient documentation

## 2021-03-02 DIAGNOSIS — Z8669 Personal history of other diseases of the nervous system and sense organs: Secondary | ICD-10-CM

## 2021-03-02 DIAGNOSIS — E039 Hypothyroidism, unspecified: Secondary | ICD-10-CM | POA: Insufficient documentation

## 2021-03-02 DIAGNOSIS — Z7982 Long term (current) use of aspirin: Secondary | ICD-10-CM | POA: Insufficient documentation

## 2021-03-02 DIAGNOSIS — F09 Unspecified mental disorder due to known physiological condition: Secondary | ICD-10-CM | POA: Diagnosis not present

## 2021-03-02 DIAGNOSIS — F29 Unspecified psychosis not due to a substance or known physiological condition: Secondary | ICD-10-CM | POA: Insufficient documentation

## 2021-03-02 DIAGNOSIS — F32A Depression, unspecified: Secondary | ICD-10-CM | POA: Diagnosis present

## 2021-03-02 DIAGNOSIS — Z7984 Long term (current) use of oral hypoglycemic drugs: Secondary | ICD-10-CM | POA: Insufficient documentation

## 2021-03-02 DIAGNOSIS — R440 Auditory hallucinations: Secondary | ICD-10-CM | POA: Diagnosis present

## 2021-03-02 DIAGNOSIS — Z87891 Personal history of nicotine dependence: Secondary | ICD-10-CM | POA: Insufficient documentation

## 2021-03-02 DIAGNOSIS — Z79899 Other long term (current) drug therapy: Secondary | ICD-10-CM | POA: Insufficient documentation

## 2021-03-02 LAB — COMPREHENSIVE METABOLIC PANEL
ALT: 22 U/L (ref 0–44)
AST: 21 U/L (ref 15–41)
Albumin: 4.4 g/dL (ref 3.5–5.0)
Alkaline Phosphatase: 90 U/L (ref 38–126)
Anion gap: 12 (ref 5–15)
BUN: 6 mg/dL (ref 6–20)
CO2: 21 mmol/L — ABNORMAL LOW (ref 22–32)
Calcium: 9.2 mg/dL (ref 8.9–10.3)
Chloride: 103 mmol/L (ref 98–111)
Creatinine, Ser: 0.53 mg/dL (ref 0.44–1.00)
GFR, Estimated: >60 mL/min (ref >60)
Glucose, Bld: 170 mg/dL — ABNORMAL HIGH (ref 70–99)
Potassium: 3.2 mmol/L — ABNORMAL LOW (ref 3.5–5.1)
Sodium: 136 mmol/L (ref 135–145)
Total Bilirubin: 0.7 mg/dL (ref 0.3–1.2)
Total Protein: 7.6 g/dL (ref 6.5–8.1)

## 2021-03-02 LAB — URINE DRUG SCREEN, QUALITATIVE (ARMC ONLY)
Amphetamines, Ur Screen: NOT DETECTED
Barbiturates, Ur Screen: NOT DETECTED
Benzodiazepine, Ur Scrn: NOT DETECTED
Cannabinoid 50 Ng, Ur ~~LOC~~: NOT DETECTED
Cocaine Metabolite,Ur ~~LOC~~: NOT DETECTED
MDMA (Ecstasy)Ur Screen: NOT DETECTED
Methadone Scn, Ur: NOT DETECTED
Opiate, Ur Screen: NOT DETECTED
Phencyclidine (PCP) Ur S: NOT DETECTED
Tricyclic, Ur Screen: NOT DETECTED

## 2021-03-02 LAB — CBC
HCT: 39.2 % (ref 36.0–46.0)
Hemoglobin: 13.4 g/dL (ref 12.0–15.0)
MCH: 29.1 pg (ref 26.0–34.0)
MCHC: 34.2 g/dL (ref 30.0–36.0)
MCV: 85.2 fL (ref 80.0–100.0)
Platelets: 346 10*3/uL (ref 150–400)
RBC: 4.6 MIL/uL (ref 3.87–5.11)
RDW: 13.2 % (ref 11.5–15.5)
WBC: 13.5 10*3/uL — ABNORMAL HIGH (ref 4.0–10.5)
nRBC: 0 % (ref 0.0–0.2)

## 2021-03-02 LAB — ETHANOL: Alcohol, Ethyl (B): <10 mg/dL (ref <10)

## 2021-03-02 LAB — SALICYLATE LEVEL: Salicylate Lvl: <7 mg/dL — ABNORMAL LOW (ref 7.0–30.0)

## 2021-03-02 LAB — ACETAMINOPHEN LEVEL: Acetaminophen (Tylenol), Serum: <10 ug/mL — ABNORMAL LOW (ref 10–30)

## 2021-03-02 MED ORDER — ASPIRIN EC 81 MG PO TBEC
81.0000 mg | DELAYED_RELEASE_TABLET | Freq: Every day | ORAL | Status: DC
  Administered 2021-03-03: 09:00:00 81 mg via ORAL
  Filled 2021-03-02: qty 1

## 2021-03-02 MED ORDER — LEVOTHYROXINE SODIUM 75 MCG PO TABS
100.0000 ug | ORAL_TABLET | Freq: Every day | ORAL | Status: DC
  Administered 2021-03-03: 06:00:00 100 ug via ORAL
  Filled 2021-03-02: qty 2

## 2021-03-02 MED ORDER — LEVETIRACETAM 500 MG PO TABS
500.0000 mg | ORAL_TABLET | Freq: Two times a day (BID) | ORAL | Status: DC
  Administered 2021-03-03 (×2): 500 mg via ORAL
  Filled 2021-03-02 (×3): qty 1

## 2021-03-02 MED ORDER — BACLOFEN 10 MG PO TABS
5.0000 mg | ORAL_TABLET | Freq: Two times a day (BID) | ORAL | Status: DC
  Administered 2021-03-03 (×2): 5 mg via ORAL
  Filled 2021-03-02 (×4): qty 0.5

## 2021-03-02 MED ORDER — ATORVASTATIN CALCIUM 20 MG PO TABS
40.0000 mg | ORAL_TABLET | Freq: Every day | ORAL | Status: DC
  Administered 2021-03-03: 09:00:00 40 mg via ORAL
  Filled 2021-03-02: qty 2

## 2021-03-02 MED ORDER — METFORMIN HCL 500 MG PO TABS
500.0000 mg | ORAL_TABLET | Freq: Every day | ORAL | Status: DC
  Administered 2021-03-03: 09:00:00 500 mg via ORAL
  Filled 2021-03-02: qty 1

## 2021-03-02 MED ORDER — LOSARTAN POTASSIUM 50 MG PO TABS
50.0000 mg | ORAL_TABLET | Freq: Every day | ORAL | Status: DC
  Administered 2021-03-03: 09:00:00 50 mg via ORAL
  Filled 2021-03-02: qty 1

## 2021-03-02 MED ORDER — RISPERIDONE 1 MG PO TABS
0.5000 mg | ORAL_TABLET | Freq: Every day | ORAL | Status: DC
  Administered 2021-03-03: 0.5 mg via ORAL
  Filled 2021-03-02: qty 1

## 2021-03-02 MED ORDER — FLUOXETINE HCL 20 MG PO CAPS
40.0000 mg | ORAL_CAPSULE | Freq: Every day | ORAL | Status: DC
  Administered 2021-03-03: 09:00:00 40 mg via ORAL
  Filled 2021-03-02: qty 2

## 2021-03-02 NOTE — ED Provider Notes (Signed)
[facility] Provider Note    Event Date/Time   First MD Initiated Contact with Patient 03/02/21 1928     (approximate)   History   Hallucinations   HPI  Melissa Day is a 51 y.o. female with extensive past medical history as including history of seizure disorder, history of CVA, history of diabetes auditory hallucinations and psychosis as well as major depressive disorder who presents with reported hallucinations.  Patient brought in by [facility] Department, mother took out involuntary commitment papers and the patient because she is agitated and "psychotic ".  Patient does complain of hearing voices and that her apartment is bugged     Physical Exam   Triage Vital Signs: ED Triage Vitals  Enc Vitals Group     BP 03/02/21 1752 (!) 149/103     Pulse Rate 03/02/21 1752 (!) 125     Resp 03/02/21 1752 20     Temp 03/02/21 1752 98 F (36.7 C)     Temp Source 03/02/21 1752 Oral     SpO2 03/02/21 1752 100 %     Weight 03/02/21 1753 91.2 kg (201 lb)     Height 03/02/21 1753 1.626 m (5\' 4" )     Head Circumference --      Peak Flow --      Pain Score 03/02/21 1753 0     Pain Loc --      Pain Edu? --      Excl. in [place]? --     Most recent vital signs: Vitals:   03/02/21 1752  BP: (!) 149/103  Pulse: (!) 125  Resp: 20  Temp: 98 F (36.7 C)  SpO2: 100%     General: Awake, no distress.  CV:  Good peripheral perfusion.  Resp:  Normal effort.  Abd:  No distention.  Other:  Patient is paranoid does report auditory hallucinations, no aggression or agitation here   ED Results / Procedures / Treatments   Labs (all labs ordered are listed, but only abnormal results are displayed) Labs Reviewed  COMPREHENSIVE METABOLIC PANEL - Abnormal; Notable for the following components:      Result Value   Potassium 3.2 (*)    CO2 21 (*)    Glucose, Bld 170 (*)    All other components within normal limits  SALICYLATE LEVEL - Abnormal; Notable for  the following components:   Salicylate Lvl <7.6 (*)    All other components within normal limits  ACETAMINOPHEN LEVEL - Abnormal; Notable for the following components:   Acetaminophen (Tylenol), Serum <10 (*)    All other components within normal limits  CBC - Abnormal; Notable for the following components:   WBC 13.5 (*)    All other components within normal limits  ETHANOL  URINE DRUG SCREEN, QUALITATIVE ([facility] ONLY)  POC URINE PREG, ED     EKG     RADIOLOGY     PROCEDURES:  Critical Care performed:   Procedures   MEDICATIONS ORDERED IN ED: Medications - No data to display   IMPRESSION / MDM / [place] / ED COURSE  I reviewed the triage vital signs and the nursing notes.  Patient with a history of major depressive disorder, psychosis presents with hallucinations and hearing voices.  Will consult psych and TTS.  Patient's lab work is overall reassuring, normal UDS, normal CBC, mild elevation of white blood cell count is nonspecific, BMP is overall unremarkable  Patient is medically cleared for psychiatric evaluation  The patient has been placed in psychiatric observation due to the need to provide a safe environment for the patient while obtaining psychiatric consultation and evaluation, as well as ongoing medical and medication management to treat the patient's condition.  The patient has been placed under full IVC at this time.           FINAL CLINICAL IMPRESSION(S) / ED DIAGNOSES   Final diagnoses:  Psychosis, unspecified psychosis type ([place])     Rx / DC Orders   ED Discharge Orders     None        Note:  This document was prepared using Dragon voice recognition software and may include unintentional dictation errors.   Lavonia Drafts, MD 03/02/21 2241

## 2021-03-02 NOTE — ED Triage Notes (Signed)
Pt to ED via [facility] police department with IVC papers. Papers state that pt is psychotic and highly agitated. Mother took out papers on pt. Pt reports that she is hearing voices and that her apartment is bugged.

## 2021-03-03 ENCOUNTER — Encounter: Payer: Self-pay | Admitting: Psychiatry

## 2021-03-03 ENCOUNTER — Inpatient Hospital Stay
Admission: AD | Admit: 2021-03-03 | Discharge: 2021-03-11 | DRG: 884 | Disposition: A | Discharge status: Home / Self Care | Payer: Medicare Other | Source: Intra-hospital | Attending: Psychiatry | Admitting: Psychiatry

## 2021-03-03 DIAGNOSIS — Z87891 Personal history of nicotine dependence: Secondary | ICD-10-CM

## 2021-03-03 DIAGNOSIS — Z7982 Long term (current) use of aspirin: Secondary | ICD-10-CM

## 2021-03-03 DIAGNOSIS — E119 Type 2 diabetes mellitus without complications: Secondary | ICD-10-CM | POA: Diagnosis present

## 2021-03-03 DIAGNOSIS — Z7984 Long term (current) use of oral hypoglycemic drugs: Secondary | ICD-10-CM

## 2021-03-03 DIAGNOSIS — I10 Essential (primary) hypertension: Secondary | ICD-10-CM | POA: Diagnosis present

## 2021-03-03 DIAGNOSIS — Z20822 Contact with and (suspected) exposure to covid-19: Secondary | ICD-10-CM | POA: Diagnosis present

## 2021-03-03 DIAGNOSIS — F09 Unspecified mental disorder due to known physiological condition: Secondary | ICD-10-CM | POA: Diagnosis not present

## 2021-03-03 DIAGNOSIS — Z8673 Personal history of transient ischemic attack (TIA), and cerebral infarction without residual deficits: Secondary | ICD-10-CM | POA: Diagnosis not present

## 2021-03-03 DIAGNOSIS — R4701 Aphasia: Secondary | ICD-10-CM | POA: Diagnosis present

## 2021-03-03 DIAGNOSIS — Z8669 Personal history of other diseases of the nervous system and sense organs: Secondary | ICD-10-CM

## 2021-03-03 LAB — RESP PANEL BY RT-PCR (FLU A&B, COVID) ARPGX2
Influenza A by PCR: NEGATIVE
Influenza B by PCR: NEGATIVE
SARS Coronavirus 2 by RT PCR: NEGATIVE

## 2021-03-03 LAB — PREGNANCY, URINE: Preg Test, Ur: NEGATIVE

## 2021-03-03 MED ORDER — ACETAMINOPHEN 325 MG PO TABS
650.0000 mg | ORAL_TABLET | Freq: Four times a day (QID) | ORAL | Status: DC | PRN
  Administered 2021-03-04 – 2021-03-08 (×2): 650 mg via ORAL
  Filled 2021-03-03 (×2): qty 2

## 2021-03-03 MED ORDER — ALUM & MAG HYDROXIDE-SIMETH 200-200-20 MG/5ML PO SUSP: 30.0000 mL | ORAL | Status: DC | PRN

## 2021-03-03 MED ORDER — METFORMIN HCL 500 MG PO TABS
500.0000 mg | ORAL_TABLET | Freq: Two times a day (BID) | ORAL | Status: DC
  Administered 2021-03-03 – 2021-03-11 (×16): 500 mg via ORAL
  Filled 2021-03-03 (×16): qty 1

## 2021-03-03 MED ORDER — FLUOXETINE HCL 20 MG PO CAPS
40.0000 mg | ORAL_CAPSULE | Freq: Every day | ORAL | Status: DC
  Administered 2021-03-03 – 2021-03-04 (×2): 40 mg via ORAL
  Filled 2021-03-03 (×2): qty 2

## 2021-03-03 MED ORDER — ASPIRIN EC 81 MG PO TBEC
81.0000 mg | DELAYED_RELEASE_TABLET | Freq: Every day | ORAL | Status: DC
  Administered 2021-03-04 – 2021-03-11 (×8): 81 mg via ORAL
  Filled 2021-03-03 (×8): qty 1

## 2021-03-03 MED ORDER — LEVOTHYROXINE SODIUM 100 MCG PO TABS
100.0000 ug | ORAL_TABLET | Freq: Every day | ORAL | Status: DC
  Administered 2021-03-04 – 2021-03-11 (×8): 100 ug via ORAL
  Filled 2021-03-03 (×8): qty 1

## 2021-03-03 MED ORDER — LEVETIRACETAM 500 MG PO TABS
500.0000 mg | ORAL_TABLET | Freq: Two times a day (BID) | ORAL | Status: DC
  Administered 2021-03-03 – 2021-03-11 (×16): 500 mg via ORAL
  Filled 2021-03-03 (×17): qty 1

## 2021-03-03 MED ORDER — MAGNESIUM HYDROXIDE 400 MG/5ML PO SUSP: 30.0000 mL | Freq: Every day | ORAL | Status: DC | PRN

## 2021-03-03 MED ORDER — LOSARTAN POTASSIUM 50 MG PO TABS
50.0000 mg | ORAL_TABLET | Freq: Every day | ORAL | Status: DC
  Administered 2021-03-04 – 2021-03-11 (×8): 50 mg via ORAL
  Filled 2021-03-03 (×8): qty 1

## 2021-03-03 MED ORDER — ATORVASTATIN CALCIUM 20 MG PO TABS
40.0000 mg | ORAL_TABLET | Freq: Every day | ORAL | Status: DC
  Administered 2021-03-03 – 2021-03-10 (×8): 40 mg via ORAL
  Filled 2021-03-03 (×8): qty 2

## 2021-03-03 MED ORDER — RISPERIDONE 1 MG PO TABS
1.0000 mg | ORAL_TABLET | Freq: Every day | ORAL | Status: DC
  Administered 2021-03-03: 20:00:00 1 mg via ORAL
  Filled 2021-03-03: qty 1

## 2021-03-03 NOTE — ED Notes (Signed)
Declined snack.

## 2021-03-03 NOTE — BH Assessment (Signed)
Comprehensive Clinical Assessment (CCA) Note  03/03/2021 Melissa Day [id number] Recommendations for Services/Supports/Treatments: Consulted with Lynder Parents., NP, who determined that pt. meets inpatient psychiatric criteria. Notified Dr. Corky Downs and Joellen Jersey, RN of disposition recommendation.   Melissa Day is a 51 year old, English speaking, Caucasian female with a hx of paranoia, depression, and anxiety. Of note pt was recently discharged from in the inpatient unit at [facility]. Pt presented to [facility] ED involuntarily and was IVC'd by her mother/legal guardian due to a continuation of paranoid delusions. Pt was sitting quietly on bed upon examiner's arrival and appeared slightly anxious. Pt was cooperative throughout the assessment, however due to an articulation error was unable to communicate clearly. Pt had no insight about why her mother is concerned about her mental status and continues endorse persecutory delusions, pointing upstairs, eluding to her neighbors wanting her Medicaid number. Pt reported that she complies with her medications. Pt's protective factors are having stable housing, family supports, and a live-in boyfriend. Pt is currently on disability and lives in a low-income housing apartment. The pt. presented with irrelevant and obsessive thought content. The pt. denied current SI/HI/AV/H. Pt denies substance use. Pt was oriented x4. Pt's mood was anxious; affect was congruent. Pt had unremarkable psychomotor activity. The pt. did not appear to be responding to internal/external stimuli.  Collateral: [name]" (Mother/Legal Guardian) [phone] explained that the pt was not stable upon being released from the hospital on 02/26/21. Mother explained that the pt. continues to be obsessed with her neighbors and has even knocked on their door asking to speak to the woman of the house. Mother reported that the pt.'s delusions remain unchanged and pt continues to respond with  hypervigilance. Mother reported that the pt did not resist coming back to the ED for this encounter. Mother reported that the pt needs help. Chief Complaint:  Chief Complaint  Patient presents with   Hallucinations   Visit Diagnosis: Hx of seizure disorder   Clinical depression   Type 2 diabetes mellitus with hyperglycemia, without long-term current use of insulin (HCC)   Recurrent major depressive disorder, in partial remission ([place])   Auditory hallucination   Psychosis ([place])    CCA Screening, Triage and Referral (STR)  Patient Reported Information How did you hear about [place]? Family/Friend  Referral name: No data recorded Referral phone number: No data recorded  Whom do you see for routine medical problems? No data recorded Practice/Facility Name: No data recorded Practice/Facility Phone Number: No data recorded Name of Contact: No data recorded Contact Number: No data recorded Contact Fax Number: No data recorded Prescriber Name: No data recorded Prescriber Address (if known): No data recorded  What Is the Reason for Your Visit/Call Today? Pt presented to the ED with worsening symptoms of paranoia.  How Long Has This Been Causing You Problems? > than 6 months  What Do You Feel Would Help You the Most Today? Treatment for Depression or other mood problem   Have You Recently Been in Any Inpatient Treatment (Hospital/Detox/Crisis Center/28-Day Program)? No data recorded Name/Location of Program/Hospital:No data recorded How Long Were You There? No data recorded When Were You Discharged? No data recorded  Have You Ever Received Services From [facility] Before? No data recorded Who Do You See at [facility]? No data recorded  Have You Recently Had Any Thoughts About Hurting Yourself? No  Are You Planning to Commit Suicide/Harm Yourself At This time? No   Have you Recently Had Thoughts About Hurting Someone  Else? No  Explanation: No data recorded  Have You Used Any  Alcohol or Drugs in the Past 24 Hours? No  How Long Ago Did You Use Drugs or Alcohol? No data recorded What Did You Use and How Much? No data recorded  Do You Currently Have a Therapist/Psychiatrist? No  Name of Therapist/Psychiatrist: No data recorded  Have You Been Recently Discharged From Any Office Practice or Programs? No  Explanation of Discharge From Practice/Program: No data recorded    CCA Screening Triage Referral Assessment Type of Contact: Face-to-Face  Is this Initial or Reassessment? No data recorded Date Telepsych consult ordered in [facility]:  No data recorded Time Telepsych consult ordered in [facility]:  No data recorded  Patient Reported Information Reviewed? No data recorded Patient Left Without Being Seen? No data recorded Reason for Not Completing Assessment: No data recorded  Collateral Involvement: Legal Guardian/ Ellyn Hack (mom) [phone]   Does Patient Have a Court Appointed Legal Guardian? No data recorded Name and Contact of Legal Guardian: No data recorded If Minor and Not Living with Parent(s), Who has Custody? n/a  Is CPS involved or ever been involved? Never  Is APS involved or ever been involved? Never   Patient Determined To Be At Risk for Harm To Self or Others Based on Review of Patient Reported Information or Presenting Complaint? Yes, for Self-Harm  Method: No data recorded Availability of Means: No data recorded Intent: No data recorded Notification Required: No data recorded Additional Information for Danger to Others Potential: No data recorded Additional Comments for Danger to Others Potential: No data recorded Are There Guns or Other Weapons in Your Home? No data recorded Types of Guns/Weapons: No data recorded Are These Weapons Safely Secured?                            No data recorded Who Could Verify You Are Able To Have These Secured: No data recorded Do You Have any Outstanding Charges, Pending Court Dates, Parole/Probation? No  data recorded Contacted To Inform of Risk of Harm To Self or Others: No data recorded  Location of Assessment: [facility] ED   Does Patient Present under Involuntary Commitment? No  IVC Papers Initial File Date: No data recorded  [place] of Residence: [place]   Patient Currently Receiving the Following Services: Medication Management   Determination of Need: Emergent (2 hours)   Options For Referral: ED Referral; Therapeutic Triage Services     CCA Biopsychosocial Intake/Chief Complaint:  No data recorded Current Symptoms/Problems: No data recorded  Patient Reported Schizophrenia/Schizoaffective Diagnosis in Past: No   Strengths: Pt has stable housing  Preferences: No data recorded Abilities: No data recorded  Type of Services Patient Feels are Needed: No data recorded  Initial Clinical Notes/Concerns: No data recorded  Mental Health Symptoms Depression:   None   Duration of Depressive symptoms: No data recorded  Mania:   Recklessness; Change in energy/activity   Anxiety:    Worrying; Tension; Irritability   Psychosis:   Delusions   Duration of Psychotic symptoms:  Greater than six months   Trauma:   N/A   Obsessions:   Cause anxiety; Recurrent & persistent thoughts/impulses/images; Attempts to suppress/neutralize; Intrusive/time consuming; Disrupts routine/functioning; Poor insight   Compulsions:   "Driven" to perform behaviors/acts; Absent insight/delusional; Intended to reduce stress or prevent another outcome; Intrusive/time consuming; Disrupts with routine/functioning; Repeated behaviors/mental acts   Inattention:   None   Hyperactivity/Impulsivity:   None  Oppositional/Defiant Behaviors:   Angry; Easily annoyed   Emotional Irregularity:   Potentially harmful impulsivity; Intense/inappropriate anger   Other Mood/Personality Symptoms:  No data recorded   Mental Status Exam Appearance and self-care  Stature:   Average   Weight:    Overweight   Clothing:   -- (In scrubs)   Grooming:   Neglected   Cosmetic use:   None   Posture/gait:   Normal   Motor activity:   Not Remarkable   Sensorium  Attention:   Distractible   Concentration:  No data recorded  Orientation:   Object; Person; Place; Situation   Recall/memory:   Normal   Affect and Mood  Affect:   Anxious   Mood:   Anxious   Relating  Eye contact:   Normal   Facial expression:   Anxious   Attitude toward examiner:   Passive; Guarded   Thought and Language  Speech flow:  Articulation error   Thought content:   Delusions; Persecutions   Preoccupation:   Obsessions   Hallucinations:   None   Organization:  No data recorded  [employer] of Knowledge:   Impoverished by (Comment) (Stroke)   Intelligence:   Average   Abstraction:   Functional   Judgement:   Poor   Reality Testing:   Distorted   Insight:   None/zero insight   Decision Making:   Impulsive   Social Functioning  Social Maturity:   Impulsive   Social Judgement:   Heedless   Stress  Stressors:   Other (Comment) (Current mental status)   Coping Ability:   Overwhelmed   Skill Deficits:   Self-control; Interpersonal; Decision making; Communication   Supports:   Family; Support needed; Friends/Service system     Religion:    Leisure/Recreation:    Exercise/Diet:     CCA Employment/Education Employment/Work Situation:    Education:     CCA Family/Childhood History Family and Relationship History:    Childhood History:     Child/Adolescent Assessment:     CCA Substance Use Alcohol/Drug Use:                           ASAM's:  Six Dimensions of Multidimensional Assessment  Dimension 1:  Acute Intoxication and/or Withdrawal Potential:      Dimension 2:  Biomedical Conditions and Complications:      Dimension 3:  Emotional, Behavioral, or Cognitive Conditions and Complications:      Dimension 4:  Readiness to Change:     Dimension 5:  Relapse, Continued use, or Continued Problem Potential:     Dimension 6:  Recovery/Living Environment:     ASAM Severity Score:    ASAM Recommended Level of Treatment:     Substance use Disorder (SUD)    Recommendations for Services/Supports/Treatments:    DSM5 Diagnoses: Patient Active Problem List   Diagnosis Date Noted   Auditory hallucination 02/24/2021   Psychosis ([place]) 02/24/2021   Type 2 diabetes mellitus with hyperglycemia, without long-term current use of insulin ([place]) 03/09/2018   Recurrent major depressive disorder, in partial remission ([place]) 03/09/2018   H/O submandibular gland removal 12/15/2017   Elevated blood sugar level 07/08/2017   Ductal carcinoma in situ (DCIS) of left breast 01/18/2017   History of renal stone 01/15/2015   Ureteral stone 10/29/2014   Chest pain 08/21/2014   Malaise and fatigue 08/21/2014   Morbid obesity ([place]) 08/21/2014   H/O: CVA (cerebrovascular accident)  Hx of seizure disorder    History of echocardiogram    History of stress test    Heart murmur    Thyroid disease    Clinical depression 12/12/2013   Vitamin D deficiency 12/12/2013   Spastic hemiplegia affecting dominant side ([place]) 05/13/2011   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

## 2021-03-03 NOTE — Plan of Care (Signed)
  Problem: Education: Goal: Knowledge of [place] Education information/materials will improve Outcome: Progressing Goal: Emotional status will improve Outcome: Progressing Goal: Mental status will improve Outcome: Progressing   

## 2021-03-03 NOTE — ED Notes (Signed)
Report to [place], [place]

## 2021-03-03 NOTE — ED Notes (Signed)
On initial round after report Pt is awake, resting quietly in hallway bed without any s/s of distress.  Will continue to monitor throughout shift as ordered for any changes in behaviors and for continued safety.

## 2021-03-03 NOTE — ED Notes (Signed)
Breakfast tray given. °

## 2021-03-03 NOTE — ED Notes (Signed)
IVC moved to [facility] 8

## 2021-03-03 NOTE — BH Assessment (Signed)
Patient is to be admitted to [facility] BMU today 03/03/21 by Dr. Weber Cooks.  Attending Physician will be Dr.  Weber Cooks .   Patient has been assigned to room 303, by [place], Nicole Kindred.   Intake Paper Work has been signed and placed on patient chart.  ER staff is aware of the admission: Lisa,ER Secretary   Dr. Quentin Cornwall, ER MD  Radonna Ricker, Patient's Nurse  Seth Bake, Patient Access.

## 2021-03-03 NOTE — Progress Notes (Signed)
Patient at nurses station early shift talking with MHT. Asking if it was ok if her name tag was off of the door. States she tore it off and was told by someone upstairs that its not ok. Patient denies any SI, HI, AVH. Continues to wander throughout the unit, pushing on locked doors. Patient with expressive aphasia, difficulty understanding request.  Encouragement and support provided. Safety checks maintained. Medications given as prescribed. Pt receptive and remains safe on unit with q 15 min checks.

## 2021-03-03 NOTE — BH Assessment (Signed)
Patient is under review at [facility]. Awaiting accepting details.

## 2021-03-03 NOTE — ED Notes (Signed)
Naso swab completed, sent to lab

## 2021-03-03 NOTE — ED Notes (Signed)
Hospital meal provided.  100% consumed, pt tolerated w/o complaints.  Waste discarded appropriately.   

## 2021-03-03 NOTE — Tx Team (Signed)
Initial Treatment Plan 03/03/2021 5:54 PM Melissa Day [id number]    PATIENT STRESSORS: Medication change or noncompliance     PATIENT STRENGTHS: Motivation for treatment/growth    PATIENT IDENTIFIED PROBLEMS: Medication noncompliance                     DISCHARGE CRITERIA:  Motivation to continue treatment in a less acute level of care  PRELIMINARY DISCHARGE PLAN: Return to previous living arrangement  PATIENT/FAMILY INVOLVEMENT: This treatment plan has been presented to and reviewed with the patient, Melissa Day.  The patient and family have been given the opportunity to ask questions and make suggestions.  Donette Larry, RN 03/03/2021, 5:54 PM

## 2021-03-03 NOTE — Progress Notes (Signed)
Patient ID: Melissa Day, female   DOB: [birth year], 51 y.o.   MRN: [id number]  Admission note: Patient is a 51 year old female, presents IVC per report of organic psychosis. Patient is alert and oriented to unit.  Patient is cooperative during assessment questions. Patient presents with a superficial scratch on right forearm. Patient denies pain. Patient denies stressors related to admission at this time. Patient presents calm and cooperative. Patient has expressive aphasia, delayed responses, and right sided weakness due to previous stroke.  Patient currently denies SI/HI/AVH.  After assessment patient presents paranoid and displays delusions. Patient repeatedly states "don't let her come".  Legal guardian notified of patients admission on to the unit. Unit policies explained and verbalized understanding. Q15 minute checks maintained and will continue to monitor.

## 2021-03-03 NOTE — Consult Note (Signed)
[facility] Consult   Reason for Consult:Hallucinations Referring Physician: Dr. Corky Downs Patient Identification: Melissa Day MRN:  [id number] Principal Diagnosis: <principal problem not specified> Diagnosis:  Active Problems:   Hx of seizure disorder   Clinical depression   Type 2 diabetes mellitus with hyperglycemia, without long-term current use of insulin (HCC)   Recurrent major depressive disorder, in partial remission ([place])   Auditory hallucination   Psychosis ([place])   Total Time spent with patient: 1 hour  Subjective: "My neighbors upstairs keep talking all the time."  Melissa Day is a 51 y.o. female patient presented to [facility] ED via law enforcement under involuntary commitment status (IVC). The IVC papers state that the patient is psychotic and highly agitated. The patient said that she hears voices, and her apartment is completely bugged. The patient said she could listen to the people upstairs talking and telling her things. The patient is agitated while explaining what she is experiencing. The patient was admitted to the inpatient unit at [facility] on 02/24/21 and discharged on 11/26/21.  The patient was seen face-to-face by this provider; the chart was reviewed and consulted with Dr.Kinner on 03/03/2021 due to the patient's care. The patient is not functioning well, which concerns her mom about the patient's safety. It was discussed with the EDP that the patient does meet the criteria to be admitted to the psychiatric inpatient unit.  On evaluation, the patient is alert and oriented x 4, calm but emotional but cooperative, and mood-congruent with affect.  The patient does not appear to be responding to internal or external stimuli. The patient is presenting with some delusional thinking. The patient admits to auditory hallucinations. The patient denies any suicidal, homicidal, or self-harm ideations. The patient is presenting with some psychotic and paranoid  behaviors. During an encounter with the patient, she could answer questions appropriately. Collateral was obtained from the patient's mother (Ms. Starleen Blue [phone] cell), who expressed concerns about the patient's hallucinations and overall mental health.  Ms. Starleen Blue spoke to a Education officer, museum and said, "my daughter is not well to be discharged. Your cannot send her home like this. Ms. Starleen Blue stated that she asked a psychiatrist to call her to discuss her daughter's care, but no one did. The patient's mom is upset and voiced that the patient's outpatient psychiatric provider is on vacation, and she cannot reach anyone to assist her in caring for her daughter. The patient's mom voiced, "I have never seen my child in this state, and I am very concerned about her.    HPI: Per Dr. Corky Downs, Melissa Day is a 51 y.o. female with extensive past medical history as including history of seizure disorder, history of CVA, history of diabetes auditory hallucinations and psychosis as well as major depressive disorder who presents with reported hallucinations.  Patient brought in by [facility] Department, mother took out involuntary commitment papers and the patient because she is agitated and "psychotic ".  Patient does complain of hearing voices and that her apartment is bugged  Past Psychiatric History:   Risk to Self:   Risk to Others:   Prior Inpatient Therapy:   Prior Outpatient Therapy:    Past Medical History:  Past Medical History:  Diagnosis Date   Anemia    Cancer ([place]) 2018   br cancer on left with lump and rad tx    Diabetes mellitus without complication ([place])    Family history of colonic polyps    H/O: CVA (cerebrovascular accident) 2012  a. cerebral edema, craniotomy, residual right upper & lower limb weakness   Heart murmur    High cholesterol    History of colon polyps    History of echocardiogram    a. 12/01/2013: EF 50-55%, nl global LV systolic function, mild MR, mildly  increased LV posterior wall thickness   History of kidney stones    History of stress test    a. 12/01/2013: no significant ischemia, no significant WMA, no EKG changes concerning for ischemia, EF 67%, low risk scan   Hx of seizure disorder    a. one episode 10 months after CVA, since then on Keppra    Hypothyroidism    Personal history of radiation therapy 2019   LEFT lumpectomy   Seizures ([place])    Stroke ([place]) 2012   Thyroid disease     Past Surgical History:  Procedure Laterality Date   BRAIN SURGERY     decompression after stroke   BREAST BIOPSY Left 12/13/2016   path pending   BREAST LUMPECTOMY Left 01/06/2017   DUCTAL CARCINOMA IN SITU, NUCLEAR GRADE 3.   BREAST LUMPECTOMY WITH NEEDLE LOCALIZATION Left 01/06/2017   Procedure: BREAST LUMPECTOMY WITH NEEDLE LOCALIZATION;  Surgeon: Leonie Green, MD;  Location: [facility] ORS;  Service: General;  Laterality: Left;   COLONOSCOPY WITH PROPOFOL N/A 11/18/2014   Procedure: COLONOSCOPY WITH PROPOFOL;  Surgeon: Josefine Class, MD;  Location: [facility] ENDOSCOPY;  Service: Endoscopy;  Laterality: N/A;   COLONOSCOPY WITH PROPOFOL N/A 08/19/2020   Procedure: COLONOSCOPY WITH PROPOFOL;  Surgeon: Lesly Rubenstein, MD;  Location: [facility] ENDOSCOPY;  Service: Endoscopy;  Laterality: N/A;   CYSTOSCOPY/RETROGRADE/URETEROSCOPY/STONE EXTRACTION WITH BASKET Right 10/29/2014   Procedure: CYSTOSCOPY/URETEROSCOPY/STONE EXTRACTION WITH BASKET;  Surgeon: Franchot Gallo, MD;  Location: [facility] ORS;  Service: Urology;  Laterality: Right;   ESOPHAGOGASTRODUODENOSCOPY (EGD) WITH PROPOFOL N/A 11/18/2014   Procedure: ESOPHAGOGASTRODUODENOSCOPY (EGD) WITH PROPOFOL;  Surgeon: Josefine Class, MD;  Location: [facility] ENDOSCOPY;  Service: Endoscopy;  Laterality: N/A;   RE-EXCISION OF BREAST LUMPECTOMY Left 01/25/2017   Procedure: RE-EXCISION OF BREAST LUMPECTOMY;  Surgeon: Leonie Green, MD;  Location: [facility] ORS;  Service: General;  Laterality: Left;    SUBMANDIBULAR GLAND EXCISION Right 12/15/2017   Procedure: EXCISION SUBMANDIBULAR GLAND;  Surgeon: Carloyn Manner, MD;  Location: [facility] ORS;  Service: ENT;  Laterality: Right;   TONSILLECTOMY     Family History:  Family History  Problem Relation Age of Onset   Nephrolithiasis Mother    Non-Hodgkin's lymphoma Mother 33       currently 74   Heart disease Father    CVA Father    Nephrolithiasis Maternal Grandfather    Diabetes Maternal Grandfather    Pancreatic cancer Maternal Grandfather 12       deceased 4   Breast cancer Sister 86       negative genetic testing; currently 67   Lymphoma Maternal Grandmother        deceased 37   Breast cancer Paternal Aunt        4 more paternal aunts with breast cancer dx 60s-70s   Breast cancer Cousin        2 paternal cousins; daughters of pat aunt w/ BC at 59   Colon cancer Cousin 41       son of mat aunt   Breast cancer Paternal Aunt 19       deceased 43 of MI   Family Psychiatric  History:  Social History:  Social History   Substance and Sexual  Activity  Alcohol Use No     Social History   Substance and Sexual Activity  Drug Use No    Social History   Socioeconomic History   Marital status: Divorced    Spouse name: Not on file   Number of children: Not on file   Years of education: Not on file   Highest education level: Not on file  Occupational History   Not on file  Tobacco Use   Smoking status: Former    Packs/day: 1.00    Years: 15.00    Pack years: 15.00    Types: Cigarettes    Quit date: 12/30/2011    Years since quitting: 9.1   Smokeless tobacco: Never  Vaping Use   Vaping Use: Never used  Substance and Sexual Activity   Alcohol use: No   Drug use: No   Sexual activity: Never  Other Topics Concern   Not on file  Social History Narrative   Not on file   Social Determinants of Health   Financial Resource Strain: Not on file  Food Insecurity: Not on file  Transportation Needs: Not on file  Physical  Activity: Not on file  Stress: Not on file  Social Connections: Not on file   Additional Social History:    Allergies:  No Known Allergies  Labs:  Results for orders placed or performed during the hospital encounter of 03/02/21 (from the past 48 hour(s))  Comprehensive metabolic panel     Status: Abnormal   Collection Time: 03/02/21  6:01 PM  Result Value Ref Range   Sodium 136 135 - 145 mmol/L   Potassium 3.2 (L) 3.5 - 5.1 mmol/L   Chloride 103 98 - 111 mmol/L   CO2 21 (L) 22 - 32 mmol/L   Glucose, Bld 170 (H) 70 - 99 mg/dL    Comment: Glucose reference range applies only to samples taken after fasting for at least 8 hours.   BUN 6 6 - 20 mg/dL   Creatinine, Ser 0.53 0.44 - 1.00 mg/dL   Calcium 9.2 8.9 - 10.3 mg/dL   Total Protein 7.6 6.5 - 8.1 g/dL   Albumin 4.4 3.5 - 5.0 g/dL   AST 21 15 - 41 U/L   ALT 22 0 - 44 U/L   Alkaline Phosphatase 90 38 - 126 U/L   Total Bilirubin 0.7 0.3 - 1.2 mg/dL   GFR, Estimated >60 >60 mL/min    Comment: (NOTE) Calculated using the CKD-EPI Creatinine Equation (2021)    Anion gap 12 5 - 15    Comment: Performed at [facility], [address] [place], [place] [address]  Ethanol     Status: None   Collection Time: 03/02/21  6:01 PM  Result Value Ref Range   Alcohol, Ethyl (B) <10 <10 mg/dL    Comment: (NOTE) Lowest detectable limit for serum alcohol is 10 mg/dL.  For medical purposes only. Performed at [facility], [address] [place], [place] [address]   Salicylate level     Status: Abnormal   Collection Time: 03/02/21  6:01 PM  Result Value Ref Range   Salicylate Lvl <7.8 (L) 7.0 - 30.0 mg/dL    Comment: Performed at [facility], [address] [place], [place] [address]  Acetaminophen level     Status: Abnormal   Collection Time: 03/02/21  6:01 PM  Result Value Ref Range   Acetaminophen (Tylenol), Serum <10 (L) 10 - 30 ug/mL    Comment: (NOTE) Therapeutic  concentrations vary significantly.  A range of 10-30 ug/mL  may be an effective concentration for many patients. However, some  are best treated at concentrations outside of this range. Acetaminophen concentrations >150 ug/mL at 4 hours after ingestion  and >50 ug/mL at 12 hours after ingestion are often associated with  toxic reactions.  Performed at [facility], [address] [place], [place] [address]   cbc     Status: Abnormal   Collection Time: 03/02/21  6:01 PM  Result Value Ref Range   WBC 13.5 (H) 4.0 - 10.5 K/uL   RBC 4.60 3.87 - 5.11 MIL/uL   Hemoglobin 13.4 12.0 - 15.0 g/dL   HCT 39.2 36.0 - 46.0 %   MCV 85.2 80.0 - 100.0 fL   MCH 29.1 26.0 - 34.0 pg   MCHC 34.2 30.0 - 36.0 g/dL   RDW 13.2 11.5 - 15.5 %   Platelets 346 150 - 400 K/uL   nRBC 0.0 0.0 - 0.2 %    Comment: Performed at [facility], [address] [place], [place] [address]  Urine Drug Screen, Qualitative     Status: None   Collection Time: 03/02/21  6:01 PM  Result Value Ref Range   Tricyclic, Ur Screen NONE DETECTED NONE DETECTED   Amphetamines, Ur Screen NONE DETECTED NONE DETECTED   MDMA (Ecstasy)Ur Screen NONE DETECTED NONE DETECTED   Cocaine Metabolite,Ur [place] NONE DETECTED NONE DETECTED   Opiate, Ur Screen NONE DETECTED NONE DETECTED   Phencyclidine (PCP) Ur S NONE DETECTED NONE DETECTED   Cannabinoid 50 Ng, Ur [place] NONE DETECTED NONE DETECTED   Barbiturates, Ur Screen NONE DETECTED NONE DETECTED   Benzodiazepine, Ur Scrn NONE DETECTED NONE DETECTED   Methadone Scn, Ur NONE DETECTED NONE DETECTED    Comment: (NOTE) Tricyclics + metabolites, urine    Cutoff 1000 ng/mL Amphetamines + metabolites, urine  Cutoff 1000 ng/mL MDMA (Ecstasy), urine              Cutoff 500 ng/mL Cocaine Metabolite, urine          Cutoff 300 ng/mL Opiate + metabolites, urine        Cutoff 300 ng/mL Phencyclidine (PCP), urine         Cutoff 25 ng/mL Cannabinoid, urine                 Cutoff 50 ng/mL Barbiturates + metabolites, urine   Cutoff 200 ng/mL Benzodiazepine, urine              Cutoff 200 ng/mL Methadone, urine                   Cutoff 300 ng/mL  The urine drug screen provides only a preliminary, unconfirmed analytical test result and should not be used for non-medical purposes. Clinical consideration and professional judgment should be applied to any positive drug screen result due to possible interfering substances. A more specific alternate chemical method must be used in order to obtain a confirmed analytical result. Gas chromatography / mass spectrometry (GC/MS) is the preferred confirm atory method. Performed at [facility], [address] [place], [place] [address]     Current Facility-Administered Medications  Medication Dose Route Frequency Provider Last Rate Last Admin   aspirin EC tablet 81 mg  81 mg Oral Daily Lavonia Drafts, MD       atorvastatin (LIPITOR) tablet 40 mg  40 mg Oral Daily Lavonia Drafts, MD       baclofen (LIORESAL)  tablet 5 mg  5 mg Oral BID Lavonia Drafts, MD   5 mg at 03/03/21 0003   FLUoxetine (PROZAC) capsule 40 mg  40 mg Oral Daily Lavonia Drafts, MD       levETIRAcetam (KEPPRA) tablet 500 mg  500 mg Oral BID Lavonia Drafts, MD   500 mg at 03/03/21 0003   levothyroxine (SYNTHROID) tablet 100 mcg  100 mcg Oral QAC breakfast Lavonia Drafts, MD       losartan (COZAAR) tablet 50 mg  50 mg Oral Daily Lavonia Drafts, MD       metFORMIN (GLUCOPHAGE) tablet 500 mg  500 mg Oral Q breakfast Lavonia Drafts, MD       risperiDONE (RISPERDAL) tablet 0.5 mg  0.5 mg Oral QHS Lavonia Drafts, MD   0.5 mg at 03/03/21 0002   Current Outpatient Medications  Medication Sig Dispense Refill   aspirin EC 81 MG tablet Take 81 mg by mouth daily.     atorvastatin (LIPITOR) 40 MG tablet Take 1 tablet (40 mg total) by mouth daily. 30 tablet 3   baclofen (LIORESAL) 10 MG tablet Take 5 mg by mouth 2 (two) times daily.     FLUoxetine (PROZAC) 40 MG capsule Take 40 mg by mouth daily.      levETIRAcetam (KEPPRA) 500 MG tablet Take 1 tablet (500 mg total) by mouth 2 (two) times daily. 60 tablet 1   levothyroxine (SYNTHROID) 100 MCG tablet Take 1 tablet (100 mcg total) by mouth daily before breakfast. 30 tablet 1   losartan (COZAAR) 50 MG tablet Take 50 mg by mouth daily.     metFORMIN (GLUCOPHAGE) 500 MG tablet Take 500 mg by mouth daily with breakfast.     risperiDONE (RISPERDAL) 0.5 MG tablet Take 1 tablet (0.5 mg total) by mouth at bedtime. 30 tablet 1    Musculoskeletal: Strength & Muscle Tone: within normal limits Gait & Station: normal Patient leans: N/A  Psychiatric Specialty Exam:  Presentation  General Appearance: Bizarre  Eye Contact:Good  Speech:Other (comment)  Speech Volume:Decreased  Handedness:Right   Mood and Affect  Mood:Anxious; Depressed  Affect:Flat; Inappropriate; Depressed; Blunt   Thought Process  Thought Processes:Disorganized  Descriptions of Associations:Loose  Orientation:Partial  Thought Content:Illogical; Rumination  History of Schizophrenia/Schizoaffective disorder:No  Duration of Psychotic Symptoms:Greater than six months  Hallucinations:Hallucinations: Auditory Description of Auditory Hallucinations: "Neighbors upstairs saying random words."  Ideas of Reference:Paranoia  Suicidal Thoughts:Suicidal Thoughts: No  Homicidal Thoughts:Homicidal Thoughts: No   Sensorium  Memory:Immediate Fair; Recent Fair; Remote Fair  Judgment:Poor  Insight:Poor   Executive Functions  Concentration:Fair  Attention Span:Fair  [place]   Psychomotor Activity  Psychomotor Activity:Psychomotor Activity: Normal   Assets  Assets:Communication Skills; Desire for Improvement; Social Support; Physical Health; Housing   Sleep  Sleep:Sleep: Poor Number of Hours of Sleep: 5   Physical Exam: Physical Exam Vitals and nursing note reviewed.  Constitutional:      Appearance:  Normal appearance. She is normal weight.  HENT:     Head: Normocephalic and atraumatic.     Right Ear: External ear normal.     Left Ear: External ear normal.     Nose: Nose normal.     Mouth/Throat:     Mouth: Mucous membranes are dry.  Cardiovascular:     Rate and Rhythm: Tachycardia present.  Pulmonary:     Effort: Pulmonary effort is normal.  Musculoskeletal:        General: Normal range of motion.  Cervical back: Normal range of motion and neck supple.  Neurological:     Mental Status: She is alert and oriented to person, place, and time.  Psychiatric:        Attention and Perception: She perceives auditory hallucinations.        Mood and Affect: Mood is anxious and depressed.        Speech: Speech is delayed and slurred.        Behavior: Behavior is withdrawn. Behavior is cooperative.        Thought Content: Thought content is paranoid and delusional.        Cognition and Memory: Cognition is impaired.        Judgment: Judgment is inappropriate.   Review of Systems  Psychiatric/Behavioral:  Positive for depression and hallucinations. The patient is nervous/anxious and has insomnia.   All other systems reviewed and are negative. Blood pressure (!) 149/103, pulse (!) 125, temperature 98 F (36.7 C), temperature source Oral, resp. rate 20, height 5\' 4"  (1.626 m), weight 91.2 kg, SpO2 100 %. Body mass index is 34.5 kg/m.  Treatment Plan Summary: Plan Patient does meet criteria for psychiatric inpatient admission  Disposition: Recommend psychiatric Inpatient admission when medically cleared. Supportive therapy provided about ongoing stressors.  Caroline Sauger, NP 03/03/2021 2:23 AM

## 2021-03-04 DIAGNOSIS — F09 Unspecified mental disorder due to known physiological condition: Principal | ICD-10-CM

## 2021-03-04 MED ORDER — RISPERIDONE 1 MG PO TABS
2.0000 mg | ORAL_TABLET | Freq: Every day | ORAL | Status: DC
  Administered 2021-03-04 – 2021-03-09 (×6): 2 mg via ORAL
  Filled 2021-03-04 (×6): qty 2

## 2021-03-04 NOTE — [facility] Suicide Risk Assessment (Signed)
[facility] Admission Suicide Risk Assessment   Nursing information obtained from:  Patient Demographic factors:  Caucasian, Unemployed Current Mental Status:  NA Loss Factors:  Decline in physical health Historical Factors:  NA Risk Reduction Factors:  Positive social support, Positive therapeutic relationship  Total Time spent with patient: 1 hour Principal Problem: Organic psychosis Diagnosis:  Principal Problem:   Organic psychosis Active Problems:   H/O: CVA (cerebrovascular accident)   Hx of seizure disorder  Subjective Data: Patient seen and chart reviewed.  51 year old woman brought to the emergency room in the company of her mother with reports that she continues to have paranoid ideation about the people who live upstairs from her.  Reportedly she has recently been going up confronting them which has escalated the situation.  Patient denies having any suicidal thought whatsoever or ever having any thought or intention of hurting herself or anyone else.  Continued Clinical Symptoms:  Alcohol Use Disorder Identification Test Final Score (AUDIT): 0 The "Alcohol Use Disorders Identification Test", Guidelines for Use in Primary Care, Second Edition.  World Pharmacologist [facility]). Score between 0-7:  no or low risk or alcohol related problems. Score between 8-15:  moderate risk of alcohol related problems. Score between 16-19:  high risk of alcohol related problems. Score 20 or above:  warrants further diagnostic evaluation for alcohol dependence and treatment.   CLINICAL FACTORS:   Severe Anxiety and/or Agitation Currently Psychotic Medical Diagnoses and Treatments/Surgeries   Musculoskeletal: Strength & Muscle Tone: within normal limits Gait & Station: broad based Patient leans: N/A  Psychiatric Specialty Exam:  Presentation  General Appearance: Bizarre  Eye Contact:Good  Speech:Other (comment)  Speech Volume:Decreased  Handedness:Right   Mood and Affect   Mood:Anxious; Depressed  Affect:Flat; Inappropriate; Depressed; Blunt   Thought Process  Thought Processes:Disorganized  Descriptions of Associations:Loose  Orientation:Partial  Thought Content:Illogical; Rumination  History of Schizophrenia/Schizoaffective disorder:No  Duration of Psychotic Symptoms:Greater than six months  Hallucinations:No data recorded Ideas of Reference:Paranoia  Suicidal Thoughts:No data recorded Homicidal Thoughts:No data recorded  Sensorium  Memory:Immediate Fair; Recent Fair; Remote Fair  Judgment:Poor  Insight:Poor   Executive Functions  Concentration:Fair  Attention Span:Fair  [place]   Psychomotor Activity  Psychomotor Activity:No data recorded  Assets  Assets:Communication Skills; Desire for Improvement; Social Support; Physical Health; Housing   Sleep  Sleep:No data recorded   Physical Exam: Physical Exam Vitals and nursing note reviewed.  Constitutional:      Appearance: Normal appearance.  HENT:     Head: Normocephalic and atraumatic.     Mouth/Throat:     Pharynx: Oropharynx is clear.  Eyes:     Pupils: Pupils are equal, round, and reactive to light.  Cardiovascular:     Rate and Rhythm: Normal rate and regular rhythm.  Pulmonary:     Effort: Pulmonary effort is normal.     Breath sounds: Normal breath sounds.  Abdominal:     General: Abdomen is flat.     Palpations: Abdomen is soft.  Musculoskeletal:        General: Normal range of motion.  Skin:    General: Skin is warm and dry.  Neurological:     General: No focal deficit present.     Mental Status: She is alert. Mental status is at baseline.     Motor: Weakness present.     Comments: Right sided weakness and expressive aphasia  Psychiatric:        Attention and Perception: Attention normal.  Mood and Affect: Mood normal.        Speech: She is noncommunicative.        Behavior: Behavior is  cooperative.        Thought Content: Thought content is paranoid.        Cognition and Memory: Cognition is impaired.        Judgment: Judgment is inappropriate.   Review of Systems  Constitutional: Negative.   HENT: Negative.    Eyes: Negative.   Respiratory: Negative.    Cardiovascular: Negative.   Gastrointestinal: Negative.   Musculoskeletal: Negative.   Skin: Negative.   Neurological: Negative.   Psychiatric/Behavioral:  Positive for hallucinations. Negative for depression, memory loss, substance abuse and suicidal ideas. The patient is nervous/anxious and has insomnia.   Blood pressure 128/89, pulse (!) 118, temperature 98.2 F (36.8 C), temperature source Oral, resp. rate 18, height 5\' 4"  (1.626 m), weight 88 kg, SpO2 97 %. Body mass index is 33.3 kg/m.   COGNITIVE FEATURES THAT CONTRIBUTE TO RISK:  Loss of executive function    SUICIDE RISK:   Minimal: No identifiable suicidal ideation.  Patients presenting with no risk factors but with morbid ruminations; may be classified as minimal risk based on the severity of the depressive symptoms  PLAN OF CARE: Patient will continue on 15-minute checks.  Discussed with patient diagnosis and treatment plan.  Antipsychotic medication for what appears to be paranoid most likely related to organic condition.  Patient agreeable.  Ongoing reassessment of dangerousness prior to discharge planning  I certify that inpatient services furnished can reasonably be expected to improve the patient's condition.   Alethia Berthold, MD 03/04/2021, 2:44 PM

## 2021-03-04 NOTE — H&P (Signed)
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  For the past several months it is reported that she has been experiencing hallucinations and paranoid believes accompanying them regarding people who live in the apartment above her.  Patient's family have been aware of this for a few months and have taken her to see various doctors and emergency room settings.  Patient was just recently in our hospital a week ago and was discharged but mother brings her back noting that the patient continues to have the symptoms.  On interview the patient, who has a chronic expressive aphasia, is difficult to understand but is able to answer yes or no to questions and gives some information that I can understand.  She indicates a belief that the people who live in the apartment above her are talking at night about her and specifically about her "Medicare number".  Patient denies feeling depressed.  Denies suicidal or homicidal thought.  Denies any thoughts or intent of harming these people but admits that she has gone upstairs to knock on the door to try and talk to them about the situation.  Patient was treated with low-dose Risperdal during her hospitalization here but evidently has continued to have the symptoms.  There is no known recent change in her medical status.  Medications have been stable.  No substance abuse. Associated Signs/Symptoms: Depression Symptoms:  anxiety, Duration of Depression Symptoms: No data recorded (Hypo) Manic Symptoms:   None specific to mania Anxiety Symptoms:  Excessive Worry, Psychotic  Symptoms:  Hallucinations: Auditory Paranoia, PTSD Symptoms: Negative Total Time spent with patient: 1 hour  Past Psychiatric History: Patient has no previous psychiatric history apart from this set of symptoms.  She had a stroke several years ago and has a chronic expressive aphasia and chronic right-sided weakness.  No history of psychiatric hospitalization prior to the 1 here a week ago.  No past history of depression and suicidal ideation or violence.  Primary doctor had started her on fluoxetine apparently in response to these symptoms.  Is the patient at risk to self? No.  Has the patient been a risk to self in the past 6 months? No.  Has the patient been a risk to self within the distant past? No.  Is the patient a risk to others? No.  Has the patient been a risk to others in the past 6 months? No.  Has the patient been a risk to others within the distant past? No.   Prior Inpatient Therapy:   Prior Outpatient Therapy:    Alcohol Screening: 1. How often do you have a drink containing alcohol?: Never 2. How many drinks containing alcohol do you have on a typical day when you are drinking?: 1 or 2 3. How often do you have six or more drinks on one occasion?: Never AUDIT-C Score: 0 4. How often during the last year have you found that you were not able to stop drinking once you had started?: Never 5. How often during the last year have you failed to do what was normally expected from you because of drinking?: Never 6. How often during the last  year have you needed a first drink in the morning to get yourself going after a heavy drinking session?: Never 7. How often during the last year have you had a feeling of guilt of remorse after drinking?: Never 8. How often during the last year have you been unable to remember what happened the night before because you had been drinking?: Never 9. Have you or someone else been injured as a result of your drinking?: No 10. Has a relative or  friend or a doctor or another health worker been concerned about your drinking or suggested you cut down?: No Alcohol Use Disorder Identification Test Final Score (AUDIT): 0 Substance Abuse History in the last 12 months:  No. Consequences of Substance Abuse: Negative Previous Psychotropic Medications: Yes  Psychological Evaluations: Yes  Past Medical History:  Past Medical History:  Diagnosis Date   Anemia    Cancer ([place]) 2018   br cancer on left with lump and rad tx    Diabetes mellitus without complication ([place])    Family history of colonic polyps    H/O: CVA (cerebrovascular accident) 2012   a. cerebral edema, craniotomy, residual right upper & lower limb weakness   Heart murmur    High cholesterol    History of colon polyps    History of echocardiogram    a. 12/01/2013: EF 50-55%, nl global LV systolic function, mild MR, mildly increased LV posterior wall thickness   History of kidney stones    History of stress test    a. 12/01/2013: no significant ischemia, no significant WMA, no EKG changes concerning for ischemia, EF 67%, low risk scan   Hx of seizure disorder    a. one episode 10 months after CVA, since then on Keppra    Hypothyroidism    Personal history of radiation therapy 2019   LEFT lumpectomy   Seizures ([place])    Stroke ([place]) 2012   Thyroid disease     Past Surgical History:  Procedure Laterality Date   BRAIN SURGERY     decompression after stroke   BREAST BIOPSY Left 12/13/2016   path pending   BREAST LUMPECTOMY Left 01/06/2017   DUCTAL CARCINOMA IN SITU, NUCLEAR GRADE 3.   BREAST LUMPECTOMY WITH NEEDLE LOCALIZATION Left 01/06/2017   Procedure: BREAST LUMPECTOMY WITH NEEDLE LOCALIZATION;  Surgeon: Leonie Green, MD;  Location: [facility] ORS;  Service: General;  Laterality: Left;   COLONOSCOPY WITH PROPOFOL N/A 11/18/2014   Procedure: COLONOSCOPY WITH PROPOFOL;  Surgeon: Josefine Class, MD;  Location[facility] ENDOSCOPY;  Service: Endoscopy;  Laterality:  N/A;   COLONOSCOPY WITH PROPOFOL N/A 08/19/2020   Procedure: COLONOSCOPY WITH PROPOFOL;  Surgeon: Lesly Rubenstein, MD;  Location: [facility] ENDOSCOPY;  Service: Endoscopy;  Laterality: N/A;   CYSTOSCOPY/RETROGRADE/URETEROSCOPY/STONE EXTRACTION WITH BASKET Right 10/29/2014   Procedure: CYSTOSCOPY/URETEROSCOPY/STONE EXTRACTION WITH BASKET;  Surgeon: Franchot Gallo, MD;  Location: [facility] ORS;  Service: Urology;  Laterality: Right;   ESOPHAGOGASTRODUODENOSCOPY (EGD) WITH PROPOFOL N/A 11/18/2014   Procedure: ESOPHAGOGASTRODUODENOSCOPY (EGD) WITH PROPOFOL;  Surgeon: Josefine Class, MD;  [facility] ENDOSCOPY;  Service: Endoscopy;  Laterality: N/A;   RE-EXCISION OF BREAST LUMPECTOMY Left 01/25/2017   Procedure: RE-EXCISION OF BREAST LUMPECTOMY;  Surgeon: Leonie Green, MD;  Location: [facility] ORS;  Service: General;  Laterality: Left;   SUBMANDIBULAR GLAND EXCISION Right 12/15/2017   Procedure: EXCISION SUBMANDIBULAR GLAND;  Surgeon: Carloyn Manner, MD;  Location: [facility] ORS;  Service: ENT;  Laterality: Right;   TONSILLECTOMY     Family History:  Family History  Problem Relation Age of Onset   Nephrolithiasis Mother    Non-Hodgkin's lymphoma Mother 43       currently 57   Heart disease Father    CVA Father    Nephrolithiasis Maternal Grandfather    Diabetes Maternal Grandfather    Pancreatic cancer Maternal Grandfather 15       deceased 44   Breast cancer Sister 65       negative genetic testing; currently 35   Lymphoma Maternal Grandmother        deceased 5   Breast cancer Paternal Aunt        35 more paternal aunts with breast cancer dx 100s-70s   Breast cancer Cousin        2 paternal cousins; daughters of pat aunt w/ BC at 70   Colon cancer Cousin 53       son of mat aunt   Breast cancer Paternal Aunt 61       deceased 23 of MI   Family Psychiatric  History: None reported Tobacco Screening:   Social History:  Social History   Substance and Sexual Activity  Alcohol Use  No     Social History   Substance and Sexual Activity  Drug Use No    Additional Social History:                           Allergies:  No Known Allergies Lab Results:  Results for orders placed or performed during the hospital encounter of 03/02/21 (from the past 48 hour(s))  Comprehensive metabolic panel     Status: Abnormal   Collection Time: 03/02/21  6:01 PM  Result Value Ref Range   Sodium 136 135 - 145 mmol/L   Potassium 3.2 (L) 3.5 - 5.1 mmol/L   Chloride 103 98 - 111 mmol/L   CO2 21 (L) 22 - 32 mmol/L   Glucose, Bld 170 (H) 70 - 99 mg/dL    Comment: Glucose reference range applies only to samples taken after fasting for at least 8 hours.   BUN 6 6 - 20 mg/dL   Creatinine, Ser 0.53 0.44 - 1.00 mg/dL   Calcium 9.2 8.9 - 10.3 mg/dL   Total Protein 7.6 6.5 - 8.1 g/dL   Albumin 4.4 3.5 - 5.0 g/dL   AST 21 15 - 41 U/L   ALT 22 0 - 44 U/L   Alkaline Phosphatase 90 38 - 126 U/L   Total Bilirubin 0.7 0.3 - 1.2 mg/dL   GFR, Estimated >60 >60 mL/min    Comment: (NOTE) Calculated using the CKD-EPI Creatinine Equation (2021)    Anion gap 12 5 - 15    Comment: Performed at [facility], [address] [place], [place] [address]  Ethanol     Status: None   Collection Time: 03/02/21  6:01 PM  Result Value Ref Range   Alcohol, Ethyl (B) <10 <10 mg/dL    Comment: (NOTE) Lowest detectable limit for serum alcohol is 10 mg/dL.  For medical purposes only. Performed at [facility], [address] [place], [place] [address]   Salicylate level     Status: Abnormal   Collection Time: 03/02/21  6:01 PM  Result Value Ref Range   Salicylate Lvl <0.9 (L) 7.0 - 30.0 mg/dL    Comment: Performed at [facility], [address], [place], [place] [address]  Acetaminophen level     Status: Abnormal  Collection Time: 03/02/21  6:01 PM  Result Value Ref Range   Acetaminophen (Tylenol), Serum <10 (L) 10 - 30 ug/mL    Comment: (NOTE) Therapeutic  concentrations vary significantly. A range of 10-30 ug/mL  may be an effective concentration for many patients. However, some  are best treated at concentrations outside of this range. Acetaminophen concentrations >150 ug/mL at 4 hours after ingestion  and >50 ug/mL at 12 hours after ingestion are often associated with  toxic reactions.  Performed at [facility], [address] [place], [place] [address]   cbc     Status: Abnormal   Collection Time: 03/02/21  6:01 PM  Result Value Ref Range   WBC 13.5 (H) 4.0 - 10.5 K/uL   RBC 4.60 3.87 - 5.11 MIL/uL   Hemoglobin 13.4 12.0 - 15.0 g/dL   HCT 39.2 36.0 - 46.0 %   MCV 85.2 80.0 - 100.0 fL   MCH 29.1 26.0 - 34.0 pg   MCHC 34.2 30.0 - 36.0 g/dL   RDW 13.2 11.5 - 15.5 %   Platelets 346 150 - 400 K/uL   nRBC 0.0 0.0 - 0.2 %    Comment: Performed at [facility], [address] [place], [place] [address]  Urine Drug Screen, Qualitative     Status: None   Collection Time: 03/02/21  6:01 PM  Result Value Ref Range   Tricyclic, Ur Screen NONE DETECTED NONE DETECTED   Amphetamines, Ur Screen NONE DETECTED NONE DETECTED   MDMA (Ecstasy)Ur Screen NONE DETECTED NONE DETECTED   Cocaine Metabolite,Ur [place] NONE DETECTED NONE DETECTED   Opiate, Ur Screen NONE DETECTED NONE DETECTED   Phencyclidine (PCP) Ur S NONE DETECTED NONE DETECTED   Cannabinoid 50 Ng, Ur [place] NONE DETECTED NONE DETECTED   Barbiturates, Ur Screen NONE DETECTED NONE DETECTED   Benzodiazepine, Ur Scrn NONE DETECTED NONE DETECTED   Methadone Scn, Ur NONE DETECTED NONE DETECTED    Comment: (NOTE) Tricyclics + metabolites, urine    Cutoff 1000 ng/mL Amphetamines + metabolites, urine  Cutoff 1000 ng/mL MDMA (Ecstasy), urine              Cutoff 500 ng/mL Cocaine Metabolite, urine          Cutoff 300 ng/mL Opiate + metabolites, urine        Cutoff 300 ng/mL Phencyclidine (PCP), urine         Cutoff 25 ng/mL Cannabinoid, urine                 Cutoff 50  ng/mL Barbiturates + metabolites, urine  Cutoff 200 ng/mL Benzodiazepine, urine              Cutoff 200 ng/mL Methadone, urine                   Cutoff 300 ng/mL  The urine drug screen provides only a preliminary, unconfirmed analytical test result and should not be used for non-medical purposes. Clinical consideration and professional judgment should be applied to any positive drug screen result due to possible interfering substances. A more specific alternate chemical method must be used in order to obtain a confirmed analytical result. Gas chromatography / mass spectrometry (GC/MS) is the preferred confirm atory method. Performed at [facility], [address] [place], [place] [address]   Pregnancy, urine     Status: None   Collection Time: 03/02/21  6:01 PM  Result Value Ref Range   Preg Test, Ur NEGATIVE NEGATIVE  Comment: Performed at [facility], [address] [place], [place] [address]  Resp Panel by RT-PCR (Flu A&B, Covid) Nasopharyngeal Swab     Status: None   Collection Time: 03/03/21 10:22 AM   Specimen: Nasopharyngeal Swab; Nasopharyngeal(NP) swabs in vial transport medium  Result Value Ref Range   SARS Coronavirus 2 by RT PCR NEGATIVE NEGATIVE    Comment: (NOTE) SARS-CoV-2 target nucleic acids are NOT DETECTED.  The SARS-CoV-2 RNA is generally detectable in upper respiratory specimens during the acute phase of infection. The lowest concentration of SARS-CoV-2 viral copies this assay can detect is 138 copies/mL. A negative result does not preclude SARS-Cov-2 infection and should not be used as the sole basis for treatment or other patient management decisions. A negative result may occur with  improper specimen collection/handling, submission of specimen other than nasopharyngeal swab, presence of viral mutation(s) within the areas targeted by this assay, and inadequate number of viral copies(<138 copies/mL). A negative result must be  combined with clinical observations, patient history, and epidemiological information. The expected result is Negative.  Fact Sheet for Patients:  EntrepreneurPulse.com.au  Fact Sheet for Healthcare Providers:  IncredibleEmployment.be  This test is no t yet approved or cleared by the [place] FDA and  has been authorized for detection and/or diagnosis of SARS-CoV-2 by FDA under an Emergency Use Authorization (EUA). This EUA will remain  in effect (meaning this test can be used) for the duration of the COVID-19 declaration under Section 564(b)(1) of the Act, 21 U.S.C.section 360bbb-3(b)(1), unless the authorization is terminated  or revoked sooner.       Influenza A by PCR NEGATIVE NEGATIVE   Influenza B by PCR NEGATIVE NEGATIVE    Comment: (NOTE) The Xpert Xpress SARS-CoV-2/FLU/RSV plus assay is intended as an aid in the diagnosis of influenza from Nasopharyngeal swab specimens and should not be used as a sole basis for treatment. Nasal washings and aspirates are unacceptable for Xpert Xpress SARS-CoV-2/FLU/RSV testing.  Fact Sheet for Patients: EntrepreneurPulse.com.au  Fact Sheet for Healthcare Providers: IncredibleEmployment.be  This test is not yet approved or cleared by the [place] FDA and has been authorized for detection and/or diagnosis of SARS-CoV-2 by FDA under an Emergency Use Authorization (EUA). This EUA will remain in effect (meaning this test can be used) for the duration of the COVID-19 declaration under Section 564(b)(1) of the Act, 21 U.S.C. section 360bbb-3(b)(1), unless the authorization is terminated or revoked.  Performed at [facility], [address] [place], [place] [address]     Blood Alcohol level:  Lab Results  Component Value Date   [facility] <10 03/02/2021   ETH <10 93/90/3009    Metabolic Disorder Labs:  Lab Results  Component Value Date    HGBA1C 6.8 (H) 02/25/2021   MPG 148.46 02/25/2021   No results found for: PROLACTIN Lab Results  Component Value Date   CHOL 120 02/25/2021   TRIG 76 02/25/2021   HDL 43 02/25/2021   CHOLHDL 2.8 02/25/2021   VLDL 15 02/25/2021   LDLCALC 62 02/25/2021   LDLCALC 94 12/01/2013    Current Medications: Current Facility-Administered Medications  Medication Dose Route Frequency Provider Last Rate Last Admin   acetaminophen (TYLENOL) tablet 650 mg  650 mg Oral Q6H PRN Raef Sprigg, Madie Reno, MD       alum & mag hydroxide-simeth (MAALOX/MYLANTA) 200-200-20 MG/5ML suspension 30 mL  30 mL Oral Q4H PRN Chalisa Kobler, Madie Reno, MD       aspirin EC tablet 81  mg  81 mg Oral Daily Jurgen Groeneveld, Madie Reno, MD   81 mg at 03/04/21 0817   atorvastatin (LIPITOR) tablet 40 mg  40 mg Oral Daily Brittiany Wiehe, Madie Reno, MD   40 mg at 03/03/21 2020   levETIRAcetam (KEPPRA) tablet 500 mg  500 mg Oral BID Meelah Tallo, Madie Reno, MD   500 mg at 03/04/21 0816   levothyroxine (SYNTHROID) tablet 100 mcg  100 mcg Oral Q0600 Kingsley Farace T, MD   100 mcg at 03/04/21 0645   losartan (COZAAR) tablet 50 mg  50 mg Oral Daily Delmas Faucett T, MD   50 mg at 03/04/21 0815   magnesium hydroxide (MILK OF MAGNESIA) suspension 30 mL  30 mL Oral Daily PRN Gerald Kuehl T, MD       metFORMIN (GLUCOPHAGE) tablet 500 mg  500 mg Oral BID WC Zamani Crocker T, MD   500 mg at 03/04/21 0817   risperiDONE (RISPERDAL) tablet 2 mg  2 mg Oral QHS Jeree Delcid T, MD       PTA Medications: Medications Prior to Admission  Medication Sig Dispense Refill Last Dose   aspirin EC 81 MG tablet Take 81 mg by mouth daily.      atorvastatin (LIPITOR) 40 MG tablet Take 1 tablet (40 mg total) by mouth daily. 30 tablet 3    baclofen (LIORESAL) 10 MG tablet Take 5 mg by mouth 2 (two) times daily.      FLUoxetine (PROZAC) 40 MG capsule Take 40 mg by mouth daily.      levETIRAcetam (KEPPRA) 500 MG tablet Take 1 tablet (500 mg total) by mouth 2 (two) times daily. 60 tablet 1    levothyroxine  (SYNTHROID) 100 MCG tablet Take 1 tablet (100 mcg total) by mouth daily before breakfast. 30 tablet 1    losartan (COZAAR) 50 MG tablet Take 50 mg by mouth daily.      metFORMIN (GLUCOPHAGE) 500 MG tablet Take 500 mg by mouth daily with breakfast.      risperiDONE (RISPERDAL) 0.5 MG tablet Take 1 tablet (0.5 mg total) by mouth at bedtime. 30 tablet 1     Musculoskeletal: Strength & Muscle Tone:  Right-sided weakness.  Very limited use of right arm.  Able to walk but with a limp favoring the right side. Gait & Station: broad based Patient leans: N/A            Psychiatric Specialty Exam:  Presentation  General Appearance: Bizarre  Eye Contact:Good  Speech:Other (comment)  Speech Volume:Decreased  Handedness:Right   Mood and Affect  Mood:Anxious; Depressed  Affect:Flat; Inappropriate; Depressed; Blunt   Thought Process  Thought Processes:Disorganized  Duration of Psychotic Symptoms: Greater than six months  Past Diagnosis of Schizophrenia or Psychoactive disorder: No  Descriptions of Associations:Loose  Orientation:Partial  Thought Content:Illogical; Rumination  Hallucinations:No data recorded Ideas of Reference:Paranoia  Suicidal Thoughts:No data recorded Homicidal Thoughts:No data recorded  Sensorium  Memory:Immediate Fair; Recent Fair; Remote Fair  Judgment:Poor  Insight:Poor   Executive Functions  Concentration:Fair  Attention Span:Fair  [place]   Psychomotor Activity  Psychomotor Activity:No data recorded  Assets  Assets:Communication Skills; Desire for Improvement; Social Support; Physical Health; Housing   Sleep  Sleep:No data recorded   Physical Exam: Physical Exam Vitals and nursing note reviewed.  Constitutional:      Appearance: Normal appearance.  HENT:     Head: Normocephalic and atraumatic.     Mouth/Throat:     Pharynx: Oropharynx is clear.  Eyes:     Pupils:  Pupils are equal, round, and reactive to light.  Cardiovascular:     Rate and Rhythm: Normal rate and regular rhythm.  Pulmonary:     Effort: Pulmonary effort is normal.     Breath sounds: Normal breath sounds.  Abdominal:     General: Abdomen is flat.     Palpations: Abdomen is soft.  Musculoskeletal:        General: Normal range of motion.  Skin:    General: Skin is warm and dry.  Neurological:     General: No focal deficit present.     Mental Status: She is alert. Mental status is at baseline.  Psychiatric:        Attention and Perception: Attention normal.        Mood and Affect: Mood is anxious.        Speech: She is noncommunicative.        Thought Content: Thought content is paranoid. Thought content does not include homicidal or suicidal ideation.        Cognition and Memory: Cognition is impaired.   Review of Systems  Constitutional: Negative.   HENT: Negative.    Eyes: Negative.   Respiratory: Negative.    Cardiovascular: Negative.   Gastrointestinal: Negative.   Musculoskeletal: Negative.   Skin: Negative.   Neurological:  Positive for focal weakness and weakness.  Psychiatric/Behavioral:  Positive for hallucinations. Negative for depression, substance abuse and suicidal ideas. The patient is nervous/anxious and has insomnia.   Blood pressure 128/89, pulse (!) 118, temperature 98.2 F (36.8 C), temperature source Oral, resp. rate 18, height 5\' 4"  (1.626 m), weight 88 kg, SpO2 97 %. Body mass index is 33.3 kg/m.  Treatment Plan Summary: Medication management and Plan 51 year old woman who has paranoia and hallucinations relatively new onset with no previous psychiatric history.  No accompanying symptoms that would be highly suggestive of depression.  No evidence of substance abuse.  As a result the presumed cause of symptoms would be organic related to her stroke or possibly to medication exposure.  It was noted that she was on Keppra for a past history of seizures  related to the stroke.  Last time she was here I considered whether the [place] could be contributing to her symptoms and slightly decreased it.  No report of any new onset seizures since then.  Plan for now would be to restart the Risperdal and increase the dose to 2 mg monitor for side effects.  Get in touch with family.  One problem is that last time in the hospital she did not report any psychotic symptoms while she was here they appeared to be present primarily or only when she is in her apartment which makes it difficult to be certain that she will not reexperience symptoms at discharge.  Observation Level/Precautions:  15 minute checks  Laboratory:  UA  Psychotherapy:    Medications:    Consultations:    Discharge Concerns:    Estimated LOS:  Other:     Physician Treatment Plan for Primary Diagnosis: Organic psychosis Long Term Goal(s): Improvement in symptoms so as ready for discharge  Short Term Goals: Ability to verbalize feelings will improve and Ability to demonstrate self-control will improve  Physician Treatment Plan for Secondary Diagnosis: Principal Problem:   Organic psychosis Active Problems:   H/O: CVA (cerebrovascular accident)   Hx of seizure disorder  Long Term Goal(s): Improvement in symptoms so as ready for discharge  Short Term Goals:  Ability to maintain clinical measurements within normal limits will improve and Compliance with prescribed medications will improve  I certify that inpatient services furnished can reasonably be expected to improve the patient's condition.    Alethia Berthold, MD 1/25/20233:07 PM

## 2021-03-04 NOTE — Progress Notes (Signed)
Recreation Therapy Notes    Date: 03/04/2021  Time: 10:30   Location: Craft room      Behavioral response: N/A   Intervention Topic: Happiness    Discussion/Intervention: Patient refused to attend group.   Clinical Observations/Feedback:  Patient refused to attend group.    Shaquoya Cosper LRT/CTRS       Emma-Lee Oddo 03/04/2021 12:13 PM

## 2021-03-04 NOTE — BH IP Treatment Plan (Signed)
Interdisciplinary Treatment and Diagnostic Plan Update  03/04/2021 Time of Session: 9:30AM Melissa Day MRN: [id number]  Principal Diagnosis: Organic psychosis  Secondary Diagnoses: Principal Problem:   Organic psychosis   Current Medications:  Current Facility-Administered Medications  Medication Dose Route Frequency Provider Last Rate Last Admin   acetaminophen (TYLENOL) tablet 650 mg  650 mg Oral Q6H PRN Clapacs, John T, MD       alum & mag hydroxide-simeth (MAALOX/MYLANTA) 200-200-20 MG/5ML suspension 30 mL  30 mL Oral Q4H PRN Clapacs, Madie Reno, MD       aspirin EC tablet 81 mg  81 mg Oral Daily Clapacs, Madie Reno, MD   81 mg at 03/04/21 0817   atorvastatin (LIPITOR) tablet 40 mg  40 mg Oral Daily Clapacs, John T, MD   40 mg at 03/03/21 2020   FLUoxetine (PROZAC) capsule 40 mg  40 mg Oral Daily Clapacs, Madie Reno, MD   40 mg at 03/04/21 0816   levETIRAcetam (KEPPRA) tablet 500 mg  500 mg Oral BID Clapacs, Madie Reno, MD   500 mg at 03/04/21 0816   levothyroxine (SYNTHROID) tablet 100 mcg  100 mcg Oral Q0600 Clapacs, John T, MD   100 mcg at 03/04/21 0645   losartan (COZAAR) tablet 50 mg  50 mg Oral Daily Clapacs, John T, MD   50 mg at 03/04/21 0815   magnesium hydroxide (MILK OF MAGNESIA) suspension 30 mL  30 mL Oral Daily PRN Clapacs, John T, MD       metFORMIN (GLUCOPHAGE) tablet 500 mg  500 mg Oral BID WC Clapacs, John T, MD   500 mg at 03/04/21 0817   risperiDONE (RISPERDAL) tablet 1 mg  1 mg Oral QHS Clapacs, John T, MD   1 mg at 03/03/21 2020   PTA Medications: Medications Prior to Admission  Medication Sig Dispense Refill Last Dose   aspirin EC 81 MG tablet Take 81 mg by mouth daily.      atorvastatin (LIPITOR) 40 MG tablet Take 1 tablet (40 mg total) by mouth daily. 30 tablet 3    baclofen (LIORESAL) 10 MG tablet Take 5 mg by mouth 2 (two) times daily.      FLUoxetine (PROZAC) 40 MG capsule Take 40 mg by mouth daily.      levETIRAcetam (KEPPRA) 500 MG tablet Take 1 tablet (500 mg  total) by mouth 2 (two) times daily. 60 tablet 1    levothyroxine (SYNTHROID) 100 MCG tablet Take 1 tablet (100 mcg total) by mouth daily before breakfast. 30 tablet 1    losartan (COZAAR) 50 MG tablet Take 50 mg by mouth daily.      metFORMIN (GLUCOPHAGE) 500 MG tablet Take 500 mg by mouth daily with breakfast.      risperiDONE (RISPERDAL) 0.5 MG tablet Take 1 tablet (0.5 mg total) by mouth at bedtime. 30 tablet 1     Patient Stressors: Medication change or noncompliance    Patient Strengths: Motivation for treatment/growth   Treatment Modalities: Medication Management, Group therapy, Case management,  1 to 1 session with clinician, Psychoeducation, Recreational therapy.   Physician Treatment Plan for Primary Diagnosis: Organic psychosis Long Term Goal(s):     Short Term Goals:    Medication Management: Evaluate patient's response, side effects, and tolerance of medication regimen.  Therapeutic Interventions: 1 to 1 sessions, Unit Group sessions and Medication administration.  Evaluation of Outcomes: Not Met  Physician Treatment Plan for Secondary Diagnosis: Principal Problem:   Organic psychosis  Long Term Goal(s):  Short Term Goals:       Medication Management: Evaluate patient's response, side effects, and tolerance of medication regimen.  Therapeutic Interventions: 1 to 1 sessions, Unit Group sessions and Medication administration.  Evaluation of Outcomes: Not Met   RN Treatment Plan for Primary Diagnosis: Organic psychosis Long Term Goal(s): Knowledge of disease and therapeutic regimen to maintain health will improve  Short Term Goals: Ability to demonstrate self-control, Ability to participate in decision making will improve, Ability to verbalize feelings will improve, Ability to identify and develop effective coping behaviors will improve, and Compliance with prescribed medications will improve  Medication Management: RN will administer medications as ordered  by provider, will assess and evaluate patient's response and provide education to patient for prescribed medication. RN will report any adverse and/or side effects to prescribing provider.  Therapeutic Interventions: 1 on 1 counseling sessions, Psychoeducation, Medication administration, Evaluate responses to treatment, Monitor vital signs and CBGs as ordered, Perform/monitor CIWA, COWS, AIMS and Fall Risk screenings as ordered, Perform wound care treatments as ordered.  Evaluation of Outcomes: Not Met   LCSW Treatment Plan for Primary Diagnosis: Organic psychosis Long Term Goal(s): Safe transition to appropriate next level of care at discharge, Engage patient in therapeutic group addressing interpersonal concerns.  Short Term Goals: Engage patient in aftercare planning with referrals and resources, Increase social support, Increase ability to appropriately verbalize feelings, Increase emotional regulation, Facilitate acceptance of mental health diagnosis and concerns, and Increase skills for wellness and recovery  Therapeutic Interventions: Assess for all discharge needs, 1 to 1 time with Social worker, Explore available resources and support systems, Assess for adequacy in community support network, Educate family and significant other(s) on suicide prevention, Complete Psychosocial Assessment, Interpersonal group therapy.  Evaluation of Outcomes: Not Met   Progress in Treatment: Attending groups: No. Participating in groups: No. Taking medication as prescribed: Yes. Toleration medication: Yes. Family/Significant other contact made: No, will contact:  once permission is given Patient understands diagnosis: Yes. Discussing patient identified problems/goals with staff: Yes. Medical problems stabilized or resolved: Yes. Denies suicidal/homicidal ideation: Yes. Issues/concerns per patient self-inventory: No. Other: none  New problem(s) identified: No, Describe:  none  New Short  Term/Long Term Goal(s): elimination of symptoms of psychosis, medication management for mood stabilization; elimination of SI thoughts; development of comprehensive mental wellness/sobriety plan.   Patient Goals:  "happiness, joy"  Discharge Plan or Barriers: CSW will assist patient in developing appropriate discharge plans.   Reason for Continuation of Hospitalization: Anxiety Depression Medication stabilization  Estimated Length of Stay: 1-7 days   Scribe for Treatment Team: Gates Rigg 03/04/2021 12:33 PM
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
## 2021-03-04 NOTE — [facility] Counselor (Signed)
Adult Comprehensive Assessment  Patient ID: Melissa Day, female   DOB: [birth year], 51 y.o.   MRN: [id number]  Information Source: Information source: Patient  Current Stressors:  Patient states their primary concerns and needs for treatment are:: "voices" Patient states their goals for this hospitilization and ongoing recovery are:: "happiness, joyful" Educational / Learning stressors: Pt denies.  Employment / Job issues: Pt denies. Family Relationships: my sister and brother in law separated Pt also identifies me as the barrier. Financial / Lack of resources (include bankruptcy): Pt denies. Housing / Lack of housing: Pt denies. 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  Social relationships: Pt denies. Substance abuse: Pt denies. Bereavement / Loss: Pt denies.   Living/Environment/Situation:  Living Arrangements: Spouse/Significant Other Living conditions (as described by patient or guardian): I think my partner is locked up, he was locked up today Who else lives in the home?: Pts partner How long has patient lived in current situation?: 4 years What is atmosphere in current home: Other(Good)   Family History:  Marital status: Long term relationship  Long term relationship, how long?: 4 years" Does patient have children?: Yes How many children?: 1 How is patient's relationship with their children?: he don't talk to me   Childhood History:  By whom was/is the patient raised?: Both parents Description of patient's relationship with caregiver when they were a child: good Patient's description of current relationship with people who raised him/her: Pt reports a good relationship and indicates that she is closer with her father.  How were you disciplined when you got in trouble as a child/adolescent?: Previous assessment reports spankings Does patient have  siblings?: Yes  Number of Siblings: 1 Description of patient's current relationship with siblings: good Did patient suffer any verbal/emotional/physical/sexual abuse as a child?: No Did patient suffer from severe childhood neglect?: No Has patient ever been sexually abused/assaulted/raped as an adolescent or adult?: No Was the patient ever a victim of a crime or a disaster?: No Witnessed domestic violence?: No Has patient been affected by domestic violence as an adult?: No   Education:  Highest grade of school patient has completed: high school Learning disability?: No    Employment/Work Situation:   Why is Patient on Disability: Stroke How Long has Patient Been on Disability: 11 years" Patient's Job has Been Impacted by Current Illness: No Has Patient ever Been in the [employer]?: No   Financial Resources:   Museum/gallery curator resources: Medicaid, Medicare Does patient have a Programmer, applications or guardian?: No   Alcohol/Substance Abuse:   What has been your use of drugs/alcohol within the last 12 months?: Pt denies If attempted suicide, did drugs/alcohol play a role in this?: No Alcohol/Substance Abuse Treatment Hx: Denies past history Has alcohol/substance abuse ever caused legal problems?: No  Social Support System:   Pensions consultant Support System: Good Describe Community Support System: Mordecai Rasmussen, daddy Type of faith/religion: Baptist" How does patient's faith help to cope with current illness?: I don't know anymore   Leisure/Recreation:   Do You Have Hobbies?: No   Strengths/Needs:   What is the patient's perception of their strengths?: "my happiness" Patient states they can use these personal strengths during their treatment to contribute to their recovery: Pt denies. Patient states these barriers may affect/interfere with their treatment: Pt denies. Patient states these barriers may affect their return to the community: Pt denies.   Discharge Plan:  Currently receiving community mental health services: No Patient states concerns and preferences for aftercare planning are: Pt reports that she is open to seeing a provider that accepts her insurance.  Does patient have access to transportation?: Yes Does patient have financial barriers related to discharge medications?: No Will patient be returning to same living situation after discharge?: Yes   Summary/Recommendations:   Summary and Recommendations (to be completed by the evaluator): Patient is a 51 year old female [place] [facility] Patient was recently admitted to [facility] for paranoia and delusions, she was IVCed by her mother for continued paranoia delusions.  She identifies current triggers as voices.  Pt unable to elaborate on if she is hearing auditory hallucinations or if she is having issues with her upstairs neighbors.  Pt reports that she is not current with a mental health provider and has had some difficulty in identifying a provider that accepts her insurance.  Recommendations include: crisis stabilization, therapeutic milieu, encourage group attendance and participation, medication management for mood stabilization and development of comprehensive mental wellness plan  Rozann Lesches. 03/04/2021
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
## 2021-03-04 NOTE — Plan of Care (Signed)
°  Problem: Education: °Goal: Emotional status will improve °Outcome: Progressing °Goal: Mental status will improve °Outcome: Progressing °Goal: Verbalization of understanding the information provided will improve °Outcome: Progressing °  °

## 2021-03-04 NOTE — Progress Notes (Signed)
Recreation Therapy Notes  INPATIENT RECREATION TR PLAN  Patient Details Name: Melissa Day MRN: [id number] DOB: 1970-09-29 Today's Date: 03/04/2021  Rec Therapy Plan Is patient appropriate for Therapeutic Recreation?: Yes Treatment times per week: at least 3 Estimated Length of Stay: 5-7 days TR Treatment/Interventions: Group participation (Comment)  Discharge Criteria Pt will be discharged from therapy if:: Discharged Treatment plan/goals/alternatives discussed and agreed upon by:: Patient/family  Discharge Summary     Tam Delisle 03/04/2021, 4:06 PM

## 2021-03-04 NOTE — Progress Notes (Signed)
Recreation Therapy Notes  INPATIENT RECREATION THERAPY ASSESSMENT  Patient Details Name: Melissa Day MRN: [id number] DOB: 07-16-1970 Today's Date: 03/04/2021       Information Obtained From: Patient  Able to Participate in Assessment/Interview: Yes  Patient Presentation: Responsive  Reason for Admission (Per Patient): Active Symptoms  Patient Stressors:    Coping Skills:   TV, Music  Leisure Interests (2+):  [employer], Music - Listen, Individual - TV  Frequency of Recreation/Participation: Monthly  Awareness of Community Resources:  Yes  Community Resources:  [place]  Current Use: Yes  If no, Barriers?:    Expressed Interest in [employer] Information: Yes  County of Residence:  [place]  Patient Main Form of Transportation: Other (Comment) (Sister/mom)  Patient Strengths:  N/A  Patient Identified Areas of Improvement:  Being happy  Patient Goal for Hospitalization:  Happiness  Current SI (including self-harm):  No  Current HI:  No  Current AVH: No  Staff Intervention Plan: Group Attendance, Collaborate with Interdisciplinary Treatment Team  Consent to Intern Participation: N/A  Jamari Diana 03/04/2021, 4:06 PM

## 2021-03-04 NOTE — Group Note (Signed)
[facility] LCSW Group Therapy Note   Group Date: 01/12/2022 Start Time: 1300 End Time: 1400   Type of Therapy/Topic:  Group Therapy:  Emotion Regulation  Participation Level:  Did Not Attend   Mood:  Description of Group:    The purpose of this group is to assist patients in learning to regulate negative emotions and experience positive emotions. Patients will be guided to discuss ways in which they have been vulnerable to their negative emotions. These vulnerabilities will be juxtaposed with experiences of positive emotions or situations, and patients challenged to use positive emotions to combat negative ones. Special emphasis will be placed on coping with negative emotions in conflict situations, and patients will process healthy conflict resolution skills.  Therapeutic Goals: Patient will identify two positive emotions or experiences to reflect on in order to balance out negative emotions:  Patient will label two or more emotions that they find the most difficult to experience:  Patient will be able to demonstrate positive conflict resolution skills through discussion or role plays:   Summary of Patient Progress:   Patient did not attend group despite encouraged participation.     Therapeutic Modalities:   Cognitive Behavioral Therapy Feelings Identification Dialectical Behavioral Therapy   Hailynn Slovacek W Stirling Orton, LCSWA 

## 2021-03-05 DIAGNOSIS — F09 Unspecified mental disorder due to known physiological condition: Secondary | ICD-10-CM | POA: Diagnosis not present

## 2021-03-05 NOTE — Progress Notes (Signed)
Patient denies pain. Patient denies anxiety and depression. Patient denies SI/HI/AVH. Patient compliant with medication administration. Patient interacting with peers. Patient occasionally seen wandering around the unit. Q15 minute safety checks maintained. Patient remains safe on the unit at time.

## 2021-03-05 NOTE — [facility] Counselor (Signed)
CSW received call from pt's mother inquiring how she was doing.  CSW updated that at this time the patient had not been out of her room to interact with this CSW or activities held on the unit.    Mother complained that the psychiatrist and pt's nurse on 03/04/2021 did not contact her as requested.  CSW checked and confirmed that CSW did send message containing correct contact name and number for patient. CSW apologized and reports that she will pass the message on again.  Assunta Curtis, MSW, LCSW 03/05/2021 4:27 PM

## 2021-03-05 NOTE — Progress Notes (Signed)
Patient pleasant and cooperative.  Difficulty with speaking makes conversation hard for patient. She usually nods when asked questions. Tonight she has asked to go to bed early reporting being tired.  She denies si  hi avh depression. She does endorse having some anxiety.Will continue to monitor with q 15 minute safety rounds.  Encouraged her to seek staff with any concerns.   C. Butler-Nicholson,LPN

## 2021-03-05 NOTE — Progress Notes (Signed)
[facility] MD Progress Note  03/05/2021 2:37 PM Melissa Day  MRN:  [id number] Subjective: Follow-up for this patient admitted because of ongoing paranoia and organic psychosis.  Patient seen and chart reviewed.  Patient has no complaints today.  Affect is smiling and upbeat.  She is cooperating with medication and basic treatment although she mostly stays to herself.  She appears to understand pretty much everything that is said to her despite her difficulty with communicating.  She denies having any hallucinations or feelings of paranoia in the hospital. Principal Problem: Organic psychosis Diagnosis: Principal Problem:   Organic psychosis Active Problems:   H/O: CVA (cerebrovascular accident)   Hx of seizure disorder  Total Time spent with patient: 30 minutes  Past Psychiatric History: Past history of multiple visits for treatment for these symptoms of paranoia  Past Medical History:  Past Medical History:  Diagnosis Date   Anemia    Cancer ([place]) 2018   br cancer on left with lump and rad tx    Diabetes mellitus without complication ([place])    Family history of colonic polyps    H/O: CVA (cerebrovascular accident) 2012   a. cerebral edema, craniotomy, residual right upper & lower limb weakness   Heart murmur    High cholesterol    History of colon polyps    History of echocardiogram    a. 12/01/2013: EF 50-55%, nl global LV systolic function, mild MR, mildly increased LV posterior wall thickness   History of kidney stones    History of stress test    a. 12/01/2013: no significant ischemia, no significant WMA, no EKG changes concerning for ischemia, EF 67%, low risk scan   Hx of seizure disorder    a. one episode 10 months after CVA, since then on Keppra    Hypothyroidism    Personal history of radiation therapy 2019   LEFT lumpectomy   Seizures ([place])    Stroke ([place]) 2012   Thyroid disease     Past Surgical History:  Procedure Laterality Date   BRAIN SURGERY      decompression after stroke   BREAST BIOPSY Left 12/13/2016   path pending   BREAST LUMPECTOMY Left 01/06/2017   DUCTAL CARCINOMA IN SITU, NUCLEAR GRADE 3.   BREAST LUMPECTOMY WITH NEEDLE LOCALIZATION Left 01/06/2017   Procedure: BREAST LUMPECTOMY WITH NEEDLE LOCALIZATION;  Surgeon: Leonie Green, MD;  Location: [facility] ORS;  Service: General;  Laterality: Left;   COLONOSCOPY WITH PROPOFOL N/A 11/18/2014   Procedure: COLONOSCOPY WITH PROPOFOL;  Surgeon: Josefine Class, MD;  Location: [facility] ENDOSCOPY;  Service: Endoscopy;  Laterality: N/A;   COLONOSCOPY WITH PROPOFOL N/A 08/19/2020   Procedure: COLONOSCOPY WITH PROPOFOL;  Surgeon: Lesly Rubenstein, MD;  Location: [facility] ENDOSCOPY;  Service: Endoscopy;  Laterality: N/A;   CYSTOSCOPY/RETROGRADE/URETEROSCOPY/STONE EXTRACTION WITH BASKET Right 10/29/2014   Procedure: CYSTOSCOPY/URETEROSCOPY/STONE EXTRACTION WITH BASKET;  Surgeon: Franchot Gallo, MD;  Location: [facility] ORS;  Service: Urology;  Laterality: Right;   ESOPHAGOGASTRODUODENOSCOPY (EGD) WITH PROPOFOL N/A 11/18/2014   Procedure: ESOPHAGOGASTRODUODENOSCOPY (EGD) WITH PROPOFOL;  Surgeon: Josefine Class, MD;  Location: [facility] ENDOSCOPY;  Service: Endoscopy;  Laterality: N/A;   RE-EXCISION OF BREAST LUMPECTOMY Left 01/25/2017   Procedure: RE-EXCISION OF BREAST LUMPECTOMY;  Surgeon: Leonie Green, MD;  Location: [facility] ORS;  Service: General;  Laterality: Left;   SUBMANDIBULAR GLAND EXCISION Right 12/15/2017   Procedure: EXCISION SUBMANDIBULAR GLAND;  Surgeon: Carloyn Manner, MD;  Location: [facility] ORS;  Service: ENT;  Laterality: Right;   TONSILLECTOMY  Family History:  Family History  Problem Relation Age of Onset   Nephrolithiasis Mother    Non-Hodgkin's lymphoma Mother 17       currently 63   Heart disease Father    CVA Father    Nephrolithiasis Maternal Grandfather    Diabetes Maternal Grandfather    Pancreatic cancer Maternal Grandfather 64       deceased 58    Breast cancer Sister 2       negative genetic testing; currently 53   Lymphoma Maternal Grandmother        deceased 18   Breast cancer Paternal Aunt        4 more paternal aunts with breast cancer dx 46s-70s   Breast cancer Cousin        2 paternal cousins; daughters of pat aunt w/ BC at 73   Colon cancer Cousin 80       son of mat aunt   Breast cancer Paternal Aunt 44       deceased 52 of MI   Family Psychiatric  History: See previous Social History:  Social History   Substance and Sexual Activity  Alcohol Use No     Social History   Substance and Sexual Activity  Drug Use No    Social History   Socioeconomic History   Marital status: Divorced    Spouse name: Not on file   Number of children: Not on file   Years of education: Not on file   Highest education level: Not on file  Occupational History   Not on file  Tobacco Use   Smoking status: Former    Packs/day: 1.00    Years: 15.00    Pack years: 15.00    Types: Cigarettes    Quit date: 12/30/2011    Years since quitting: 9.1   Smokeless tobacco: Never  Vaping Use   Vaping Use: Never used  Substance and Sexual Activity   Alcohol use: No   Drug use: No   Sexual activity: Never  Other Topics Concern   Not on file  Social History Narrative   Not on file   Social Determinants of Health   Financial Resource Strain: Not on file  Food Insecurity: Not on file  Transportation Needs: Not on file  Physical Activity: Not on file  Stress: Not on file  Social Connections: Not on file   Additional Social History:                         Sleep: Fair  Appetite:  Fair  Current Medications: Current Facility-Administered Medications  Medication Dose Route Frequency Provider Last Rate Last Admin   acetaminophen (TYLENOL) tablet 650 mg  650 mg Oral Q6H PRN Jalana Moore T, MD   650 mg at 03/04/21 2019   alum & mag hydroxide-simeth (MAALOX/MYLANTA) 200-200-20 MG/5ML suspension 30 mL  30 mL Oral Q4H PRN  Kristan Brummitt, Madie Reno, MD       aspirin EC tablet 81 mg  81 mg Oral Daily Marirose Deveney T, MD   81 mg at 03/05/21 0756   atorvastatin (LIPITOR) tablet 40 mg  40 mg Oral Daily Malashia Kamaka T, MD   40 mg at 03/04/21 2019   levETIRAcetam (KEPPRA) tablet 500 mg  500 mg Oral BID Kaitlynn Tramontana, Madie Reno, MD   500 mg at 03/05/21 0756   levothyroxine (SYNTHROID) tablet 100 mcg  100 mcg Oral Q0600 Garrin Kirwan, Madie Reno, MD   100 mcg at  03/05/21 0654   losartan (COZAAR) tablet 50 mg  50 mg Oral Daily Khylee Algeo T, MD   50 mg at 03/05/21 0756   magnesium hydroxide (MILK OF MAGNESIA) suspension 30 mL  30 mL Oral Daily PRN Monico Sudduth T, MD       metFORMIN (GLUCOPHAGE) tablet 500 mg  500 mg Oral BID WC Bay Jarquin T, MD   500 mg at 03/05/21 0756   risperiDONE (RISPERDAL) tablet 2 mg  2 mg Oral QHS Bayan Hedstrom T, MD   2 mg at 03/04/21 2019    Lab Results: No results found for this or any previous visit (from the past 48 hour(s)).  Blood Alcohol level:  Lab Results  Component Value Date   ETH <10 03/02/2021   ETH <10 11/24/5100    Metabolic Disorder Labs: Lab Results  Component Value Date   HGBA1C 6.8 (H) 02/25/2021   MPG 148.46 02/25/2021   No results found for: PROLACTIN Lab Results  Component Value Date   CHOL 120 02/25/2021   TRIG 76 02/25/2021   HDL 43 02/25/2021   CHOLHDL 2.8 02/25/2021   VLDL 15 02/25/2021   LDLCALC 62 02/25/2021   LDLCALC 94 12/01/2013    Physical Findings: AIMS:  , ,  ,  ,    CIWA:    COWS:     Musculoskeletal: Strength & Muscle Tone: within normal limits Gait & Station: normal Patient leans: N/A  Psychiatric Specialty Exam:  Presentation  General Appearance: Bizarre  Eye Contact:Good  Speech:Other (comment)  Speech Volume:Decreased  Handedness:Right   Mood and Affect  Mood:Anxious; Depressed  Affect:Flat; Inappropriate; Depressed; Blunt   Thought Process  Thought Processes:Disorganized  Descriptions of  Associations:Loose  Orientation:Partial  Thought Content:Illogical; Rumination  History of Schizophrenia/Schizoaffective disorder:No  Duration of Psychotic Symptoms:Greater than six months  Hallucinations:No data recorded Ideas of Reference:Paranoia  Suicidal Thoughts:No data recorded Homicidal Thoughts:No data recorded  Sensorium  Memory:Immediate Fair; Recent Fair; Remote Fair  Judgment:Poor  Insight:Poor   Executive Functions  Concentration:Fair  Attention Span:Fair  [place]   Psychomotor Activity  Psychomotor Activity:No data recorded  Assets  Assets:Communication Skills; Desire for Improvement; Social Support; Physical Health; Housing   Sleep  Sleep:No data recorded   Physical Exam: Physical Exam Vitals and nursing note reviewed.  Constitutional:      Appearance: Normal appearance.  HENT:     Head: Normocephalic and atraumatic.     Mouth/Throat:     Pharynx: Oropharynx is clear.  Eyes:     Pupils: Pupils are equal, round, and reactive to light.  Cardiovascular:     Rate and Rhythm: Normal rate and regular rhythm.  Pulmonary:     Effort: Pulmonary effort is normal.     Breath sounds: Normal breath sounds.  Abdominal:     General: Abdomen is flat.     Palpations: Abdomen is soft.  Musculoskeletal:        General: Normal range of motion.  Skin:    General: Skin is warm and dry.  Neurological:     General: No focal deficit present.     Mental Status: She is alert. Mental status is at baseline.  Psychiatric:        Attention and Perception: Attention normal.        Mood and Affect: Mood normal.        Speech: She is noncommunicative.        Behavior: Behavior is cooperative.  Thought Content: Thought content normal.        Cognition and Memory: Cognition normal.   Review of Systems  Constitutional: Negative.   HENT: Negative.    Eyes: Negative.   Respiratory: Negative.     Cardiovascular: Negative.   Gastrointestinal: Negative.   Musculoskeletal: Negative.   Skin: Negative.   Neurological: Negative.   Psychiatric/Behavioral: Negative.    Blood pressure (!) 152/93, pulse (!) 130, temperature 97.8 F (36.6 C), temperature source Oral, resp. rate 18, height 5\' 4"  (1.626 m), weight 88 kg, SpO2 97 %. Body mass index is 33.3 kg/m.   Treatment Plan Summary: Medication management and Plan patient was started on Risperdal last night.  Tolerated without any complaints of side effects.  No evidence stiffness or akathisia.  Denying symptoms today.  Patient seems to be calm and has not shown any agitation.  Continue current medication plan given at least another day or 2 to see that she is staying stable.  Patient has no argument with that  Alethia Berthold, MD 03/05/2021, 2:37 PM

## 2021-03-05 NOTE — Group Note (Signed)
[facility] LCSW Group Therapy Note   Group Date: 03/05/2021 Start Time: 1300 End Time: 1400   Type of Therapy/Topic:  Group Therapy:  Balance in Life  Participation Level:  Did Not Attend   Description of Group:    This group will address the concept of balance and how it feels and looks when one is unbalanced. Patients will be encouraged to process areas in their lives that are out of balance, and identify reasons for remaining unbalanced. Facilitators will guide patients utilizing problem- solving interventions to address and correct the stressor making their life unbalanced. Understanding and applying boundaries will be explored and addressed for obtaining  and maintaining a balanced life. Patients will be encouraged to explore ways to assertively make their unbalanced needs known to significant others in their lives, using other group members and facilitator for support and feedback.  Therapeutic Goals: Patient will identify two or more emotions or situations they have that consume much of in their lives. Patient will identify signs/triggers that life has become out of balance:  Patient will identify two ways to set boundaries in order to achieve balance in their lives:  Patient will demonstrate ability to communicate their needs through discussion and/or role plays  Summary of Patient Progress:    X    Therapeutic Modalities:   Cognitive Behavioral Therapy Solution-Focused Therapy Assertiveness Training   Rozann Lesches, LCSW

## 2021-03-05 NOTE — Progress Notes (Signed)
Recreation Therapy Notes    Date: 03/05/2021  Time: 11:00am    Location: Craft room      Behavioral response: N/A   Intervention Topic: Stress Management   Discussion/Intervention: Patient refused to attend group.   Clinical Observations/Feedback:  Patient refused to attend group.    Eulia Hatcher LRT/CTRS        Inmer Nix 03/05/2021 1:03 PM

## 2021-03-06 NOTE — Progress Notes (Signed)
D: Pt alert and oriented. Pt denies experiencing any anxiety/depression at this time. Pt denies experiencing any pain at this time. Pt denies experiencing any SI/HI, or AVH at this time.   Pt calm and cooperative. This evening began having hallucinations, seeing someone/something in the ceiling or coming down from the ceiling.   A: Scheduled medications administered to pt, per MD orders. Support and encouragement provided. Frequent verbal contact made. Routine safety checks conducted q15 minutes.   R: No adverse drug reactions noted. Pt verbally contracts for safety at this time. Pt complaint with medications and treatment plan. Pt interacts well with others on the unit. Pt remains safe at this time. Will continue to monitor.

## 2021-03-06 NOTE — Progress Notes (Signed)
[facility] MD Progress Note  03/06/2021 3:44 PM Melissa LIGHTSEY  MRN:  [id number] Subjective: Follow-up for this 51 year old woman with paranoia at home presumably related to organic changes.  Patient seen and chart reviewed.  Patient denies any symptoms at all.  Denies any sense of depression and denies any hallucinations or paranoia.  Has not been complaining of anything paranoid or psychotic like to staff.  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 cerebral edema, craniotomy, residual right upper & lower limb weakness   Heart murmur    High cholesterol    History of colon polyps    History of echocardiogram    a. 12/01/2013: EF 50-55%, nl global LV systolic function, mild MR, mildly increased LV posterior wall thickness   History of kidney stones    History of stress test    a. 12/01/2013: no significant ischemia, no significant WMA, no EKG changes concerning for ischemia, EF 67%, low risk scan   Hx of seizure disorder    a. one episode 10 months after CVA, since then on Keppra    Hypothyroidism    Personal history of radiation therapy 2019   LEFT lumpectomy   Seizures ([place])    Stroke ([place]) 2012   Thyroid disease     Past Surgical History:  Procedure Laterality Date   BRAIN SURGERY     decompression after stroke   BREAST BIOPSY Left 12/13/2016   path pending   BREAST LUMPECTOMY Left  01/06/2017   DUCTAL CARCINOMA IN SITU, NUCLEAR GRADE 3.   BREAST LUMPECTOMY WITH NEEDLE LOCALIZATION Left 01/06/2017   Procedure: BREAST LUMPECTOMY WITH NEEDLE LOCALIZATION;  Surgeon: Leonie Green, MD;  Location: [facility] ORS;  Service: General;  Laterality: Left;   COLONOSCOPY WITH PROPOFOL N/A 11/18/2014   Procedure: COLONOSCOPY WITH PROPOFOL;  Surgeon: Josefine Class, MD;  Location: [facility] ENDOSCOPY;  Service: Endoscopy;  Laterality: N/A;   COLONOSCOPY WITH PROPOFOL N/A 08/19/2020   Procedure: COLONOSCOPY WITH PROPOFOL;  Surgeon: Lesly Rubenstein, MD;  Location: [facility] ENDOSCOPY;  Service: Endoscopy;  Laterality: N/A;   CYSTOSCOPY/RETROGRADE/URETEROSCOPY/STONE EXTRACTION WITH BASKET Right 10/29/2014   Procedure: CYSTOSCOPY/URETEROSCOPY/STONE EXTRACTION WITH BASKET;  Surgeon: Franchot Gallo, MD;  Location: [facility] ORS;  Service: Urology;  Laterality: Right;   ESOPHAGOGASTRODUODENOSCOPY (EGD) WITH PROPOFOL N/A 11/18/2014   Procedure: ESOPHAGOGASTRODUODENOSCOPY (EGD) WITH PROPOFOL;  Surgeon: Josefine Class, MD;  Location: [facility] ENDOSCOPY;  Service: Endoscopy;  Laterality: N/A;   RE-EXCISION OF BREAST LUMPECTOMY Left 01/25/2017   Procedure: RE-EXCISION OF BREAST LUMPECTOMY;  Surgeon: Leonie Green, MD;  Location: [facility] ORS;  Service: General;  Laterality: Left;   SUBMANDIBULAR GLAND EXCISION Right 12/15/2017   Procedure: EXCISION SUBMANDIBULAR GLAND;  Surgeon: Carloyn Manner, MD;  Location: [facility] ORS;  Service: ENT;  Laterality: Right;   TONSILLECTOMY     Family History:  Family History  Problem Relation Age of Onset  Nephrolithiasis Mother    Non-Hodgkin's lymphoma Mother 40       currently 52   Heart disease Father    CVA Father    Nephrolithiasis Maternal Grandfather    Diabetes Maternal Grandfather    Pancreatic cancer Maternal Grandfather 66       deceased 27   Breast cancer Sister 78       negative genetic testing; currently 27   Lymphoma Maternal Grandmother         deceased 49   Breast cancer Paternal Aunt        4 more paternal aunts with breast cancer dx 3s-70s   Breast cancer Cousin        2 paternal cousins; daughters of pat aunt w/ BC at 47   Colon cancer Cousin 42       son of mat aunt   Breast cancer Paternal Aunt 36       deceased 3 of MI   Family Psychiatric  History: See previous Social History:  Social History   Substance and Sexual Activity  Alcohol Use No     Social History   Substance and Sexual Activity  Drug Use No    Social History   Socioeconomic History   Marital status: Divorced    Spouse name: Not on file   Number of children: Not on file   Years of education: Not on file   Highest education level: Not on file  Occupational History   Not on file  Tobacco Use   Smoking status: Former    Packs/day: 1.00    Years: 15.00    Pack years: 15.00    Types: Cigarettes    Quit date: 12/30/2011    Years since quitting: 9.1   Smokeless tobacco: Never  Vaping Use   Vaping Use: Never used  Substance and Sexual Activity   Alcohol use: No   Drug use: No   Sexual activity: Never  Other Topics Concern   Not on file  Social History Narrative   Not on file   Social Determinants of Health   Financial Resource Strain: Not on file  Food Insecurity: Not on file  Transportation Needs: Not on file  Physical Activity: Not on file  Stress: Not on file  Social Connections: Not on file   Additional Social History:                         Sleep: Fair  Appetite:  Fair  Current Medications: Current Facility-Administered Medications  Medication Dose Route Frequency Provider Last Rate Last Admin   acetaminophen (TYLENOL) tablet 650 mg  650 mg Oral Q6H PRN Adarrius Graeff T, MD   650 mg at 03/04/21 2019   alum & mag hydroxide-simeth (MAALOX/MYLANTA) 200-200-20 MG/5ML suspension 30 mL  30 mL Oral Q4H PRN Nickolette Espinola, Madie Reno, MD       aspirin EC tablet 81 mg  81 mg Oral Daily Sairah Knobloch, Madie Reno, MD   81 mg at  03/06/21 0808   atorvastatin (LIPITOR) tablet 40 mg  40 mg Oral Daily Maleak Brazzel T, MD   40 mg at 03/05/21 2105   levETIRAcetam (KEPPRA) tablet 500 mg  500 mg Oral BID Trip Cavanagh, Madie Reno, MD   500 mg at 03/06/21 0808   levothyroxine (SYNTHROID) tablet 100 mcg  100 mcg Oral Q0600 Hitoshi Werts T, MD   100 mcg at 03/06/21 0607   losartan (COZAAR) tablet 50 mg  50 mg Oral  Daily Genesis Novosad, Madie Reno, MD   50 mg at 03/06/21 3976   magnesium hydroxide (MILK OF MAGNESIA) suspension 30 mL  30 mL Oral Daily PRN Christia Coaxum T, MD       metFORMIN (GLUCOPHAGE) tablet 500 mg  500 mg Oral BID WC Semiyah Newgent, Madie Reno, MD   500 mg at 03/06/21 7341   risperiDONE (RISPERDAL) tablet 2 mg  2 mg Oral QHS Jene Huq, Madie Reno, MD   2 mg at 03/05/21 2105    Lab Results: No results found for this or any previous visit (from the past 48 hour(s)).  Blood Alcohol level:  Lab Results  Component Value Date   ETH <10 03/02/2021   ETH <10 93/79/0240    Metabolic Disorder Labs: Lab Results  Component Value Date   HGBA1C 6.8 (H) 02/25/2021   MPG 148.46 02/25/2021   No results found for: PROLACTIN Lab Results  Component Value Date   CHOL 120 02/25/2021   TRIG 76 02/25/2021   HDL 43 02/25/2021   CHOLHDL 2.8 02/25/2021   VLDL 15 02/25/2021   LDLCALC 62 02/25/2021   LDLCALC 94 12/01/2013    Physical Findings: AIMS:  , ,  ,  ,    CIWA:    COWS:     Musculoskeletal: Strength & Muscle Tone: within normal limits Gait & Station: broad based Patient leans: Right  Psychiatric Specialty Exam:  Presentation  General Appearance: Bizarre  Eye Contact:Good  Speech:Other (comment)  Speech Volume:Decreased  Handedness:Right   Mood and Affect  Mood:Anxious; Depressed  Affect:Flat; Inappropriate; Depressed; Blunt   Thought Process  Thought Processes:Disorganized  Descriptions of Associations:Loose  Orientation:Partial  Thought Content:Illogical; Rumination  History of Schizophrenia/Schizoaffective  disorder:No  Duration of Psychotic Symptoms:Greater than six months  Hallucinations:No data recorded Ideas of Reference:Paranoia  Suicidal Thoughts:No data recorded Homicidal Thoughts:No data recorded  Sensorium  Memory:Immediate Fair; Recent Fair; Remote Fair  Judgment:Poor  Insight:Poor   Executive Functions  Concentration:Fair  Attention Span:Fair  [place]   Psychomotor Activity  Psychomotor Activity:No data recorded  Assets  Assets:Communication Skills; Desire for Improvement; Social Support; Physical Health; Housing   Sleep  Sleep:No data recorded   Physical Exam: Physical Exam Vitals and nursing note reviewed.  Constitutional:      Appearance: Normal appearance.  HENT:     Head: Normocephalic and atraumatic.     Mouth/Throat:     Pharynx: Oropharynx is clear.  Eyes:     Pupils: Pupils are equal, round, and reactive to light.  Cardiovascular:     Rate and Rhythm: Normal rate and regular rhythm.  Pulmonary:     Effort: Pulmonary effort is normal.     Breath sounds: Normal breath sounds.  Abdominal:     General: Abdomen is flat.     Palpations: Abdomen is soft.  Musculoskeletal:        General: Normal range of motion.  Skin:    General: Skin is warm and dry.  Neurological:     Mental Status: She is alert. Mental status is at baseline.  Psychiatric:        Mood and Affect: Mood normal.        Thought Content: Thought content normal.   Review of Systems  Constitutional: Negative.   HENT: Negative.    Eyes: Negative.   Respiratory: Negative.    Cardiovascular: Negative.   Gastrointestinal: Negative.   Musculoskeletal: Negative.   Skin: Negative.   Neurological:  Positive for speech change and  weakness.  Psychiatric/Behavioral: Negative.    Blood pressure (!) 148/83, pulse (!) 105, temperature 98.2 F (36.8 C), temperature source Oral, resp. rate 18, height 5\' 4"  (1.626 m), weight 88 kg, SpO2  96 %. Body mass index is 33.3 kg/m.   Treatment Plan Summary: Medication management and Plan patient currently tolerating modest dose of Risperdal.  Denies any symptoms at all.  Does not appear to be paranoid bizarre or confused.  Mother called and requested an update and I will reach her before the end of the day.  Likely plan unless there is a change in symptoms would be continued treatment through the weekend and then discharge again  Alethia Berthold, MD 03/06/2021, 3:44 PM

## 2021-03-06 NOTE — Progress Notes (Signed)
Patient at the nurses station wanting to take meds to get an early start to bed. She has been active on the unit and gets along well with the other females on the unit. Her speech is much improved and her speech and thoughts are logical and coherent.  She denies si  hi avh depression and anxiety at this encounter.  Will continue to monitor with q15 minute safety rounds. Encouraged her to seek staff with any concerns.    Melissa Butler-Nicholson, LPN

## 2021-03-06 NOTE — Progress Notes (Signed)
Recreation Therapy Notes  Date: 03/06/2021  Time: 10:40 am   Location: Craft room      Behavioral response: N/A   Intervention Topic: Self-care  Discussion/Intervention: Patient refused to attend group.   Clinical Observations/Feedback:  Patient refused to attend group.    Jeorge Reister LRT/CTRS        Danijela Vessey 03/06/2021 12:46 PM

## 2021-03-06 NOTE — Group Note (Signed)
[facility] LCSW Group Therapy Note   Group Date: 03/06/2021 Start Time: 1300 End Time: 1400  Type of Therapy and Topic:  Group Therapy:  Feelings around Relapse and Recovery  Participation Level:  Did Not Attend   Mood:  Description of Group:    Patients in this group will discuss emotions they experience before and after a relapse. They will process how experiencing these feelings, or avoidance of experiencing them, relates to having a relapse. Facilitator will guide patients to explore emotions they have related to recovery. Patients will be encouraged to process which emotions are more powerful. They will be guided to discuss the emotional reaction significant others in their lives may have to patients relapse or recovery. Patients will be assisted in exploring ways to respond to the emotions of others without this contributing to a relapse.  Therapeutic Goals: Patient will identify two or more emotions that lead to relapse for them:  Patient will identify two emotions that result when they relapse:  Patient will identify two emotions related to recovery:  Patient will demonstrate ability to communicate their needs through discussion and/or role plays.   Summary of Patient Progress:  Patient did not attend group despite encouraged participation.    Therapeutic Modalities:   Cognitive Behavioral Therapy Solution-Focused Therapy Assertiveness Training Relapse Prevention Therapy   Durenda Hurt, [place]

## 2021-03-06 NOTE — Plan of Care (Signed)
°  Problem: Education: Goal: Emotional status will improve Outcome: Progressing Goal: Mental status will improve Outcome: Progressing Goal: Verbalization of understanding the information provided will improve Outcome: Progressing   Problem: Activity: Goal: Interest or engagement in activities will improve Outcome: Progressing Goal: Sleeping patterns will improve Outcome: Progressing

## 2021-03-07 NOTE — Plan of Care (Signed)
  Problem: Education: Goal: Knowledge of [facility] Education information/materials will improve Outcome: Progressing Goal: Emotional status will improve Outcome: Progressing Goal: Mental status will improve Outcome: Progressing Goal: Verbalization of understanding the information provided will improve Outcome: Progressing   Problem: Activity: Goal: Interest or engagement in activities will improve Outcome: Progressing Goal: Sleeping patterns will improve Outcome: Progressing   Problem: Coping: Goal: Ability to verbalize frustrations and anger appropriately will improve Outcome: Progressing Goal: Ability to demonstrate self-control will improve Outcome: Progressing   Problem: Health Behavior/Discharge Planning: Goal: Identification of resources available to assist in meeting health care needs will improve Outcome: Progressing Goal: Compliance with treatment plan for underlying cause of condition will improve Outcome: Progressing   Problem: Physical Regulation: Goal: Ability to maintain clinical measurements within normal limits will improve Outcome: Progressing   Problem: Safety: Goal: Periods of time without injury will increase Outcome: Progressing   

## 2021-03-07 NOTE — Progress Notes (Addendum)
[facility] MD Progress Note  03/07/2021 8:57 AM Melissa Day  MRN:  [id number] Subjective:  Patient is a mild-mannered 51 year old female who was initially seen for psychiatric evaluation by Dr. Weber Cooks on March 04, 2021.  Has a history of a stroke 10 years ago.  Has a primary diagnosis of organic psychosis.  Today, patient tells me that she is feeling pretty well.  Denies confusion hallucinations or delusions.  She is eating and sleeping well.  Smiles easily.Patient tells me that she is feeling better because she has "peace and quiet here." She still wonders if her neighbors will still mess with her after she leaves here.  Last night, she heard squeaking from the ceiling.  "I do not know I just talk back to them."  Does not elaborate further.  Principal Problem: Organic psychosis Diagnosis: Principal Problem:   Organic psychosis Active Problems:   H/O: CVA (cerebrovascular accident)   Hx of seizure disorder  Total Time spent with patient: 26  minutes.  Past Psychiatric History: Past history of multiple visits for treatment for these symptoms of paranoia  Past Medical History:  Past Medical History:  Diagnosis Date   Anemia    Cancer ([place]) 2018   br cancer on left with lump and rad tx    Diabetes mellitus without complication ([place])    Family history of colonic polyps    H/O: CVA (cerebrovascular accident) 2012   a. cerebral edema, craniotomy, residual right upper & lower limb weakness   Heart murmur    High cholesterol    History of colon polyps    History of echocardiogram    a. 12/01/2013: EF 50-55%, nl global LV systolic function, mild MR, mildly increased LV posterior wall thickness   History of kidney stones    History of stress test    a. 12/01/2013: no significant ischemia, no significant WMA, no EKG changes concerning for ischemia, EF 67%, low risk scan   Hx of seizure disorder    a. one episode 10 months after CVA, since then on Keppra    Hypothyroidism    Personal  history of radiation therapy 2019   LEFT lumpectomy   Seizures ([place])    Stroke ([place]) 2012   Thyroid disease     Past Surgical History:  Procedure Laterality Date   BRAIN SURGERY     decompression after stroke   BREAST BIOPSY Left 12/13/2016   path pending   BREAST LUMPECTOMY Left 01/06/2017   DUCTAL CARCINOMA IN SITU, NUCLEAR GRADE 3.   BREAST LUMPECTOMY WITH NEEDLE LOCALIZATION Left 01/06/2017   Procedure: BREAST LUMPECTOMY WITH NEEDLE LOCALIZATION;  Surgeon: Leonie Green, MD;  Location: [facility] ORS;  Service: General;  Laterality: Left;   COLONOSCOPY WITH PROPOFOL N/A 11/18/2014   Procedure: COLONOSCOPY WITH PROPOFOL;  Surgeon: Josefine Class, MD;  Location: [facility] ENDOSCOPY;  Service: Endoscopy;  Laterality: N/A;   COLONOSCOPY WITH PROPOFOL N/A 08/19/2020   Procedure: COLONOSCOPY WITH PROPOFOL;  Surgeon: Lesly Rubenstein, MD;  Location: [facility] ENDOSCOPY;  Service: Endoscopy;  Laterality: N/A;   CYSTOSCOPY/RETROGRADE/URETEROSCOPY/STONE EXTRACTION WITH BASKET Right 10/29/2014   Procedure: CYSTOSCOPY/URETEROSCOPY/STONE EXTRACTION WITH BASKET;  Surgeon: Franchot Gallo, MD;  Location: [facility] ORS;  Service: Urology;  Laterality: Right;   ESOPHAGOGASTRODUODENOSCOPY (EGD) WITH PROPOFOL N/A 11/18/2014   Procedure: ESOPHAGOGASTRODUODENOSCOPY (EGD) WITH PROPOFOL;  Surgeon: Josefine Class, MD;  Location: [facility] ENDOSCOPY;  Service: Endoscopy;  Laterality: N/A;   RE-EXCISION OF BREAST LUMPECTOMY Left 01/25/2017   Procedure: RE-EXCISION OF BREAST LUMPECTOMY;  Surgeon:  Leonie Green, MD;  Location: [facility] ORS;  Service: General;  Laterality: Left;   SUBMANDIBULAR GLAND EXCISION Right 12/15/2017   Procedure: EXCISION SUBMANDIBULAR GLAND;  Surgeon: Carloyn Manner, MD;  Location: [facility] ORS;  Service: ENT;  Laterality: Right;   TONSILLECTOMY     Family History:  Family History  Problem Relation Age of Onset   Nephrolithiasis Mother    Non-Hodgkin's lymphoma Mother 70        currently 31   Heart disease Father    CVA Father    Nephrolithiasis Maternal Grandfather    Diabetes Maternal Grandfather    Pancreatic cancer Maternal Grandfather 83       deceased 54   Breast cancer Sister 48       negative genetic testing; currently 103   Lymphoma Maternal Grandmother        deceased 48   Breast cancer Paternal Aunt        4 more paternal aunts with breast cancer dx 60s-70s   Breast cancer Cousin        2 paternal cousins; daughters of pat aunt w/ BC at 1   Colon cancer Cousin 78       son of mat aunt   Breast cancer Paternal Aunt 65       deceased 85 of MI   Family Psychiatric  History: See previous Social History:  Social History   Substance and Sexual Activity  Alcohol Use No     Social History   Substance and Sexual Activity  Drug Use No    Social History   Socioeconomic History   Marital status: Divorced    Spouse name: Not on file   Number of children: Not on file   Years of education: Not on file   Highest education level: Not on file  Occupational History   Not on file  Tobacco Use   Smoking status: Former    Packs/day: 1.00    Years: 15.00    Pack years: 15.00    Types: Cigarettes    Quit date: 12/30/2011    Years since quitting: 9.1   Smokeless tobacco: Never  Vaping Use   Vaping Use: Never used  Substance and Sexual Activity   Alcohol use: No   Drug use: No   Sexual activity: Never  Other Topics Concern   Not on file  Social History Narrative   Not on file   Social Determinants of Health   Financial Resource Strain: Not on file  Food Insecurity: Not on file  Transportation Needs: Not on file  Physical Activity: Not on file  Stress: Not on file  Social Connections: Not on file   Additional Social History:                         Sleep: Fair  Appetite:  Fair  Current Medications: Current Facility-Administered Medications  Medication Dose Route Frequency Provider Last Rate Last Admin    acetaminophen (TYLENOL) tablet 650 mg  650 mg Oral Q6H PRN Clapacs, John T, MD   650 mg at 03/04/21 2019   alum & mag hydroxide-simeth (MAALOX/MYLANTA) 200-200-20 MG/5ML suspension 30 mL  30 mL Oral Q4H PRN Clapacs, Madie Reno, MD       aspirin EC tablet 81 mg  81 mg Oral Daily Clapacs, John T, MD   81 mg at 03/07/21 0840   atorvastatin (LIPITOR) tablet 40 mg  40 mg Oral Daily Clapacs, Madie Reno, MD  40 mg at 03/06/21 2010   levETIRAcetam (KEPPRA) tablet 500 mg  500 mg Oral BID Clapacs, Madie Reno, MD   500 mg at 03/07/21 0840   levothyroxine (SYNTHROID) tablet 100 mcg  100 mcg Oral Q0600 Clapacs, Madie Reno, MD   100 mcg at 03/07/21 3428   losartan (COZAAR) tablet 50 mg  50 mg Oral Daily Clapacs, John T, MD   50 mg at 03/07/21 0840   magnesium hydroxide (MILK OF MAGNESIA) suspension 30 mL  30 mL Oral Daily PRN Clapacs, John T, MD       metFORMIN (GLUCOPHAGE) tablet 500 mg  500 mg Oral BID WC Clapacs, John T, MD   500 mg at 03/07/21 0840   risperiDONE (RISPERDAL) tablet 2 mg  2 mg Oral QHS Clapacs, Madie Reno, MD   2 mg at 03/06/21 2054    Lab Results: No results found for this or any previous visit (from the past 48 hour(s)).  Blood Alcohol level:  Lab Results  Component Value Date   ETH <10 03/02/2021   ETH <10 76/81/1572    Metabolic Disorder Labs: Lab Results  Component Value Date   HGBA1C 6.8 (H) 02/25/2021   MPG 148.46 02/25/2021   No results found for: PROLACTIN Lab Results  Component Value Date   CHOL 120 02/25/2021   TRIG 76 02/25/2021   HDL 43 02/25/2021   CHOLHDL 2.8 02/25/2021   VLDL 15 02/25/2021   LDLCALC 62 02/25/2021   LDLCALC 94 12/01/2013    Physical Findings: AIMS:  , ,  ,  ,    CIWA:    COWS:     Musculoskeletal: Strength & Muscle Tone: within normal limits Gait & Station: normal Patient leans: N/A  Psychiatric Specialty Exam:  Presentation  General Appearance: Bizarre  Eye Contact:Good  Speech:Other (comment)  Speech  Volume:Decreased  Handedness:Right   Mood and Affect  Mood:good Affect: bright  Thought Process  Thought Processes:Disorganized  Descriptions of Associations:Loose  Orientation:Partial  Thought Content:Illogical; Rumination  History of Schizophrenia/Schizoaffective disorder:No  Duration of Psychotic Symptoms:Greater than six months  Hallucinations:No data recorded Ideas of Reference:Paranoia  Suicidal Thoughts:No data recorded Homicidal Thoughts:No data recorded  Sensorium  Memory:Immediate Fair; Recent Fair; Remote Fair  Judgment:Poor  Insight:Poor   Executive Functions  Concentration:Fair  Attention Span:Fair  [place]   Psychomotor Activity  Psychomotor Activity:No data recorded  Assets  Assets:Communication Skills; Desire for Improvement; Social Support; Physical Health; Housing   Sleep  Sleep:No data recorded  Review of Systems  Constitutional: Negative.   HENT: Negative.    Eyes: Negative.   Respiratory: Negative.    Cardiovascular: Negative.   Gastrointestinal: Negative.   Musculoskeletal: Negative.   Skin: Negative.   Neurological: Negative.   Psychiatric/Behavioral: Negative.    Blood pressure (!) 153/95, pulse (!) 106, temperature 98.2 F (36.8 C), temperature source Oral, resp. rate 18, height 5\' 4"  (1.626 m), weight 88 kg, SpO2 97 %. Body mass index is 33.3 kg/m.   Treatment Plan Summary: Medication management and Plan patient was started on Risperdal last night.  Tolerated without any complaints of side effects.  No evidence stiffness or akathisia.  Denying symptoms today.  Patient seems to be calm and has not shown any agitation.  Continue current medication plan given at least another day or 2 to see that she is staying stable.  Patient has no argument with that  1/28 No changes  Rulon Sera, MD 03/07/2021, 8:57 AM

## 2021-03-07 NOTE — Progress Notes (Signed)
D: Pt alert and oriented. Pt denies experiencing anxiety/depression at this time. Pt denies experiencing any pain at this time. Pt denies experiencing any SI/HI, or AVH at this time.   A: Scheduled medications administered to pt, per MD orders. Support and encouragement provided. Frequent verbal contact made. Routine safety checks conducted q15 minutes.   R: No adverse drug reactions noted. Pt verbally contracts for safety at this time. Pt complaint with medications. Pt interacts well with others on the unit. Pt remains safe at this time. Will continue to monitor.

## 2021-03-07 NOTE — Progress Notes (Signed)
Patient denies SI, HI and AVH. Requested medication five minutes early so she could go to bed. Has been calm and cooperative and voiced no complaints

## 2021-03-08 MED ORDER — TROLAMINE SALICYLATE 10 % EX CREA
TOPICAL_CREAM | Freq: Two times a day (BID) | CUTANEOUS | Status: DC | PRN
  Filled 2021-03-08 (×2): qty 85

## 2021-03-08 NOTE — Progress Notes (Signed)
This Probation officer has been informed this evening that this pt has not eaten any of her meals today.

## 2021-03-08 NOTE — Progress Notes (Signed)
Calm, cooperative and med compliant. Denies SI, HI and AVH. Speech is clear and coherent

## 2021-03-08 NOTE — Plan of Care (Signed)
  Problem: Education: Goal: Knowledge of [facility] Education information/materials will improve Outcome: Progressing Goal: Emotional status will improve Outcome: Progressing Goal: Mental status will improve Outcome: Progressing Goal: Verbalization of understanding the information provided will improve Outcome: Progressing   Problem: Activity: Goal: Interest or engagement in activities will improve Outcome: Progressing Goal: Sleeping patterns will improve Outcome: Progressing   Problem: Coping: Goal: Ability to verbalize frustrations and anger appropriately will improve Outcome: Progressing Goal: Ability to demonstrate self-control will improve Outcome: Progressing   Problem: Health Behavior/Discharge Planning: Goal: Identification of resources available to assist in meeting health care needs will improve Outcome: Progressing Goal: Compliance with treatment plan for underlying cause of condition will improve Outcome: Progressing   Problem: Physical Regulation: Goal: Ability to maintain clinical measurements within normal limits will improve Outcome: Progressing   Problem: Safety: Goal: Periods of time without injury will increase Outcome: Progressing   

## 2021-03-08 NOTE — Group Note (Signed)
LCSW Group Therapy Note  Group Date: 03/08/2021 Start Time: 1324 End Time: 1400   Type of Therapy and Topic:  Group Therapy - How To Cope with Nervousness about Discharge   Participation Level:  Did Not Attend   Description of Group This process group involved identification of patients' feelings about discharge. Some of them are scheduled to be discharged soon, while others are new admissions, but each of them was asked to share thoughts and feelings surrounding discharge from the hospital. One common theme was that they are excited at the prospect of going home, while another was that many of them are apprehensive about sharing why they were hospitalized. Patients were given the opportunity to discuss these feelings with their peers in preparation for discharge.  Therapeutic Goals  Patient will identify their overall feelings about pending discharge. Patient will think about how they might proactively address issues that they believe will once again arise once they get home (i.e. with parents). Patients will participate in discussion about having hope for change.   Summary of Patient Progress:  Patient did not attend group despite encouraged participation.   Therapeutic Modalities Cognitive Behavioral Therapy   Berniece Salines, [place] 03/08/2021  2:09 PM

## 2021-03-08 NOTE — Progress Notes (Addendum)
[facility] MD Progress Note  03/08/2021 8:12 AM Melissa Day  MRN:  [id number] Subjective:  1/29 Patient indicates that she is feeling fine.  Indicates that she was hearing voices on the ceiling last night and believed that it was her mother apparently trying to heal her right ankle that she injured last week.  Denies other examples of hallucinations.   Denies suicidal or homicidal ideations.  Denies any other stressors.  Sleep and appetite are intact.  1/28 Patient is a mild-mannered 51 year old female who was initially seen for psychiatric evaluation by Dr. Weber Cooks on March 04, 2021.  Has a history of a stroke 10 years ago.  Has a primary diagnosis of organic psychosis.  Today, patient tells me that she is feeling pretty well.  Denies confusion hallucinations or delusions.  She is eating and sleeping well.  Smiles easily.Patient tells me that she is feeling better because she has "peace and quiet here." She still wonders if her neighbors will still mess with her after she leaves here.  Last night, she heard squeaking from the ceiling.  "I do not know I just talk back to them."  Does not elaborate further.  Principal Problem: Organic psychosis Diagnosis: Principal Problem:   Organic psychosis Active Problems:   H/O: CVA (cerebrovascular accident)   Hx of seizure disorder  Total Time spent with patient: 26  minutes.  Past Psychiatric History: Past history of multiple visits for treatment for these symptoms of paranoia  Past Medical History:  Past Medical History:  Diagnosis Date   Anemia    Cancer ([place]) 2018   br cancer on left with lump and rad tx    Diabetes mellitus without complication ([place])    Family history of colonic polyps    H/O: CVA (cerebrovascular accident) 2012   a. cerebral edema, craniotomy, residual right upper & lower limb weakness   Heart murmur    High cholesterol    History of colon polyps    History of echocardiogram    a. 12/01/2013: EF 50-55%, nl global LV  systolic function, mild MR, mildly increased LV posterior wall thickness   History of kidney stones    History of stress test    a. 12/01/2013: no significant ischemia, no significant WMA, no EKG changes concerning for ischemia, EF 67%, low risk scan   Hx of seizure disorder    a. one episode 10 months after CVA, since then on Keppra    Hypothyroidism    Personal history of radiation therapy 2019   LEFT lumpectomy   Seizures ([place])    Stroke ([place]) 2012   Thyroid disease     Past Surgical History:  Procedure Laterality Date   BRAIN SURGERY     decompression after stroke   BREAST BIOPSY Left 12/13/2016   path pending   BREAST LUMPECTOMY Left 01/06/2017   DUCTAL CARCINOMA IN SITU, NUCLEAR GRADE 3.   BREAST LUMPECTOMY WITH NEEDLE LOCALIZATION Left 01/06/2017   Procedure: BREAST LUMPECTOMY WITH NEEDLE LOCALIZATION;  Surgeon: Leonie Green, MD;  Location: [facility] ORS;  Service: General;  Laterality: Left;   COLONOSCOPY WITH PROPOFOL N/A 11/18/2014   Procedure: COLONOSCOPY WITH PROPOFOL;  Surgeon: Josefine Class, MD;  Location: [facility] ENDOSCOPY;  Service: Endoscopy;  Laterality: N/A;   COLONOSCOPY WITH PROPOFOL N/A 08/19/2020   Procedure: COLONOSCOPY WITH PROPOFOL;  Surgeon: Lesly Rubenstein, MD;  Location: [facility] ENDOSCOPY;  Service: Endoscopy;  Laterality: N/A;   CYSTOSCOPY/RETROGRADE/URETEROSCOPY/STONE EXTRACTION WITH BASKET Right 10/29/2014   Procedure: CYSTOSCOPY/URETEROSCOPY/STONE EXTRACTION  WITH BASKET;  Surgeon: Franchot Gallo, MD;  Location: [facility] ORS;  Service: Urology;  Laterality: Right;   ESOPHAGOGASTRODUODENOSCOPY (EGD) WITH PROPOFOL N/A 11/18/2014   Procedure: ESOPHAGOGASTRODUODENOSCOPY (EGD) WITH PROPOFOL;  Surgeon: Josefine Class, MD;  Location: [facility] ENDOSCOPY;  Service: Endoscopy;  Laterality: N/A;   RE-EXCISION OF BREAST LUMPECTOMY Left 01/25/2017   Procedure: RE-EXCISION OF BREAST LUMPECTOMY;  Surgeon: Leonie Green, MD;  Location: [facility] ORS;  Service:  General;  Laterality: Left;   SUBMANDIBULAR GLAND EXCISION Right 12/15/2017   Procedure: EXCISION SUBMANDIBULAR GLAND;  Surgeon: Carloyn Manner, MD;  Location: [facility] ORS;  Service: ENT;  Laterality: Right;   TONSILLECTOMY     Family History:  Family History  Problem Relation Age of Onset   Nephrolithiasis Mother    Non-Hodgkin's lymphoma Mother 2       currently 59   Heart disease Father    CVA Father    Nephrolithiasis Maternal Grandfather    Diabetes Maternal Grandfather    Pancreatic cancer Maternal Grandfather 25       deceased 65   Breast cancer Sister 61       negative genetic testing; currently 88   Lymphoma Maternal Grandmother        deceased 51   Breast cancer Paternal Aunt        4 more paternal aunts with breast cancer dx 60s-70s   Breast cancer Cousin        2 paternal cousins; daughters of pat aunt w/ BC at 80   Colon cancer Cousin 59       son of mat aunt   Breast cancer Paternal Aunt 19       deceased 16 of MI   Family Psychiatric  History: See previous Social History:  Social History   Substance and Sexual Activity  Alcohol Use No     Social History   Substance and Sexual Activity  Drug Use No    Social History   Socioeconomic History   Marital status: Divorced    Spouse name: Not on file   Number of children: Not on file   Years of education: Not on file   Highest education level: Not on file  Occupational History   Not on file  Tobacco Use   Smoking status: Former    Packs/day: 1.00    Years: 15.00    Pack years: 15.00    Types: Cigarettes    Quit date: 12/30/2011    Years since quitting: 9.1   Smokeless tobacco: Never  Vaping Use   Vaping Use: Never used  Substance and Sexual Activity   Alcohol use: No   Drug use: No   Sexual activity: Never  Other Topics Concern   Not on file  Social History Narrative   Not on file   Social Determinants of Health   Financial Resource Strain: Not on file  Food Insecurity: Not on file   Transportation Needs: Not on file  Physical Activity: Not on file  Stress: Not on file  Social Connections: Not on file   Additional Social History:                         Sleep: Fair  Appetite:  Fair  Current Medications: Current Facility-Administered Medications  Medication Dose Route Frequency Provider Last Rate Last Admin   acetaminophen (TYLENOL) tablet 650 mg  650 mg Oral Q6H PRN Clapacs, Madie Reno, MD   650 mg at 03/08/21 [phone]  alum & mag hydroxide-simeth (MAALOX/MYLANTA) 200-200-20 MG/5ML suspension 30 mL  30 mL Oral Q4H PRN Clapacs, John T, MD       aspirin EC tablet 81 mg  81 mg Oral Daily Clapacs, Madie Reno, MD   81 mg at 03/08/21 0803   atorvastatin (LIPITOR) tablet 40 mg  40 mg Oral Daily Clapacs, John T, MD   40 mg at 03/07/21 2005   levETIRAcetam (KEPPRA) tablet 500 mg  500 mg Oral BID Clapacs, Madie Reno, MD   500 mg at 03/08/21 7253   levothyroxine (SYNTHROID) tablet 100 mcg  100 mcg Oral Q0600 Clapacs, John T, MD   100 mcg at 03/08/21 0630   losartan (COZAAR) tablet 50 mg  50 mg Oral Daily Clapacs, John T, MD   50 mg at 03/08/21 0804   magnesium hydroxide (MILK OF MAGNESIA) suspension 30 mL  30 mL Oral Daily PRN Clapacs, John T, MD       metFORMIN (GLUCOPHAGE) tablet 500 mg  500 mg Oral BID WC Clapacs, John T, MD   500 mg at 03/08/21 0804   risperiDONE (RISPERDAL) tablet 2 mg  2 mg Oral QHS Clapacs, Madie Reno, MD   2 mg at 03/07/21 2055    Lab Results: No results found for this or any previous visit (from the past 48 hour(s)).  Blood Alcohol level:  Lab Results  Component Value Date   ETH <10 03/02/2021   ETH <10 66/44/0347    Metabolic Disorder Labs: Lab Results  Component Value Date   HGBA1C 6.8 (H) 02/25/2021   MPG 148.46 02/25/2021   No results found for: PROLACTIN Lab Results  Component Value Date   CHOL 120 02/25/2021   TRIG 76 02/25/2021   HDL 43 02/25/2021   CHOLHDL 2.8 02/25/2021   VLDL 15 02/25/2021   LDLCALC 62 02/25/2021   LDLCALC 94  12/01/2013    Physical Findings: AIMS:  , ,  ,  ,    CIWA:    COWS:     Musculoskeletal: Strength & Muscle Tone: within normal limits Gait & Station: normal Patient leans: N/A  Psychiatric Specialty Exam:  Presentation  General Appearance: Bizarre  Eye Contact:Good  Speech:Other (comment)  Speech Volume:Decreased  Handedness:Right   Mood and Affect  Mood: Okay Affect: Neutral  Thought Process  Thought Processes:Disorganized  Descriptions of Associations:Loose  Orientation:Partial  Thought Content:Illogical; Rumination  History of Schizophrenia/Schizoaffective disorder:No  Duration of Psychotic Symptoms:Greater than six months  Hallucinations:No data recorded Ideas of Reference:Paranoia  Suicidal Thoughts:No data recorded Homicidal Thoughts:No data recorded  Sensorium  Memory:Immediate Fair; Recent Fair; Remote Fair  Judgment:Poor  Insight:Poor   Executive Functions  Concentration:Fair  Attention Span:Fair  [place]   Psychomotor Activity  Psychomotor Activity:No data recorded  Assets  Assets:Communication Skills; Desire for Improvement; Social Support; Physical Health; Housing   Sleep  Sleep:No data recorded  Review of Systems  Constitutional: Negative.   HENT: Negative.    Eyes: Negative.   Respiratory: Negative.    Cardiovascular: Negative.   Gastrointestinal: Negative.   Musculoskeletal: Negative.   Skin: Negative.   Neurological: Negative.   Psychiatric/Behavioral: Negative.    Blood pressure (!) 146/94, pulse (!) 105, temperature 97.9 F (36.6 C), temperature source Oral, resp. rate 18, height 5\' 4"  (1.626 m), weight 88 kg, SpO2 93 %. Body mass index is 33.3 kg/m.   Treatment Plan Summary: Medication management and Plan patient was started on Risperdal last night.  Tolerated without any  complaints of side effects.  No evidence stiffness or akathisia.  Denying symptoms today.   Patient seems to be calm and has not shown any agitation.  Continue current medication plan given at least another day or 2 to see that she is staying stable.  Patient has no argument with that  1/28 No changes  1/29 Add as needed Aspercreme  Rulon Sera, MD 03/08/2021, 8:12 AM

## 2021-03-08 NOTE — Progress Notes (Signed)
D: Pt alert and oriented. Pt denies experiencing any anxiety/depression at this time. Pt reports experiencing 10/10 Right foot pain described as pins/needles at this time. Pt received prn medication for pain. Pt denies experiencing any SI/HI, or AVH at this time.   A: Scheduled medications administered to pt, per MD orders. Support and encouragement provided. Frequent verbal contact made. Routine safety checks conducted q15 minutes.   R: No adverse drug reactions noted. Pt verbally contracts for safety at this time. Pt complaint with medications and treatment plan. Pt interacts well with others on the unit. Pt remains safe at this time. Will continue to monitor.

## 2021-03-09 NOTE — Progress Notes (Signed)
D: Pt alert and oriented. Pt denies experiencing any anxiety/depression at this time. Pt denies experiencing any pain at this time. Pt denies experiencing any SI/HI, or AVH at this time.    A: Scheduled medications administered to pt, per MD orders. Support and encouragement provided. Frequent verbal contact made. Routine safety checks conducted q15 minutes.   R: No adverse drug reactions noted. Pt verbally contracts for safety at this time. Pt complaint with medications. Pt interacts minimally with others on the unit. Pt remains safe at this time. Will continue to monitor.  

## 2021-03-09 NOTE — Progress Notes (Signed)
Recreation Therapy Notes   Date: 03/09/2021  Time: 10:30 am    Location: Craft room      Behavioral response: N/A   Intervention Topic: Time Management   Discussion/Intervention: Patient refused to attend group.   Clinical Observations/Feedback:  Patient refused to attend group.    Toinette Lackie LRT/CTRS         Dorita Rowlands 03/09/2021 12:32 PM

## 2021-03-09 NOTE — Group Note (Signed)
[facility] LCSW Group Therapy Note    Group Date: 03/09/2021 Start Time: 1300 End Time: 1400  Type of Therapy and Topic:  Group Therapy:  Overcoming Obstacles  Participation Level:  [facility] PARTICIPATION LEVEL: Did Not Attend  Mood:  Description of Group:   In this group patients will be encouraged to explore what they see as obstacles to their own wellness and recovery. They will be guided to discuss their thoughts, feelings, and behaviors related to these obstacles. The group will process together ways to cope with barriers, with attention given to specific choices patients can make. Each patient will be challenged to identify changes they are motivated to make in order to overcome their obstacles. This group will be process-oriented, with patients participating in exploration of their own experiences as well as giving and receiving support and challenge from other group members.  Therapeutic Goals: 1. Patient will identify personal and current obstacles as they relate to admission. 2. Patient will identify barriers that currently interfere with their wellness or overcoming obstacles.  3. Patient will identify feelings, thought process and behaviors related to these barriers. 4. Patient will identify two changes they are willing to make to overcome these obstacles:    Summary of Patient Progress   X   Therapeutic Modalities:   Cognitive Behavioral Therapy Solution Focused Therapy Motivational Interviewing Relapse Prevention Therapy   Rozann Lesches, LCSW

## 2021-03-09 NOTE — Plan of Care (Signed)
  Problem: Education: Goal: Knowledge of [facility] Education information/materials will improve Outcome: Progressing Goal: Emotional status will improve Outcome: Progressing Goal: Mental status will improve Outcome: Progressing Goal: Verbalization of understanding the information provided will improve Outcome: Progressing   Problem: Activity: Goal: Interest or engagement in activities will improve Outcome: Progressing Goal: Sleeping patterns will improve Outcome: Progressing   Problem: Coping: Goal: Ability to verbalize frustrations and anger appropriately will improve Outcome: Progressing Goal: Ability to demonstrate self-control will improve Outcome: Progressing   Problem: Health Behavior/Discharge Planning: Goal: Identification of resources available to assist in meeting health care needs will improve Outcome: Progressing Goal: Compliance with treatment plan for underlying cause of condition will improve Outcome: Progressing   Problem: Physical Regulation: Goal: Ability to maintain clinical measurements within normal limits will improve Outcome: Progressing   Problem: Safety: Goal: Periods of time without injury will increase Outcome: Progressing   

## 2021-03-09 NOTE — BH IP Treatment Plan (Signed)
Interdisciplinary Treatment and Diagnostic Plan Update  03/09/2021 Time of Session: 0830 Melissa Day MRN: [id number]  Principal Diagnosis: Organic psychosis  Secondary Diagnoses: Principal Problem:   Organic psychosis Active Problems:   H/O: CVA (cerebrovascular accident)   Hx of seizure disorder   Current Medications:  Current Facility-Administered Medications  Medication Dose Route Frequency Provider Last Rate Last Admin   acetaminophen (TYLENOL) tablet 650 mg  650 mg Oral Q6H PRN Clapacs, John T, MD   650 mg at 03/08/21 0809   alum & mag hydroxide-simeth (MAALOX/MYLANTA) 200-200-20 MG/5ML suspension 30 mL  30 mL Oral Q4H PRN Clapacs, Madie Reno, MD       aspirin EC tablet 81 mg  81 mg Oral Daily Clapacs, John T, MD   81 mg at 03/09/21 0810   atorvastatin (LIPITOR) tablet 40 mg  40 mg Oral Daily Clapacs, John T, MD   40 mg at 03/08/21 1950   levETIRAcetam (KEPPRA) tablet 500 mg  500 mg Oral BID Clapacs, John T, MD   500 mg at 03/09/21 0810   levothyroxine (SYNTHROID) tablet 100 mcg  100 mcg Oral Q0600 Clapacs, John T, MD   100 mcg at 03/09/21 0737   losartan (COZAAR) tablet 50 mg  50 mg Oral Daily Clapacs, John T, MD   50 mg at 03/09/21 0810   magnesium hydroxide (MILK OF MAGNESIA) suspension 30 mL  30 mL Oral Daily PRN Clapacs, John T, MD       metFORMIN (GLUCOPHAGE) tablet 500 mg  500 mg Oral BID WC Clapacs, John T, MD   500 mg at 03/09/21 0810   risperiDONE (RISPERDAL) tablet 2 mg  2 mg Oral QHS Clapacs, John T, MD   2 mg at 03/08/21 1062   trolamine salicylate (ASPERCREME) 10 % cream   Topical BID PRN Rulon Sera, MD       PTA Medications: Medications Prior to Admission  Medication Sig Dispense Refill Last Dose   aspirin EC 81 MG tablet Take 81 mg by mouth daily.      atorvastatin (LIPITOR) 40 MG tablet Take 1 tablet (40 mg total) by mouth daily. 30 tablet 3    baclofen (LIORESAL) 10 MG tablet Take 5 mg by mouth 2 (two) times daily.      FLUoxetine (PROZAC) 40 MG capsule  Take 40 mg by mouth daily.      levETIRAcetam (KEPPRA) 500 MG tablet Take 1 tablet (500 mg total) by mouth 2 (two) times daily. 60 tablet 1    levothyroxine (SYNTHROID) 100 MCG tablet Take 1 tablet (100 mcg total) by mouth daily before breakfast. 30 tablet 1    losartan (COZAAR) 50 MG tablet Take 50 mg by mouth daily.      metFORMIN (GLUCOPHAGE) 500 MG tablet Take 500 mg by mouth daily with breakfast.      risperiDONE (RISPERDAL) 0.5 MG tablet Take 1 tablet (0.5 mg total) by mouth at bedtime. 30 tablet 1     Patient Stressors: Medication change or noncompliance    Patient Strengths: Motivation for treatment/growth   Treatment Modalities: Medication Management, Group therapy, Case management,  1 to 1 session with clinician, Psychoeducation, Recreational therapy.   Physician Treatment Plan for Primary Diagnosis: Organic psychosis Long Term Goal(s): Improvement in symptoms so as ready for discharge   Short Term Goals: Ability to maintain clinical measurements within normal limits will improve Compliance with prescribed medications will improve Ability to verbalize feelings will improve Ability to demonstrate self-control will improve  Medication Management:  Evaluate patient's response, side effects, and tolerance of medication regimen.  Therapeutic Interventions: 1 to 1 sessions, Unit Group sessions and Medication administration.  Evaluation of Outcomes: Not Met  Physician Treatment Plan for Secondary Diagnosis: Principal Problem:   Organic psychosis Active Problems:   H/O: CVA (cerebrovascular accident)   Hx of seizure disorder  Long Term Goal(s): Improvement in symptoms so as ready for discharge   Short Term Goals: Ability to maintain clinical measurements within normal limits will improve Compliance with prescribed medications will improve Ability to verbalize feelings will improve Ability to demonstrate self-control will improve     Medication Management: Evaluate  patient's response, side effects, and tolerance of medication regimen.  Therapeutic Interventions: 1 to 1 sessions, Unit Group sessions and Medication administration.  Evaluation of Outcomes: Not Met   RN Treatment Plan for Primary Diagnosis: Organic psychosis Long Term Goal(s): Knowledge of disease and therapeutic regimen to maintain health will improve  Short Term Goals: Ability to remain free from injury will improve, Ability to verbalize frustration and anger appropriately will improve, Ability to demonstrate self-control, Ability to participate in decision making will improve, Ability to verbalize feelings will improve, Ability to disclose and discuss suicidal ideas, Ability to identify and develop effective coping behaviors will improve, and Compliance with prescribed medications will improve  Medication Management: RN will administer medications as ordered by provider, will assess and evaluate patient's response and provide education to patient for prescribed medication. RN will report any adverse and/or side effects to prescribing provider.  Therapeutic Interventions: 1 on 1 counseling sessions, Psychoeducation, Medication administration, Evaluate responses to treatment, Monitor vital signs and CBGs as ordered, Perform/monitor CIWA, COWS, AIMS and Fall Risk screenings as ordered, Perform wound care treatments as ordered.  Evaluation of Outcomes: Not Met   LCSW Treatment Plan for Primary Diagnosis: Organic psychosis Long Term Goal(s): Safe transition to appropriate next level of care at discharge, Engage patient in therapeutic group addressing interpersonal concerns.  Short Term Goals: Engage patient in aftercare planning with referrals and resources, Increase social support, Increase ability to appropriately verbalize feelings, Increase emotional regulation, Facilitate acceptance of mental health diagnosis and concerns, Facilitate patient progression through stages of change regarding  substance use diagnoses and concerns, Identify triggers associated with mental health/substance abuse issues, and Increase skills for wellness and recovery  Therapeutic Interventions: Assess for all discharge needs, 1 to 1 time with Social worker, Explore available resources and support systems, Assess for adequacy in community support network, Educate family and significant other(s) on suicide prevention, Complete Psychosocial Assessment, Interpersonal group therapy.  Evaluation of Outcomes: Not Met   Progress in Treatment: Attending groups: No. Participating in groups: No. Taking medication as prescribed: Yes. Toleration medication: Yes. Family/Significant other contact made: Yes, individual(s) contacted:  SPE completed with Ellyn Hack, mother.  Patient understands diagnosis: No. Discussing patient identified problems/goals with staff: Yes. Medical problems stabilized or resolved: Yes. Denies suicidal/homicidal ideation: Yes. Issues/concerns per patient self-inventory: Yes. Other: none   New problem(s) identified: Yes, Describe:  Patient not forwarding pertinent information to medical staff, however, patient's mother reports that patient continues to be delusional during phone conversations.   New Short Term/Long Term Goal(s): Patient to continue working towards  elimination of symptoms of psychosis, medication management for mood stabilization, development of comprehensive mental wellness plan.  Patient Goals:  No additional goals identified at this time.   Discharge Plan or Barriers: No psychosocial barriers identified at this time, patient to return to place of residence when appropriate for  discharge.   Reason for Continuation of Hospitalization: Hallucinations Other; describe Delusions  Estimated Length of Stay: TBD    Scribe for Treatment Team: Larose Kells 03/09/2021 9:54 AM

## 2021-03-09 NOTE — Progress Notes (Signed)
[facility] MD Progress Note  03/09/2021 4:53 PM Melissa Day  MRN:  [id number] Subjective: Patient seen for follow-up.  Patient is reporting to me today having no paranoia or hallucinations.  Nursing staff reports that the patient had had some hallucinations at least 1 day over the weekend.  Symptoms and behaviors seem to be completely under control during the daytime but only get worse at night.  I spoke with the patient's mother today who reports that the patient and still had episodes of strange behavior at times in the last couple days.  No aggressive behavior.  Mother reports that the patient has complained of some blurriness of vision that could be due to Risperdal Principal Problem: Organic psychosis Diagnosis: Principal Problem:   Organic psychosis Active Problems:   H/O: CVA (cerebrovascular accident)   Hx of seizure disorder  Total Time spent with patient: 30 minutes  Past Psychiatric History: Only past history is having had a stroke and this recent onset of paranoia  Past Medical History:  Past Medical History:  Diagnosis Date   Anemia    Cancer ([place]) 2018   br cancer on left with lump and rad tx    Diabetes mellitus without complication ([place])    Family history of colonic polyps    H/O: CVA (cerebrovascular accident) 2012   a. cerebral edema, craniotomy, residual right upper & lower limb weakness   Heart murmur    High cholesterol    History of colon polyps    History of echocardiogram    a. 12/01/2013: EF 50-55%, nl global LV systolic function, mild MR, mildly increased LV posterior wall thickness   History of kidney stones    History of stress test    a. 12/01/2013: no significant ischemia, no significant WMA, no EKG changes concerning for ischemia, EF 67%, low risk scan   Hx of seizure disorder    a. one episode 10 months after CVA, since then on Keppra    Hypothyroidism    Personal history of radiation therapy 2019   LEFT lumpectomy   Seizures ([place])    Stroke ([place])  2012   Thyroid disease     Past Surgical History:  Procedure Laterality Date   BRAIN SURGERY     decompression after stroke   BREAST BIOPSY Left 12/13/2016   path pending   BREAST LUMPECTOMY Left 01/06/2017   DUCTAL CARCINOMA IN SITU, NUCLEAR GRADE 3.   BREAST LUMPECTOMY WITH NEEDLE LOCALIZATION Left 01/06/2017   Procedure: BREAST LUMPECTOMY WITH NEEDLE LOCALIZATION;  Surgeon: Leonie Green, MD;  Location: [facility] ORS;  Service: General;  Laterality: Left;   COLONOSCOPY WITH PROPOFOL N/A 11/18/2014   Procedure: COLONOSCOPY WITH PROPOFOL;  Surgeon: Josefine Class, MD;  Location: [facility] ENDOSCOPY;  Service: Endoscopy;  Laterality: N/A;   COLONOSCOPY WITH PROPOFOL N/A 08/19/2020   Procedure: COLONOSCOPY WITH PROPOFOL;  Surgeon: Lesly Rubenstein, MD;  Location: [facility] ENDOSCOPY;  Service: Endoscopy;  Laterality: N/A;   CYSTOSCOPY/RETROGRADE/URETEROSCOPY/STONE EXTRACTION WITH BASKET Right 10/29/2014   Procedure: CYSTOSCOPY/URETEROSCOPY/STONE EXTRACTION WITH BASKET;  Surgeon: Franchot Gallo, MD;  Location: [facility] ORS;  Service: Urology;  Laterality: Right;   ESOPHAGOGASTRODUODENOSCOPY (EGD) WITH PROPOFOL N/A 11/18/2014   Procedure: ESOPHAGOGASTRODUODENOSCOPY (EGD) WITH PROPOFOL;  Surgeon: Josefine Class, MD;  Location: [facility] ENDOSCOPY;  Service: Endoscopy;  Laterality: N/A;   RE-EXCISION OF BREAST LUMPECTOMY Left 01/25/2017   Procedure: RE-EXCISION OF BREAST LUMPECTOMY;  Surgeon: Leonie Green, MD;  Location: [facility] ORS;  Service: General;  Laterality: Left;   SUBMANDIBULAR  GLAND EXCISION Right 12/15/2017   Procedure: EXCISION SUBMANDIBULAR GLAND;  Surgeon: Carloyn Manner, MD;  Location: [facility] ORS;  Service: ENT;  Laterality: Right;   TONSILLECTOMY     Family History:  Family History  Problem Relation Age of Onset   Nephrolithiasis Mother    Non-Hodgkin's lymphoma Mother 77       currently 83   Heart disease Father    CVA Father    Nephrolithiasis Maternal Grandfather     Diabetes Maternal Grandfather    Pancreatic cancer Maternal Grandfather 36       deceased 22   Breast cancer Sister 71       negative genetic testing; currently 33   Lymphoma Maternal Grandmother        deceased 75   Breast cancer Paternal Aunt        4 more paternal aunts with breast cancer dx 34s-70s   Breast cancer Cousin        2 paternal cousins; daughters of pat aunt w/ BC at 25   Colon cancer Cousin 23       son of mat aunt   Breast cancer Paternal Aunt 75       deceased 16 of MI   Family Psychiatric  History: See previous Social History:  Social History   Substance and Sexual Activity  Alcohol Use No     Social History   Substance and Sexual Activity  Drug Use No    Social History   Socioeconomic History   Marital status: Divorced    Spouse name: Not on file   Number of children: Not on file   Years of education: Not on file   Highest education level: Not on file  Occupational History   Not on file  Tobacco Use   Smoking status: Former    Packs/day: 1.00    Years: 15.00    Pack years: 15.00    Types: Cigarettes    Quit date: 12/30/2011    Years since quitting: 9.1   Smokeless tobacco: Never  Vaping Use   Vaping Use: Never used  Substance and Sexual Activity   Alcohol use: No   Drug use: No   Sexual activity: Never  Other Topics Concern   Not on file  Social History Narrative   Not on file   Social Determinants of Health   Financial Resource Strain: Not on file  Food Insecurity: Not on file  Transportation Needs: Not on file  Physical Activity: Not on file  Stress: Not on file  Social Connections: Not on file   Additional Social History:                         Sleep: Fair  Appetite:  Fair  Current Medications: Current Facility-Administered Medications  Medication Dose Route Frequency Provider Last Rate Last Admin   acetaminophen (TYLENOL) tablet 650 mg  650 mg Oral Q6H PRN Oval Cavazos T, MD   650 mg at 03/08/21 0809    alum & mag hydroxide-simeth (MAALOX/MYLANTA) 200-200-20 MG/5ML suspension 30 mL  30 mL Oral Q4H PRN Jed Kutch, Madie Reno, MD       aspirin EC tablet 81 mg  81 mg Oral Daily Oneill Bais T, MD   81 mg at 03/09/21 0810   atorvastatin (LIPITOR) tablet 40 mg  40 mg Oral Daily Nyellie Yetter T, MD   40 mg at 03/08/21 1950   levETIRAcetam (KEPPRA) tablet 500 mg  500 mg Oral BID  Cesario Weidinger, Madie Reno, MD   500 mg at 03/09/21 6314   levothyroxine (SYNTHROID) tablet 100 mcg  100 mcg Oral Q0600 Aquila Menzie T, MD   100 mcg at 03/09/21 9702   losartan (COZAAR) tablet 50 mg  50 mg Oral Daily Levonne Carreras T, MD   50 mg at 03/09/21 0810   magnesium hydroxide (MILK OF MAGNESIA) suspension 30 mL  30 mL Oral Daily PRN Serene Kopf T, MD       metFORMIN (GLUCOPHAGE) tablet 500 mg  500 mg Oral BID WC Aydden Cumpian T, MD   500 mg at 03/09/21 0810   risperiDONE (RISPERDAL) tablet 2 mg  2 mg Oral QHS Haylen Shelnutt T, MD   2 mg at 03/08/21 6378   trolamine salicylate (ASPERCREME) 10 % cream   Topical BID PRN Rulon Sera, MD        Lab Results: No results found for this or any previous visit (from the past 48 hour(s)).  Blood Alcohol level:  Lab Results  Component Value Date   ETH <10 03/02/2021   ETH <10 58/85/0277    Metabolic Disorder Labs: Lab Results  Component Value Date   HGBA1C 6.8 (H) 02/25/2021   MPG 148.46 02/25/2021   No results found for: PROLACTIN Lab Results  Component Value Date   CHOL 120 02/25/2021   TRIG 76 02/25/2021   HDL 43 02/25/2021   CHOLHDL 2.8 02/25/2021   VLDL 15 02/25/2021   LDLCALC 62 02/25/2021   LDLCALC 94 12/01/2013    Physical Findings: AIMS:  , ,  ,  ,    CIWA:    COWS:     Musculoskeletal: Strength & Muscle Tone: decreased Gait & Station: broad based Patient leans: N/A  Psychiatric Specialty Exam:  Presentation  General Appearance: Bizarre  Eye Contact:Good  Speech:Other (comment)  Speech Volume:Decreased  Handedness:Right   Mood and Affect   Mood:Anxious; Depressed  Affect:Flat; Inappropriate; Depressed; Blunt   Thought Process  Thought Processes:Disorganized  Descriptions of Associations:Loose  Orientation:Partial  Thought Content:Illogical; Rumination  History of Schizophrenia/Schizoaffective disorder:No  Duration of Psychotic Symptoms:Greater than six months  Hallucinations:No data recorded Ideas of Reference:Paranoia  Suicidal Thoughts:No data recorded Homicidal Thoughts:No data recorded  Sensorium  Memory:Immediate Fair; Recent Fair; Remote Fair  Judgment:Poor  Insight:Poor   Executive Functions  Concentration:Fair  Attention Span:Fair  [place]   Psychomotor Activity  Psychomotor Activity:No data recorded  Assets  Assets:Communication Skills; Desire for Improvement; Social Support; Physical Health; Housing   Sleep  Sleep:No data recorded   Physical Exam: Physical Exam Vitals and nursing note reviewed.  Constitutional:      Appearance: Normal appearance.  HENT:     Head: Normocephalic and atraumatic.     Mouth/Throat:     Pharynx: Oropharynx is clear.  Eyes:     Pupils: Pupils are equal, round, and reactive to light.  Cardiovascular:     Rate and Rhythm: Normal rate and regular rhythm.  Pulmonary:     Effort: Pulmonary effort is normal.     Breath sounds: Normal breath sounds.  Abdominal:     General: Abdomen is flat.     Palpations: Abdomen is soft.  Musculoskeletal:        General: Normal range of motion.  Skin:    General: Skin is warm and dry.  Neurological:     Mental Status: She is alert. Mental status is at baseline.     Motor: Weakness present.  Comments: Patient has chronic expressive aphasia  Psychiatric:        Attention and Perception: Attention normal.        Mood and Affect: Mood normal.        Speech: Speech normal.        Behavior: Behavior normal.        Thought Content: Thought content normal.         Cognition and Memory: Cognition normal.   Review of Systems  Constitutional: Negative.   HENT: Negative.    Eyes: Negative.   Respiratory: Negative.    Cardiovascular: Negative.   Gastrointestinal: Negative.   Musculoskeletal: Negative.   Skin: Negative.   Neurological:  Positive for speech change and focal weakness.  Psychiatric/Behavioral: Negative.    Blood pressure 137/90, pulse (!) 102, temperature 97.7 F (36.5 C), temperature source Oral, resp. rate 18, height 5\' 4"  (1.626 m), weight 88 kg, SpO2 99 %. Body mass index is 33.3 kg/m.   Treatment Plan Summary: Medication management and Plan patient currently on 2 mg of Risperdal at night.  No change to medicine.  May be starting to see some improvement.  Supportive therapy and psychoeducation to the patient and her mother both.  Possible discharge in 1 to 2 days  Alethia Berthold, MD 03/09/2021, 4:53 PM

## 2021-03-09 NOTE — Progress Notes (Signed)
Patient has been quiet calm and cooperative. When asked why she did not eat any meals she could not give an explanation but said she would eat a snack at bedtime. Denies SI, HI and AVH

## 2021-03-10 MED ORDER — RISPERIDONE 1 MG PO TABS
3.0000 mg | ORAL_TABLET | Freq: Every day | ORAL | Status: DC
  Administered 2021-03-10: 3 mg via ORAL
  Filled 2021-03-10: qty 3

## 2021-03-10 NOTE — Plan of Care (Signed)
Patient was mostly in the room but was calm and cooperative. Alert and oriented and denying SI/HI/AVH. Pt ate all her meals and received medications as scheduled. Encouragements and support provided.

## 2021-03-10 NOTE — Progress Notes (Addendum)
Recreation Therapy Notes  Date: 03/10/2021  Time: 10:00am   Location: Courtyard    Behavioral response: N/A   Intervention Topic: Leisure    Discussion/Intervention: Patient refused to attend group.   Clinical Observations/Feedback:  Patient refused to attend group.   Chancelor Hardrick LRT/CTRS          Edwards Mckelvie 03/10/2021 11:53 AM

## 2021-03-10 NOTE — Plan of Care (Signed)
Pt does not have any scheduled medications this shift. Pt denies SI/HI/AVH. Q15 min safety checks maintained. Will continue to monitor.  Problem: Education: Goal: Knowledge of [facility] Education information/materials will improve Outcome: Progressing Goal: Emotional status will improve Outcome: Progressing Goal: Mental status will improve Outcome: Progressing Goal: Verbalization of understanding the information provided will improve Outcome: Progressing   Problem: Activity: Goal: Interest or engagement in activities will improve Outcome: Progressing Goal: Sleeping patterns will improve Outcome: Progressing   Problem: Coping: Goal: Ability to verbalize frustrations and anger appropriately will improve Outcome: Progressing Goal: Ability to demonstrate self-control will improve Outcome: Progressing   Problem: Health Behavior/Discharge Planning: Goal: Identification of resources available to assist in meeting health care needs will improve Outcome: Progressing Goal: Compliance with treatment plan for underlying cause of condition will improve Outcome: Progressing   Problem: Physical Regulation: Goal: Ability to maintain clinical measurements within normal limits will improve Outcome: Progressing   Problem: Safety: Goal: Periods of time without injury will increase Outcome: Progressing

## 2021-03-10 NOTE — Plan of Care (Signed)
°  Problem: Group Participation Goal: STG - Patient will engage in groups without prompting or encouragement from LRT x3 group sessions within 5 recreation therapy group sessions Description: STG - Patient will engage in groups without prompting or encouragement from LRT x3 group sessions within 5 recreation therapy group sessions Outcome: Not Progressing   

## 2021-03-10 NOTE — Progress Notes (Signed)
[facility] MD Progress Note  03/10/2021 3:43 PM Melissa Day  MRN:  [id number] Subjective: Follow-up 51 year old woman having paranoia and hallucinations at night.  Patient slept okay last night.  She did not report anything psychotic as far as I can tell to staff.  On interview today she says that she is doing better but admits that she still occasionally will have hallucinations.  When I talked with her about how important it would be not to disrupt her upstairs neighbors when she went home she seemed to be skeptical still of my assertion that they were not trying to harm her.  Tolerating medicine.  No sign of stiffness or akathisia.  Still does not interact very much but takes care of her ADLs okay. Principal Problem: Organic psychosis Diagnosis: Principal Problem:   Organic psychosis Active Problems:   H/O: CVA (cerebrovascular accident)   Hx of seizure disorder  Total Time spent with patient: 30 minutes  Past Psychiatric History: This is the second time we have admitted her to our hospital for this.  Symptoms seems to be recurring once she goes home.  Past Medical History:  Past Medical History:  Diagnosis Date   Anemia    Cancer ([place]) 2018   br cancer on left with lump and rad tx    Diabetes mellitus without complication ([place])    Family history of colonic polyps    H/O: CVA (cerebrovascular accident) 2012   a. cerebral edema, craniotomy, residual right upper & lower limb weakness   Heart murmur    High cholesterol    History of colon polyps    History of echocardiogram    a. 12/01/2013: EF 50-55%, nl global LV systolic function, mild MR, mildly increased LV posterior wall thickness   History of kidney stones    History of stress test    a. 12/01/2013: no significant ischemia, no significant WMA, no EKG changes concerning for ischemia, EF 67%, low risk scan   Hx of seizure disorder    a. one episode 10 months after CVA, since then on Keppra    Hypothyroidism    Personal history  of radiation therapy 2019   LEFT lumpectomy   Seizures ([place])    Stroke ([place]) 2012   Thyroid disease     Past Surgical History:  Procedure Laterality Date   BRAIN SURGERY     decompression after stroke   BREAST BIOPSY Left 12/13/2016   path pending   BREAST LUMPECTOMY Left 01/06/2017   DUCTAL CARCINOMA IN SITU, NUCLEAR GRADE 3.   BREAST LUMPECTOMY WITH NEEDLE LOCALIZATION Left 01/06/2017   Procedure: BREAST LUMPECTOMY WITH NEEDLE LOCALIZATION;  Surgeon: Leonie Green, MD;  Location: [facility] ORS;  Service: General;  Laterality: Left;   COLONOSCOPY WITH PROPOFOL N/A 11/18/2014   Procedure: COLONOSCOPY WITH PROPOFOL;  Surgeon: Josefine Class, MD;  Location: [facility] ENDOSCOPY;  Service: Endoscopy;  Laterality: N/A;   COLONOSCOPY WITH PROPOFOL N/A 08/19/2020   Procedure: COLONOSCOPY WITH PROPOFOL;  Surgeon: Lesly Rubenstein, MD;  Location: [facility] ENDOSCOPY;  Service: Endoscopy;  Laterality: N/A;   CYSTOSCOPY/RETROGRADE/URETEROSCOPY/STONE EXTRACTION WITH BASKET Right 10/29/2014   Procedure: CYSTOSCOPY/URETEROSCOPY/STONE EXTRACTION WITH BASKET;  Surgeon: Franchot Gallo, MD;  Location: [facility] ORS;  Service: Urology;  Laterality: Right;   ESOPHAGOGASTRODUODENOSCOPY (EGD) WITH PROPOFOL N/A 11/18/2014   Procedure: ESOPHAGOGASTRODUODENOSCOPY (EGD) WITH PROPOFOL;  Surgeon: Josefine Class, MD;  Location: [facility] ENDOSCOPY;  Service: Endoscopy;  Laterality: N/A;   RE-EXCISION OF BREAST LUMPECTOMY Left 01/25/2017   Procedure: RE-EXCISION OF BREAST  LUMPECTOMY;  Surgeon: Leonie Green, MD;  Location: [facility] ORS;  Service: General;  Laterality: Left;   SUBMANDIBULAR GLAND EXCISION Right 12/15/2017   Procedure: EXCISION SUBMANDIBULAR GLAND;  Surgeon: Carloyn Manner, MD;  Location: [facility] ORS;  Service: ENT;  Laterality: Right;   TONSILLECTOMY     Family History:  Family History  Problem Relation Age of Onset   Nephrolithiasis Mother    Non-Hodgkin's lymphoma Mother 58       currently 31    Heart disease Father    CVA Father    Nephrolithiasis Maternal Grandfather    Diabetes Maternal Grandfather    Pancreatic cancer Maternal Grandfather 72       deceased 91   Breast cancer Sister 57       negative genetic testing; currently 27   Lymphoma Maternal Grandmother        deceased 66   Breast cancer Paternal Aunt        4 more paternal aunts with breast cancer dx 60s-70s   Breast cancer Cousin        2 paternal cousins; daughters of pat aunt w/ BC at 80   Colon cancer Cousin 34       son of mat aunt   Breast cancer Paternal Aunt 67       deceased 80 of MI   Family Psychiatric  History: See previous Social History:  Social History   Substance and Sexual Activity  Alcohol Use No     Social History   Substance and Sexual Activity  Drug Use No    Social History   Socioeconomic History   Marital status: Divorced    Spouse name: Not on file   Number of children: Not on file   Years of education: Not on file   Highest education level: Not on file  Occupational History   Not on file  Tobacco Use   Smoking status: Former    Packs/day: 1.00    Years: 15.00    Pack years: 15.00    Types: Cigarettes    Quit date: 12/30/2011    Years since quitting: 9.2   Smokeless tobacco: Never  Vaping Use   Vaping Use: Never used  Substance and Sexual Activity   Alcohol use: No   Drug use: No   Sexual activity: Never  Other Topics Concern   Not on file  Social History Narrative   Not on file   Social Determinants of Health   Financial Resource Strain: Not on file  Food Insecurity: Not on file  Transportation Needs: Not on file  Physical Activity: Not on file  Stress: Not on file  Social Connections: Not on file   Additional Social History:                         Sleep: Fair  Appetite:  Fair  Current Medications: Current Facility-Administered Medications  Medication Dose Route Frequency Provider Last Rate Last Admin   acetaminophen (TYLENOL)  tablet 650 mg  650 mg Oral Q6H PRN Daimen Shovlin T, MD   650 mg at 03/08/21 0809   alum & mag hydroxide-simeth (MAALOX/MYLANTA) 200-200-20 MG/5ML suspension 30 mL  30 mL Oral Q4H PRN Keniel Ralston, Madie Reno, MD       aspirin EC tablet 81 mg  81 mg Oral Daily Keliah Harned T, MD   81 mg at 03/10/21 0757   atorvastatin (LIPITOR) tablet 40 mg  40 mg Oral Daily Daejon Lich T,  MD   40 mg at 03/09/21 2118   levETIRAcetam (KEPPRA) tablet 500 mg  500 mg Oral BID Novella Abraha, Madie Reno, MD   500 mg at 03/10/21 0757   levothyroxine (SYNTHROID) tablet 100 mcg  100 mcg Oral Q0600 Rickia Freeburg T, MD   100 mcg at 03/10/21 0557   losartan (COZAAR) tablet 50 mg  50 mg Oral Daily Damiya Sandefur T, MD   50 mg at 03/10/21 0757   magnesium hydroxide (MILK OF MAGNESIA) suspension 30 mL  30 mL Oral Daily PRN Darika Ildefonso, Madie Reno, MD       metFORMIN (GLUCOPHAGE) tablet 500 mg  500 mg Oral BID WC Sydell Prowell T, MD   500 mg at 03/10/21 0757   risperiDONE (RISPERDAL) tablet 3 mg  3 mg Oral QHS Vinaya Sancho T, MD       trolamine salicylate (ASPERCREME) 10 % cream   Topical BID PRN Rulon Sera, MD        Lab Results: No results found for this or any previous visit (from the past 52 hour(s)).  Blood Alcohol level:  Lab Results  Component Value Date   ETH <10 03/02/2021   ETH <10 65/68/1275    Metabolic Disorder Labs: Lab Results  Component Value Date   HGBA1C 6.8 (H) 02/25/2021   MPG 148.46 02/25/2021   No results found for: PROLACTIN Lab Results  Component Value Date   CHOL 120 02/25/2021   TRIG 76 02/25/2021   HDL 43 02/25/2021   CHOLHDL 2.8 02/25/2021   VLDL 15 02/25/2021   LDLCALC 62 02/25/2021   LDLCALC 94 12/01/2013    Physical Findings: AIMS:  , ,  ,  ,    CIWA:    COWS:     Musculoskeletal: Strength & Muscle Tone: decreased Gait & Station: unable to stand Patient leans: N/A  Psychiatric Specialty Exam:  Presentation  General Appearance: Bizarre  Eye Contact:Good  Speech:Other  (comment)  Speech Volume:Decreased  Handedness:Right   Mood and Affect  Mood:Anxious; Depressed  Affect:Flat; Inappropriate; Depressed; Blunt   Thought Process  Thought Processes:Disorganized  Descriptions of Associations:Loose  Orientation:Partial  Thought Content:Illogical; Rumination  History of Schizophrenia/Schizoaffective disorder:No  Duration of Psychotic Symptoms:Greater than six months  Hallucinations:No data recorded Ideas of Reference:Paranoia  Suicidal Thoughts:No data recorded Homicidal Thoughts:No data recorded  Sensorium  Memory:Immediate Fair; Recent Fair; Remote Fair  Judgment:Poor  Insight:Poor   Executive Functions  Concentration:Fair  Attention Span:Fair  [place]   Psychomotor Activity  Psychomotor Activity:No data recorded  Assets  Assets:Communication Skills; Desire for Improvement; Social Support; Physical Health; Housing   Sleep  Sleep:No data recorded   Physical Exam: Physical Exam Vitals and nursing note reviewed.  Constitutional:      Appearance: Normal appearance.  HENT:     Head: Normocephalic and atraumatic.     Mouth/Throat:     Pharynx: Oropharynx is clear.  Eyes:     Pupils: Pupils are equal, round, and reactive to light.  Cardiovascular:     Rate and Rhythm: Normal rate and regular rhythm.  Pulmonary:     Effort: Pulmonary effort is normal.     Breath sounds: Normal breath sounds.  Abdominal:     General: Abdomen is flat.     Palpations: Abdomen is soft.  Musculoskeletal:        General: Normal range of motion.  Skin:    General: Skin is warm and dry.  Neurological:     General: No  focal deficit present.     Mental Status: She is alert. Mental status is at baseline.  Psychiatric:        Attention and Perception: Attention normal.        Mood and Affect: Mood normal. Affect is blunt.        Speech: She is noncommunicative.        Behavior: Behavior  is slowed.        Thought Content: Thought content is paranoid.        Cognition and Memory: Memory is impaired.   Review of Systems  Constitutional: Negative.   HENT: Negative.    Eyes: Negative.   Respiratory: Negative.    Cardiovascular: Negative.   Gastrointestinal: Negative.   Musculoskeletal: Negative.   Skin: Negative.   Neurological:  Positive for focal weakness and weakness.  Psychiatric/Behavioral:  Positive for hallucinations.   Blood pressure (!) 145/98, pulse (!) 105, temperature 97.7 F (36.5 C), temperature source Oral, resp. rate 17, height 5\' 4"  (1.626 m), weight 88 kg, SpO2 99 %. Body mass index is 33.3 kg/m.   Treatment Plan Summary: Medication management and Plan still having some hallucinations.  Of course it may take time for everything to work.  Patient however is certainly anxious for discharge and at this point no longer meets criteria for any acute dangerousness.  I am going to increase the Risperdal to 3 mg tonight and then propose we discharge her tomorrow.  Patient agrees to plan.  Coached her about the importance of staying compliant with medicine and trying not to get upset about the people at her apartment.  Alethia Berthold, MD 03/10/2021, 3:43 PM

## 2021-03-10 NOTE — Group Note (Signed)
[facility] LCSW Group Therapy Note   Group Date: 03/10/2021 Start Time: 1330 End Time: 1430  Type of Therapy/Topic:  Group Therapy:  Feelings about Diagnosis  Participation Level:  Did Not Attend    Description of Group:    This group will allow patients to explore their thoughts and feelings about diagnoses they have received. Patients will be guided to explore their level of understanding and acceptance of these diagnoses. Facilitator will encourage patients to process their thoughts and feelings about the reactions of others to their diagnosis, and will guide patients in identifying ways to discuss their diagnosis with significant others in their lives. This group will be process-oriented, with patients participating in exploration of their own experiences as well as giving and receiving support and challenge from other group members.   Therapeutic Goals: 1. Patient will demonstrate understanding of diagnosis as evidence by identifying two or more symptoms of the disorder:  2. Patient will be able to express two feelings regarding the diagnosis 3. Patient will demonstrate ability to communicate their needs through discussion and/or role plays  Summary of Patient Progress:    X    Therapeutic Modalities:   Cognitive Behavioral Therapy Brief Therapy Feelings Identification    Rozann Lesches, LCSW

## 2021-03-10 NOTE — Progress Notes (Signed)
Patient calm and cooperative during assessment denying SI/HI/AVH. Pt observed interacting appropriately with staff and peers on the unit. Pt compliant with medication administration per MD orders. Pt given education, support, and encouragement to be active in her treatment plan. Pt being monitored Q 15 minutes for safety per unit protocol. Pt remains safe on the unit.

## 2021-03-11 MED ORDER — LEVETIRACETAM 500 MG PO TABS: 500.0000 mg | ORAL_TABLET | Freq: Two times a day (BID) | ORAL | 1 refills | Status: AC

## 2021-03-11 MED ORDER — ATORVASTATIN CALCIUM 40 MG PO TABS: 40.0000 mg | ORAL_TABLET | Freq: Every day | ORAL | 1 refills | Status: DC

## 2021-03-11 MED ORDER — ASPIRIN 81 MG PO TBEC: 81.0000 mg | DELAYED_RELEASE_TABLET | Freq: Every day | ORAL | 1 refills | Status: AC

## 2021-03-11 MED ORDER — LEVOTHYROXINE SODIUM 100 MCG PO TABS: 100.0000 ug | ORAL_TABLET | Freq: Every day | ORAL | 1 refills | Status: AC

## 2021-03-11 MED ORDER — TROLAMINE SALICYLATE 10 % EX CREA: TOPICAL_CREAM | Freq: Two times a day (BID) | CUTANEOUS | 0 refills | Status: DC | PRN

## 2021-03-11 MED ORDER — LOSARTAN POTASSIUM 50 MG PO TABS: 50.0000 mg | ORAL_TABLET | Freq: Every day | ORAL | 1 refills | Status: DC

## 2021-03-11 MED ORDER — METFORMIN HCL 500 MG PO TABS: 500.0000 mg | ORAL_TABLET | Freq: Two times a day (BID) | ORAL | 1 refills | Status: AC

## 2021-03-11 MED ORDER — RISPERIDONE 3 MG PO TABS: 3.0000 mg | ORAL_TABLET | Freq: Every day | ORAL | 1 refills | Status: DC

## 2021-03-11 NOTE — Plan of Care (Signed)
Problem: Group Participation Goal: STG - Patient will engage in groups without prompting or encouragement from LRT x3 group sessions within 5 recreation therapy group sessions Description: STG - Patient will engage in groups without prompting or encouragement from LRT x3 group sessions within 5 recreation therapy group sessions 03/11/2021 1254 by Ernest Haber, LRT Outcome: Not Applicable 07/16/2895 [id number] by Ernest Haber, LRT Outcome: Not Met (add Reason) Note: Patient did not attend any groups.

## 2021-03-11 NOTE — Progress Notes (Signed)
Recreation Therapy Notes  INPATIENT RECREATION TR PLAN  Patient Details Name: AIRIANNA KREISCHER MRN: [id number] DOB: 1970-11-30 Today's Date: 03/11/2021  Rec Therapy Plan Is patient appropriate for Therapeutic Recreation?: Yes Treatment times per week: at least 3 Estimated Length of Stay: 5-7 days TR Treatment/Interventions: Group participation (Comment)  Discharge Criteria Pt will be discharged from therapy if:: Discharged Treatment plan/goals/alternatives discussed and agreed upon by:: Patient/family  Discharge Summary Short term goals set: Patient will engage in groups without prompting or encouragement from LRT x3 group sessions within 5 recreation therapy group sessions Short term goals met: Not met Reason goals not met: Patient did not attend any groups Therapeutic equipment acquired: N/A Reason patient discharged from therapy: Discharge from hospital Pt/family agrees with progress & goals achieved: Yes Date patient discharged from therapy: 03/11/21   Alannie Amodio 03/11/2021, 12:55 PM

## 2021-03-11 NOTE — Progress Notes (Signed)
D: Pt alert and oriented. Pt denies experiencing any pain, SI/HI, or AVH at this time. Pt reports she will be able to keep herself safe when she returns home.   A: Pt received discharge and medication education/information. Pt belongings were returned and signed for at this time to include printed prescriptions.   R: Pt verbalized understanding of discharge and medication education/information.  Pt escorted by staff to medical mall front lobby where pt was pick up by her mother.

## 2021-03-11 NOTE — Progress Notes (Signed)
Recreation Therapy Notes  Date: 03/11/2021  Time: 10:35 am   Location: Craft room    Behavioral response: N/A   Intervention Topic: Stress Management    Discussion/Intervention: Patient refused to attend group.   Clinical Observations/Feedback:  Patient refused to attend group.   Melissa Day LRT/CTRS        [name] 03/11/2021 12:50 PM

## 2021-03-11 NOTE — Progress Notes (Signed)
°  [facility] Adult Case Management Discharge Plan :  Will you be returning to the same living situation after discharge:  Yes,  pt reports that she is returning home. At discharge, do you have transportation home?: Yes,  pt reports that her mother will provide transportation.  Do you have the ability to pay for your medications: Yes,  [facility] Medicare  Release of information consent forms completed and in the chart;  Patient's signature needed at discharge.  Patient to Follow up at:  Follow-up Information     [facility] Follow up.   Specialty: Behavioral Health Why: Appointment is scheduled for 03/26/2021 at [place].  Please call to confirm the appointment.  They will mail you some paperwork as well. Contact information: [address], [place]. Follow up.   Why: The ACTT team will follow up with you once you are home. Contact information: [place] [address] [phone]         [facility] Follow up.   Why: The ACTT team will follow up with you once you are home. Contact information: [place] [address] [phone]                 Next level of care provider has access to [facility] and Suicide Prevention discussed: Yes,  SPE completed with the patient and her mother.      Has patient been referred to the Quitline?: Patient refused referral  Patient has been referred for addiction treatment: Pt. refused referral  Rozann Lesches, LCSW 03/11/2021, 11:21 AM

## 2021-03-11 NOTE — Progress Notes (Signed)
D: Pt alert and oriented. Pt denies experiencing any anxiety/depression at this time. Pt reports experiencing 5/10 right foot pain at this time, pt refused prn pain meds. Pt denies experiencing any SI/HI, or AVH at this time.   A: Scheduled medications administered to pt, per MD orders. Support and encouragement provided. Frequent verbal contact made. Routine safety checks conducted q15 minutes.   R: No adverse drug reactions noted. Pt verbally contracts for safety at this time. Pt complaint with medications and treatment plan. Pt interacts well with others on the unit. Pt remains safe at this time. Will continue to monitor.

## 2021-03-11 NOTE — Discharge Summary (Signed)
Physician Discharge Summary Note  Patient:  Melissa Day is an 51 y.o., female MRN:  [id number] DOB:  [birth year] Patient phone:  [phone] (home)  Patient address:   [place] [facility] [address],  Total Time spent with patient: 30 minutes  Date of Admission:  03/03/2021 Date of Discharge: 03/11/2021  Reason for Admission: Patient was admitted after presenting once again to the emergency room with concerns about paranoia and paranoid behavior and hallucinations concerning her upstairs neighbors.  Had not been actually threatening and there was no concern about suicide but had had some intrusive behaviors at home.  Principal Problem: Organic psychosis Discharge Diagnoses: Principal Problem:   Organic psychosis Active Problems:   H/O: CVA (cerebrovascular accident)   Hx of seizure disorder   Past Psychiatric History: Recent previous admission for same symptoms.  No previous psych history.  Longstanding stable chronic neurologic impairment from stroke  Past Medical History:  Past Medical History:  Diagnosis Date   Anemia    Cancer ([place]) 2018   br cancer on left with lump and rad tx    Diabetes mellitus without complication ([place])    Family history of colonic polyps    H/O: CVA (cerebrovascular accident) 2012   a. cerebral edema, craniotomy, residual right upper & lower limb weakness   Heart murmur    High cholesterol    History of colon polyps    History of echocardiogram    a. 12/01/2013: EF 50-55%, nl global LV systolic function, mild MR, mildly increased LV posterior wall thickness   History of kidney stones    History of stress test    a. 12/01/2013: no significant ischemia, no significant WMA, no EKG changes concerning for ischemia, EF 67%, low risk scan   Hx of seizure disorder    a. one episode 10 months after CVA, since then on Keppra    Hypothyroidism    Personal history of radiation therapy 2019   LEFT lumpectomy   Seizures ([place])    Stroke  ([place]) 2012   Thyroid disease     Past Surgical History:  Procedure Laterality Date   BRAIN SURGERY     decompression after stroke   BREAST BIOPSY Left 12/13/2016   path pending   BREAST LUMPECTOMY Left 01/06/2017   DUCTAL CARCINOMA IN SITU, NUCLEAR GRADE 3.   BREAST LUMPECTOMY WITH NEEDLE LOCALIZATION Left 01/06/2017   Procedure: BREAST LUMPECTOMY WITH NEEDLE LOCALIZATION;  Surgeon: Leonie Green, MD;  Location: [facility] ORS;  Service: General;  Laterality: Left;   COLONOSCOPY WITH PROPOFOL N/A 11/18/2014   Procedure: COLONOSCOPY WITH PROPOFOL;  Surgeon: Josefine Class, MD;  Location: [facility] ENDOSCOPY;  Service: Endoscopy;  Laterality: N/A;   COLONOSCOPY WITH PROPOFOL N/A 08/19/2020   Procedure: COLONOSCOPY WITH PROPOFOL;  Surgeon: Lesly Rubenstein, MD;  Location: [facility] ENDOSCOPY;  Service: Endoscopy;  Laterality: N/A;   CYSTOSCOPY/RETROGRADE/URETEROSCOPY/STONE EXTRACTION WITH BASKET Right 10/29/2014   Procedure: CYSTOSCOPY/URETEROSCOPY/STONE EXTRACTION WITH BASKET;  Surgeon: Franchot Gallo, MD;  Location: [facility] ORS;  Service: Urology;  Laterality: Right;   ESOPHAGOGASTRODUODENOSCOPY (EGD) WITH PROPOFOL N/A 11/18/2014   Procedure: ESOPHAGOGASTRODUODENOSCOPY (EGD) WITH PROPOFOL;  Surgeon: Josefine Class, MD;  Location: [facility] ENDOSCOPY;  Service: Endoscopy;  Laterality: N/A;   RE-EXCISION OF BREAST LUMPECTOMY Left 01/25/2017   Procedure: RE-EXCISION OF BREAST LUMPECTOMY;  Surgeon: Leonie Green, MD;  Location: [facility] ORS;  Service: General;  Laterality: Left;   SUBMANDIBULAR GLAND EXCISION Right 12/15/2017   Procedure: EXCISION SUBMANDIBULAR GLAND;  Surgeon: Carloyn Manner, MD;  Location: [facility] ORS;  Service: ENT;  Laterality: Right;   TONSILLECTOMY     Family History:  Family History  Problem Relation Age of Onset   Nephrolithiasis Mother    Non-Hodgkin's lymphoma Mother 100       currently 11   Heart disease Father    CVA Father    Nephrolithiasis Maternal  Grandfather    Diabetes Maternal Grandfather    Pancreatic cancer Maternal Grandfather 84       deceased 73   Breast cancer Sister 47       negative genetic testing; currently 31   Lymphoma Maternal Grandmother        deceased 28   Breast cancer Paternal Aunt        4 more paternal aunts with breast cancer dx 58s-70s   Breast cancer Cousin        2 paternal cousins; daughters of pat aunt w/ BC at 75   Colon cancer Cousin 87       son of mat aunt   Breast cancer Paternal Aunt 60       deceased 34 of MI   Family Psychiatric  History: See previous Social History:  Social History   Substance and Sexual Activity  Alcohol Use No     Social History   Substance and Sexual Activity  Drug Use No    Social History   Socioeconomic History   Marital status: Divorced    Spouse name: Not on file   Number of children: Not on file   Years of education: Not on file   Highest education level: Not on file  Occupational History   Not on file  Tobacco Use   Smoking status: Former    Packs/day: 1.00    Years: 15.00    Pack years: 15.00    Types: Cigarettes    Quit date: 12/30/2011    Years since quitting: 9.2   Smokeless tobacco: Never  Vaping Use   Vaping Use: Never used  Substance and Sexual Activity   Alcohol use: No   Drug use: No   Sexual activity: Never  Other Topics Concern   Not on file  Social History Narrative   Not on file   Social Determinants of Health   Financial Resource Strain: Not on file  Food Insecurity: Not on file  Transportation Needs: Not on file  Physical Activity: Not on file  Stress: Not on file  Social Connections: Not on file    Hospital Course: Patient was admitted to the psychiatric ward.  She did not have any problem behaviors while she was here.  Took care of her ADLs adequately.  Attended some groups although often spent most of her time in her room.  Patient was continued on Risperdal and dose was gradually increased up to a current dose  of 3 mg at night.  On a couple of occasions staff reported the patient had some paranoid complaints around bedtime but at no point did she act out and was always redirectable.  For the last 2 to 3 days she has denied any paranoia or hallucinations.  She has been counseled to do her best not to become intrusive and to try to remember that her paranoia and hallucinations have been a problem and that she should not act on them.  Patient will be discharged home with medications and follow-up.  No other major change to prescriptions or treatment.  We continued her Keppra at the current dosage.  Physical Findings:  AIMS:  , ,  ,  ,    CIWA:    COWS:     Musculoskeletal: Strength & Muscle Tone: decreased Gait & Station: broad based Patient leans: Right   Psychiatric Specialty Exam:  Presentation  General Appearance: Bizarre  Eye Contact:Good  Speech:Other (comment)  Speech Volume:Decreased  Handedness:Right   Mood and Affect  Mood:Anxious; Depressed  Affect:Flat; Inappropriate; Depressed; Blunt   Thought Process  Thought Processes:Disorganized  Descriptions of Associations:Loose  Orientation:Partial  Thought Content:Illogical; Rumination  History of Schizophrenia/Schizoaffective disorder:No  Duration of Psychotic Symptoms:Greater than six months  Hallucinations:No data recorded Ideas of Reference:Paranoia  Suicidal Thoughts:No data recorded Homicidal Thoughts:No data recorded  Sensorium  Memory:Immediate Fair; Recent Fair; Remote Fair  Judgment:Poor  Insight:Poor   Executive Functions  Concentration:Fair  Attention Span:Fair  [place]   Psychomotor Activity  Psychomotor Activity:No data recorded  Assets  Assets:Communication Skills; Desire for Improvement; Social Support; Physical Health; Housing   Sleep  Sleep:No data recorded   Physical Exam: Physical Exam Vitals and nursing note reviewed.   Constitutional:      Appearance: Normal appearance.  HENT:     Head: Normocephalic and atraumatic.     Mouth/Throat:     Pharynx: Oropharynx is clear.  Eyes:     Pupils: Pupils are equal, round, and reactive to light.  Cardiovascular:     Rate and Rhythm: Normal rate and regular rhythm.  Pulmonary:     Effort: Pulmonary effort is normal.     Breath sounds: Normal breath sounds.  Abdominal:     General: Abdomen is flat.     Palpations: Abdomen is soft.  Musculoskeletal:        General: Normal range of motion.  Skin:    General: Skin is warm and dry.  Neurological:     Mental Status: She is alert. Mental status is at baseline.     Cranial Nerves: Facial asymmetry present.     Motor: Weakness present.     Coordination: Coordination abnormal.     Gait: Gait abnormal.  Psychiatric:        Attention and Perception: Attention normal.        Mood and Affect: Mood normal.        Speech: She is noncommunicative.        Behavior: Behavior is cooperative.        Thought Content: Thought content normal.        Cognition and Memory: Cognition is impaired.   Review of Systems  Constitutional: Negative.   HENT: Negative.    Eyes: Negative.   Respiratory: Negative.    Cardiovascular: Negative.   Gastrointestinal: Negative.   Musculoskeletal: Negative.   Skin: Negative.   Neurological:  Positive for speech change, focal weakness and weakness.  Psychiatric/Behavioral: Negative.    Blood pressure 138/85, pulse (!) 118, temperature 97.8 F (36.6 C), temperature source Oral, resp. rate 18, height 5\' 4"  (1.626 m), weight 88 kg, SpO2 96 %. Body mass index is 33.3 kg/m.   Social History   Tobacco Use  Smoking Status Former   Packs/day: 1.00   Years: 15.00   Pack years: 15.00   Types: Cigarettes   Quit date: 12/30/2011   Years since quitting: 9.2  Smokeless Tobacco Never   Tobacco Cessation:  N/A, patient does not currently use tobacco products   Blood Alcohol level:  Lab  Results  Component Value Date   ETH <10 03/02/2021   ETH <10 02/24/2021  Metabolic Disorder Labs:  Lab Results  Component Value Date   HGBA1C 6.8 (H) 02/25/2021   MPG 148.46 02/25/2021   No results found for: PROLACTIN Lab Results  Component Value Date   CHOL 120 02/25/2021   TRIG 76 02/25/2021   HDL 43 02/25/2021   CHOLHDL 2.8 02/25/2021   VLDL 15 02/25/2021   LDLCALC 62 02/25/2021   LDLCALC 94 12/01/2013    See Psychiatric Specialty Exam and Suicide Risk Assessment completed by Attending Physician prior to discharge.  Discharge destination:  Home  Is patient on multiple antipsychotic therapies at discharge:  No   Has Patient had three or more failed trials of antipsychotic monotherapy by history:  No  Recommended Plan for Multiple Antipsychotic Therapies: NA  Discharge Instructions     Diet - low sodium heart healthy   Complete by: As directed    Increase activity slowly   Complete by: As directed       Allergies as of 03/11/2021   No Known Allergies      Medication List     STOP taking these medications    baclofen 10 MG tablet Commonly known as: LIORESAL   FLUoxetine 40 MG capsule Commonly known as: PROZAC       TAKE these medications      Indication  aspirin 81 MG EC tablet Take 1 tablet (81 mg total) by mouth daily. Swallow whole. Start taking on: March 12, 2021 What changed: additional instructions  Indication: Stroke Due To Limited Blood Flow   atorvastatin 40 MG tablet Commonly known as: LIPITOR Take 1 tablet (40 mg total) by mouth daily.  Indication: High Amount of Fats in the Blood   levETIRAcetam 500 MG tablet Commonly known as: KEPPRA Take 1 tablet (500 mg total) by mouth 2 (two) times daily.  Indication: Seizure   levothyroxine 100 MCG tablet Commonly known as: SYNTHROID Take 1 tablet (100 mcg total) by mouth daily at 6 (six) AM. Start taking on: March 12, 2021 What changed: when to take this  Indication:  Underactive Thyroid   losartan 50 MG tablet Commonly known as: COZAAR Take 1 tablet (50 mg total) by mouth daily.  Indication: High Blood Pressure Disorder   metFORMIN 500 MG tablet Commonly known as: GLUCOPHAGE Take 1 tablet (500 mg total) by mouth 2 (two) times daily with a meal. What changed: when to take this  Indication: Type 2 Diabetes   risperiDONE 3 MG tablet Commonly known as: RISPERDAL Take 1 tablet (3 mg total) by mouth at bedtime. What changed:  medication strength how much to take  Indication: Organic psychosis   trolamine salicylate 10 % cream Commonly known as: ASPERCREME Apply topically 2 (two) times daily as needed for muscle pain.  Indication: Musculoskeletal pain         Follow-up recommendations: Follow-up primary physician as well as with referrals to mental health treatment.  Continue current medication  Comments: Prescriptions provided.  Patient expresses understanding and agreement to plan  Signed: Alethia Berthold, MD 03/11/2021, 10:22 AM

## 2021-03-11 NOTE — BHH Suicide Risk Assessment (Signed)
[facility] Discharge Suicide Risk Assessment   Principal Problem: Organic psychosis Discharge Diagnoses: Principal Problem:   Organic psychosis Active Problems:   H/O: CVA (cerebrovascular accident)   Hx of seizure disorder   Total Time spent with patient: 30 minutes  Musculoskeletal: Strength & Muscle Tone: decreased Gait & Station: broad based Patient leans: Right  Psychiatric Specialty Exam  Presentation  General Appearance: Bizarre  Eye Contact:Good  Speech:Other (comment)  Speech Volume:Decreased  Handedness:Right   Mood and Affect  Mood:Anxious; Depressed  Duration of Depression Symptoms: No data recorded Affect:Flat; Inappropriate; Depressed; Blunt   Thought Process  Thought Processes:Disorganized  Descriptions of Associations:Loose  Orientation:Partial  Thought Content:Illogical; Rumination  History of Schizophrenia/Schizoaffective disorder:No  Duration of Psychotic Symptoms:Greater than six months  Hallucinations:No data recorded Ideas of Reference:Paranoia  Suicidal Thoughts:No data recorded Homicidal Thoughts:No data recorded  Sensorium  Memory:Immediate Fair; Recent Fair; Remote Fair  Judgment:Poor  Insight:Poor   Executive Functions  Concentration:Fair  Attention Span:Fair  [place]   Psychomotor Activity  Psychomotor Activity:No data recorded  Assets  Assets:Communication Skills; Desire for Improvement; Social Support; Physical Health; Housing   Sleep  Sleep:No data recorded  Physical Exam: Physical Exam Vitals and nursing note reviewed.  Constitutional:      Appearance: Normal appearance.  HENT:     Head: Normocephalic and atraumatic.     Mouth/Throat:     Pharynx: Oropharynx is clear.  Eyes:     Pupils: Pupils are equal, round, and reactive to light.  Cardiovascular:     Rate and Rhythm: Normal rate and regular rhythm.  Pulmonary:     Effort: Pulmonary effort is  normal.     Breath sounds: Normal breath sounds.  Abdominal:     General: Abdomen is flat.     Palpations: Abdomen is soft.  Musculoskeletal:        General: Normal range of motion.  Skin:    General: Skin is warm and dry.  Neurological:     General: No focal deficit present.     Mental Status: She is alert. Mental status is at baseline.     Motor: Weakness present.  Psychiatric:        Attention and Perception: Attention normal.        Mood and Affect: Mood normal.        Speech: Speech is delayed.        Behavior: Behavior is slowed.        Thought Content: Thought content normal.        Cognition and Memory: Cognition is impaired.   Review of Systems  Constitutional: Negative.   HENT: Negative.    Eyes: Negative.   Respiratory: Negative.    Cardiovascular: Negative.   Gastrointestinal: Negative.   Musculoskeletal: Negative.   Skin: Negative.   Neurological:  Positive for focal weakness and weakness.  Psychiatric/Behavioral: Negative.    Blood pressure 138/85, pulse (!) 118, temperature 97.8 F (36.6 C), temperature source Oral, resp. rate 18, height 5\' 4"  (1.626 m), weight 88 kg, SpO2 96 %. Body mass index is 33.3 kg/m.  Mental Status Per Nursing Assessment::   On Admission:  NA  Demographic Factors:  Caucasian and Living alone  Loss Factors: Decline in physical health  Historical Factors: NA  Risk Reduction Factors:   Positive social support and Positive therapeutic relationship  Continued Clinical Symptoms:  Medical Diagnoses and Treatments/Surgeries  Cognitive Features That Contribute To Risk:  None    Suicide Risk:  Minimal: No identifiable suicidal ideation.  Patients presenting with no risk factors but with morbid ruminations; may be classified as minimal risk based on the severity of the depressive symptoms    Plan Of Care/Follow-up recommendations:  Patient was never considered to be at high risk for suicide.  No suicidal ideation at any  point.  Patient is not acting out or showing any problem behavior at this time.  Psychotic symptoms have improved.  Patient agrees to outpatient follow-up and medication management  Alethia Berthold, MD 03/11/2021, 10:15 AM

## 2021-03-11 NOTE — BHH Counselor (Signed)
CSW faxed referrals for ACTT services to [facility] and [facility].  Both faxes were successful.  CSW reviewed with mother the pt's appointment with [facility] and follow ups with [facility] and [facility].  Mother expressed understanding.  Assunta Curtis, MSW, LCSW 03/11/2021 12:05 PM

## 2021-03-11 NOTE — Care Management Important Message (Signed)
Important Message  Patient Details  Name: Melissa Day MRN: [id number] Date of Birth: 07-16-1970   Medicare Important Message Given:  Yes  Patient was provided Medicare Important Message with [facility] contact information and explained her right to appeal discharge, patient expressed no interest in appealing discharge at this time.    Durenda Hurt, LCSWA 03/11/2021, 11:11 AM

## 2021-03-26 ENCOUNTER — Ambulatory Visit: Payer: Medicare Other | Admitting: Behavioral Health

## 2021-04-17 ENCOUNTER — Other Ambulatory Visit: Payer: Self-pay

## 2021-04-17 ENCOUNTER — Ambulatory Visit
Admission: RE | Admit: 2021-04-17 | Discharge: 2021-04-17 | Disposition: A | Discharge status: Home / Self Care | Payer: Medicare Other | Source: Ambulatory Visit | Attending: Radiation Oncology | Admitting: Radiation Oncology

## 2021-04-17 VITALS — BP 144/86 | HR 78 | Temp 97.1°F | Resp 16 | Wt 207.6 lb

## 2021-04-17 DIAGNOSIS — C50511 Malignant neoplasm of lower-outer quadrant of right female breast: Secondary | ICD-10-CM

## 2021-04-17 DIAGNOSIS — Z923 Personal history of irradiation: Secondary | ICD-10-CM | POA: Insufficient documentation

## 2021-04-17 DIAGNOSIS — Z86000 Personal history of in-situ neoplasm of breast: Secondary | ICD-10-CM | POA: Diagnosis present

## 2021-04-17 NOTE — Progress Notes (Signed)
Radiation Oncology ?Follow up Note ? ?Name: Melissa Day   ?Date:   04/17/2021 ?MRN:  [id number] ?DOB: [birth year]  ? ? ?This 51 y.o. 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 ? ?REFERRING PROVIDER: Tracie Harrier, MD ? ?HPI: Patient is a 51 year old female now out over 4 years having completed whole breast radiation to her left breast forductal carcinoma in situ seen today in routine follow-up she is doing well.  She specifically denies breast tenderness cough or bone pain..  She had a mammogram back in July which I reviewed was BI-RADS 2 benign.She also had a CT scan of her chest back in January that I reviewed showing a 6 mm nodule in the right upper lobe unchanged since 2019.  She also had a CT scan of her head back in January for some ental status changes which showed no acute intracranial abnormality. ? ?COMPLICATIONS OF TREATMENT: none ? ?FOLLOW UP COMPLIANCE: keeps appointments  ? ?PHYSICAL EXAM:  ?BP (!) 144/86   Pulse 78   Temp (!) 97.1 ?F (36.2 ?C) (Tympanic)   Resp 16   Wt 207 lb 9.6 oz (94.2 kg)   BMI 35.63 kg/m?  ?Lungs are clear to A&P cardiac examination essentially unremarkable with regular rate and rhythm. No dominant mass or nodularity is noted in either breast in 2 positions examined. Incision is well-healed. No axillary or supraclavicular adenopathy is appreciated.  There is some decreased volume of her left breast although cosmetic result is still excellent. ?RADIOLOGY RESULTS: Mammograms CT scans of chest and head all reviewed compatible with above-stated findings ? ?PLAN: Present time patient is doing well now out over 4 years with ductal carcinoma in situ status post whole breast radiation.  At this time I am going to discontinue follow-up care.  Her mammograms will be ordered By Dr. Ginette Pitman.  Patient knows to call at anytime with any concerns. ? ?I would like to take this  opportunity to thank you for allowing me to participate in the care of your patient.. ?  ? Noreene Filbert, MD ? ?

## 2021-11-23 ENCOUNTER — Encounter: Payer: Self-pay | Admitting: Licensed Clinical Social Worker

## 2021-11-23 DIAGNOSIS — Z1379 Encounter for other screening for genetic and chromosomal anomalies: Secondary | ICD-10-CM | POA: Insufficient documentation

## 2021-11-23 NOTE — Telephone Encounter (Signed)
UPDATE: VUS in BLM called [id number] identified. The amended report date is 11/20/2021.

## 2022-05-19 ENCOUNTER — Encounter: Payer: Self-pay | Admitting: Intensive Care

## 2022-05-19 ENCOUNTER — Emergency Department
Admission: EM | Admit: 2022-05-19 | Discharge: 2022-05-19 | Disposition: A | Discharge status: Home / Self Care | Payer: 59 | Attending: Emergency Medicine | Admitting: Emergency Medicine

## 2022-05-19 ENCOUNTER — Emergency Department: Payer: 59

## 2022-05-19 ENCOUNTER — Other Ambulatory Visit: Payer: Self-pay

## 2022-05-19 DIAGNOSIS — R109 Unspecified abdominal pain: Secondary | ICD-10-CM | POA: Diagnosis present

## 2022-05-19 DIAGNOSIS — Z8673 Personal history of transient ischemic attack (TIA), and cerebral infarction without residual deficits: Secondary | ICD-10-CM | POA: Insufficient documentation

## 2022-05-19 DIAGNOSIS — R1084 Generalized abdominal pain: Secondary | ICD-10-CM

## 2022-05-19 DIAGNOSIS — K5901 Slow transit constipation: Secondary | ICD-10-CM | POA: Diagnosis not present

## 2022-05-19 HISTORY — DX: Aphasia: R47.01

## 2022-05-19 LAB — PREGNANCY, URINE: Preg Test, Ur: NEGATIVE

## 2022-05-19 LAB — CBC WITH DIFFERENTIAL/PLATELET
Abs Immature Granulocytes: 0.04 K/uL (ref 0.00–0.07)
Basophils Absolute: 0.1 K/uL (ref 0.0–0.1)
Basophils Relative: 1 %
Eosinophils Absolute: 0.1 K/uL (ref 0.0–0.5)
Eosinophils Relative: 2 %
HCT: 37.2 % (ref 36.0–46.0)
Hemoglobin: 12.4 g/dL (ref 12.0–15.0)
Immature Granulocytes: 1 %
Lymphocytes Relative: 15 %
Lymphs Abs: 1.3 K/uL (ref 0.7–4.0)
MCH: 30.3 pg (ref 26.0–34.0)
MCHC: 33.3 g/dL (ref 30.0–36.0)
MCV: 91 fL (ref 80.0–100.0)
Monocytes Absolute: 0.6 K/uL (ref 0.1–1.0)
Monocytes Relative: 7 %
Neutro Abs: 6.6 K/uL (ref 1.7–7.7)
Neutrophils Relative %: 74 %
Platelets: 240 K/uL (ref 150–400)
RBC: 4.09 MIL/uL (ref 3.87–5.11)
RDW: 13.2 % (ref 11.5–15.5)
WBC: 8.6 K/uL (ref 4.0–10.5)
nRBC: 0 % (ref 0.0–0.2)

## 2022-05-19 LAB — URINALYSIS, ROUTINE W REFLEX MICROSCOPIC
Bilirubin Urine: NEGATIVE
Glucose, UA: NEGATIVE mg/dL
Hgb urine dipstick: NEGATIVE
Ketones, ur: NEGATIVE mg/dL
Leukocytes,Ua: NEGATIVE
Nitrite: NEGATIVE
Protein, ur: NEGATIVE mg/dL
Specific Gravity, Urine: 1.009 (ref 1.005–1.030)
pH: 6 (ref 5.0–8.0)

## 2022-05-19 LAB — COMPREHENSIVE METABOLIC PANEL WITH GFR
ALT: 19 U/L (ref 0–44)
AST: 26 U/L (ref 15–41)
Albumin: 3.9 g/dL (ref 3.5–5.0)
Alkaline Phosphatase: 52 U/L (ref 38–126)
Anion gap: 9 (ref 5–15)
BUN: 11 mg/dL (ref 6–20)
CO2: 22 mmol/L (ref 22–32)
Calcium: 9.1 mg/dL (ref 8.9–10.3)
Chloride: 107 mmol/L (ref 98–111)
Creatinine, Ser: 0.95 mg/dL (ref 0.44–1.00)
GFR, Estimated: >60 mL/min
Glucose, Bld: 153 mg/dL — ABNORMAL HIGH (ref 70–99)
Potassium: 3.3 mmol/L — ABNORMAL LOW (ref 3.5–5.1)
Sodium: 138 mmol/L (ref 135–145)
Total Bilirubin: 1 mg/dL (ref 0.3–1.2)
Total Protein: 6.6 g/dL (ref 6.5–8.1)

## 2022-05-19 LAB — LIPASE, BLOOD: Lipase: 21 U/L (ref 11–51)

## 2022-05-19 LAB — TROPONIN I (HIGH SENSITIVITY): Troponin I (High Sensitivity): 6 ng/L (ref <18)

## 2022-05-19 MED ORDER — SORBITOL 70 % SOLN
960.0000 mL | TOPICAL_OIL | Freq: Once | ORAL | Status: AC
  Administered 2022-05-19: 960 mL via RECTAL
  Filled 2022-05-19: qty 240

## 2022-05-19 MED ORDER — BISACODYL 10 MG RE SUPP
10.0000 mg | Freq: Once | RECTAL | Status: AC
  Administered 2022-05-19: 10 mg via RECTAL
  Filled 2022-05-19: qty 1

## 2022-05-19 MED ORDER — IOHEXOL 300 MG/ML  SOLN
100.0000 mL | Freq: Once | INTRAMUSCULAR | Status: AC | PRN
  Administered 2022-05-19: 100 mL via INTRAVENOUS

## 2022-05-19 MED ORDER — SODIUM CHLORIDE 0.9 % IV BOLUS
1000.0000 mL | Freq: Once | INTRAVENOUS | Status: AC
  Administered 2022-05-19: 1000 mL via INTRAVENOUS

## 2022-05-19 MED ORDER — DOCUSATE SODIUM 250 MG PO CAPS
250.0000 mg | ORAL_CAPSULE | Freq: Every day | ORAL | 0 refills | Status: AC
Start: 2022-05-19 — End: 2022-06-18

## 2022-05-19 MED ORDER — POLYETHYLENE GLYCOL 3350 17 G PO PACK: PACK | ORAL | 0 refills | Status: AC

## 2022-05-19 MED ORDER — LACTULOSE 10 GM/15ML PO SOLN
20.0000 g | Freq: Once | ORAL | Status: AC
  Administered 2022-05-19: 20 g via ORAL
  Filled 2022-05-19: qty 30

## 2022-05-19 MED ORDER — MORPHINE SULFATE (PF) 4 MG/ML IV SOLN
4.0000 mg | Freq: Once | INTRAVENOUS | Status: AC
  Administered 2022-05-19: 4 mg via INTRAVENOUS
  Filled 2022-05-19: qty 1

## 2022-05-19 NOTE — ED Notes (Signed)
EDP at bedside again and will do enema now also.

## 2022-05-19 NOTE — ED Notes (Signed)
Pt to ED with mother for abdominal pain since 2 weeks. LBM 2 weeks ago. Pain is sharp and across whole lower abdomen. Pt is alert, oriented. Mother is legal guardian and POA. 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

## 2022-05-19 NOTE — ED Notes (Signed)
Will do 1L bolus once pt off toilet and done with rest of enema.

## 2022-05-19 NOTE — ED Notes (Signed)
Pt repositioned in bed. Pt has not had BM since suppository except a smear. Peri care performed. Mother at bedside.

## 2022-05-19 NOTE — ED Notes (Signed)
Covered with more warm blankets.

## 2022-05-19 NOTE — ED Notes (Signed)
Helped pt up to toilet for possible BM. Pt able to walk slowly without assistance. Pt will call RN when finished.

## 2022-05-19 NOTE — ED Notes (Signed)
Pt to CT. Warm blankets provided.

## 2022-05-19 NOTE — ED Triage Notes (Signed)
Patient c/o abdominal pain X1 week. Last BM unknown. 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 Also has aphasia

## 2022-05-19 NOTE — ED Provider Notes (Signed)
[facility] Provider Note    Event Date/Time   First MD Initiated Contact with Patient 05/19/22 [phone]     (approximate)   History   Abdominal Pain   HPI  Melissa Day is a 52 y.o. female  here with abdominal pain. History provided primarily by pt's mother. Per report, pt has had worsening abdominal pain and constipation for 1 week. Pt has h/o same. She has had increasing abdominal pain/distension since then and decreased appetite. Feels a "bulge" when she tried to have a BM. No blood in stools. No major h/o impaction. No recent med changes.        Physical Exam   Triage Vital Signs: ED Triage Vitals  Enc Vitals Group     BP 05/19/22 0845 108/78     Pulse Rate 05/19/22 0845 (!) 104     Resp 05/19/22 0845 16     Temp 05/19/22 0848 (!) 97.5 F (36.4 C)     Temp Source 05/19/22 0848 Oral     SpO2 05/19/22 0845 99 %     Weight 05/19/22 0846 160 lb (72.6 kg)     Height 05/19/22 0846 5\' 4"  (1.626 m)     Head Circumference --      Peak Flow --      Pain Score 05/19/22 0846 10     Pain Loc --      Pain Edu? --      Excl. in GC? --     Most recent vital signs: Vitals:   05/19/22 1330 05/19/22 1342  BP: 125/79   Pulse: 69   Resp: 15   Temp:  97.6 F (36.4 C)  SpO2: 99%      General: Awake, no distress.  CV:  Good peripheral perfusion.  Resp:  Normal work of breathing. Lungs clear. Abd:  No distention. Mild TTP in LLQ, no overt distension. No peritonitis. Other:  No LE edema.   ED Results / Procedures / Treatments   Labs (all labs ordered are listed, but only abnormal results are displayed) Labs Reviewed  COMPREHENSIVE METABOLIC PANEL - Abnormal; Notable for the following components:      Result Value   Potassium 3.3 (*)    Glucose, Bld 153 (*)    All other components within normal limits  URINALYSIS, ROUTINE W REFLEX MICROSCOPIC - Abnormal; Notable for the following components:   Color, Urine YELLOW (*)    APPearance CLEAR (*)     All other components within normal limits  CBC WITH DIFFERENTIAL/PLATELET  LIPASE, BLOOD  PREGNANCY, URINE  TROPONIN I (HIGH SENSITIVITY)     EKG    RADIOLOGY CT a/P: No acute findings, mildly dilated common bile duct, correlate with LFTs   I also independently reviewed and agree with radiologist interpretations.   PROCEDURES:  Critical Care performed: No   MEDICATIONS ORDERED IN ED: Medications  morphine (PF) 4 MG/ML injection 4 mg (4 mg Intravenous Given 05/19/22 0930)  sodium chloride 0.9 % bolus 1,000 mL (0 mLs Intravenous Stopped 05/19/22 1048)  sorbitol, milk of mag, mineral oil, glycerin (SMOG) enema (960 mLs Rectal Given 05/19/22 1138)  iohexol (OMNIPAQUE) 300 MG/ML solution 100 mL (100 mLs Intravenous Contrast Given 05/19/22 1049)  lactulose (CHRONULAC) 10 GM/15ML solution 20 g (20 g Oral Given 05/19/22 1210)  sodium chloride 0.9 % bolus 1,000 mL (0 mLs Intravenous Stopped 05/19/22 1342)  bisacodyl (DULCOLAX) suppository 10 mg (10 mg Rectal Given 05/19/22 1342)     IMPRESSION /  MDM / ASSESSMENT AND PLAN / ED COURSE  I reviewed the triage vital signs and the nursing notes.                              Differential diagnosis includes, but is not limited to, constipation, obstruction, fecal impaction, diverticulitis, colitis, UTI  Patient's presentation is most consistent with acute presentation with potential threat to life or bodily function.  The patient is on the cardiac monitor to evaluate for evidence of arrhythmia and/or significant heart rate changes  52 yo F with h/o CVA here with constipation and abdominal pain. Pt has significant stool burden noted on CT but o/w no signs of acute emergent pathology. No signs of ischemia or stercoral colitis or major impaction. CBC without leukocytosis. CMP largely unremarkable. UA without signs of UTI. Lipase normal. Pt given enema and lactulose with production of stool. Will d/c with good bowel regimen at home and outpt  follow-up.    FINAL CLINICAL IMPRESSION(S) / ED DIAGNOSES   Final diagnoses:  Slow transit constipation  Generalized abdominal pain     Rx / DC Orders   ED Discharge Orders          Ordered    docusate sodium (COLACE) 250 MG capsule  Daily        05/19/22 1416    polyethylene glycol (MIRALAX) 17 g packet        05/19/22 1416             Note:  This document was prepared using Dragon voice recognition software and may include unintentional dictation errors.   Shaune Pollack, MD 05/19/22 [phone]

## 2022-05-19 NOTE — ED Notes (Signed)
This RN has restarted SMOG enema several times, some stayed in and some dribbled out. Pt sat on toilet twice. Pt had 1 BM that was soft and small with first enema attempt. Pt is now in bedpan. EDP aware.

## 2022-05-19 NOTE — ED Notes (Signed)
Pt up to toilet again before suppository. Pt had watery BM and voided.

## 2022-05-19 NOTE — Discharge Instructions (Addendum)
For your constipation:  Take COLACE stool softener DAILY to prevent constipation.  Take MIRALAX 2-3 times daily for the next few days until bowel movements are soft, then cut this back to once daily to prevent constipation  Try to increase dietary fiber intake

## 2022-06-01 ENCOUNTER — Encounter: Payer: Self-pay | Admitting: Psychiatry

## 2022-06-01 ENCOUNTER — Ambulatory Visit (INDEPENDENT_AMBULATORY_CARE_PROVIDER_SITE_OTHER): Payer: 59 | Admitting: Psychiatry

## 2022-06-01 VITALS — BP 103/69 | HR 97 | Temp 97.4°F | Ht 64.0 in | Wt 159.6 lb

## 2022-06-01 DIAGNOSIS — F29 Unspecified psychosis not due to a substance or known physiological condition: Secondary | ICD-10-CM | POA: Diagnosis not present

## 2022-06-01 DIAGNOSIS — R569 Unspecified convulsions: Secondary | ICD-10-CM | POA: Insufficient documentation

## 2022-06-01 DIAGNOSIS — I639 Cerebral infarction, unspecified: Secondary | ICD-10-CM | POA: Insufficient documentation

## 2022-06-01 NOTE — Patient Instructions (Addendum)
Levetiracetam may cause behavioral abnormalities and psychotic symptoms; use with caution in patients who have pre-existing psychosis or schizophrenia. Monitor all patients for psychiatric signs and symptoms and consider therapy discontinuation if such symptoms become apparent. Numerous non-psychotic behavioral symptoms, including aggression, agitation, depression, and irritability, have been reported. Behavioral symptoms were associated with drug discontinuation or dose reduction during clinical trials. Psychosis was reported within the first week of treatment and resolved within 1 to 2 weeks after discontinuation during clinical trials.  Amantadine: Patients should be monitored for depression, suicidal ideation or behavior, and hallucinations throughout treatment with amantadine. During clinical trial evaluation of the extended-release amantadine capsules (e.g., Gocovri), adverse effects including confusion, depression, depressed mood, suicidal ideation, suicide attempt, visual hallucination, auditory hallucination, delusions, illusions, and paranoia occurred in more patients receiving amantadine than placebo. Patients with a major psychotic disorder (e.g., schizophrenia, psychosis) should generally not be treated with amantadine because of the risk of exacerbating psychosis. Prescribers should consider whether the benefits outweigh the risks of treatment with amantadine in patients with a history of suicidality or depression. Suicide attempts and suicidal ideation have been reported in patients with and without a prior history of a psychiatric disorder. Because overdose has been lethal with an acute dose as low as 1 gram, amantadine should be prescribed in the smallest quantity consistent with good patient management in order to reduce the risk of overdose. A benefit versus risk assessment should be considered in patients with a substance abuse disorder, since exacerbation of symptoms may occur.

## 2022-06-01 NOTE — Progress Notes (Unsigned)
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  Patient being a limited historian majority of information obtained from mother-Gail.  Patient also was able to answer questions appropriately.  According to mother patient is currently with [facility], she has a nurse who visits her once a week and also has a counselor who gives psychotherapy.  She is currently on Abilify Maintena IM 400 mg monthly as well as trazodone which she takes for sleep.  Mother reports she needed a second opinion and hence decided to consult with this provider.  She is worried about patient being too overmedicated and likely having side effects from it.  Patient today does report having possible side effects to Abilify, lip smacking, drooling which is better now since she is on this medication called amantadine which was recently added.  Patient denies any current paranoia or other psychotic symptoms.  Did not appear to be preoccupied with any delusions, did not appear to be responding to internal stimuli.  Patient did become tearful during the session when she was asked about her son who is currently not living with her.  She does worry about her son.  She denies any other symptoms including anxiety or depression.  Reports she spends her time watching TV.  She does live with her boyfriend who is supportive, goes outdoors with him.  Reports sleep is okay as he takes the trazodone.  Patient denies any suicidality, homicidality.  Her mother who  provided collateral information she is worried about her daughter being on a high dosage of Abilify since she is having side effects.  Recently amantadine was added for side effects.  She has not had a seizure in a very long time.  She continues to follow up with Dr. Marcello Fennel currently on Keppra.    Visit Diagnosis:    ICD-10-CM   1. Psychosis, unspecified psychosis type  F29       Past Psychiatric History: Reviewed notes-per Dr. 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  Past Medical History:  Past Medical History:  Diagnosis Date   Anemia    Aphasia    Cancer 2018   br cancer on left with lump and rad tx    Diabetes mellitus without complication    Family history of colonic polyps    H/O: CVA (cerebrovascular accident) 2012   a. cerebral edema, craniotomy, residual right upper & lower limb weakness   Heart murmur    High cholesterol    History of colon polyps    History of echocardiogram    a. 12/01/2013: EF 50-55%, nl global LV systolic function, mild MR, mildly increased LV posterior wall thickness   History of kidney stones    History of stress test    a. 12/01/2013: no significant ischemia, no significant WMA, no EKG changes concerning for ischemia, EF 67%, low risk scan   Hx of seizure disorder    a. one episode 10 months after CVA, since  then on Keppra    Hypothyroidism    Personal history of radiation therapy 2019   LEFT lumpectomy   Seizures    Stroke 2012   Thyroid disease     Past Surgical History:  Procedure Laterality Date   BRAIN SURGERY     decompression after stroke   BREAST BIOPSY Left 12/13/2016   path pending   BREAST LUMPECTOMY Left 01/06/2017   DUCTAL CARCINOMA IN SITU, NUCLEAR GRADE 3.   BREAST LUMPECTOMY WITH NEEDLE LOCALIZATION Left 01/06/2017   Procedure: BREAST LUMPECTOMY WITH NEEDLE LOCALIZATION;   Surgeon: Nadeen Landau, MD;  Location: [facility] ORS;  Service: General;  Laterality: Left;   COLONOSCOPY WITH PROPOFOL N/A 11/18/2014   Procedure: COLONOSCOPY WITH PROPOFOL;  Surgeon: Elnita Maxwell, MD;  Location: [facility] ENDOSCOPY;  Service: Endoscopy;  Laterality: N/A;   COLONOSCOPY WITH PROPOFOL N/A 08/19/2020   Procedure: COLONOSCOPY WITH PROPOFOL;  Surgeon: Regis Bill, MD;  Location: [facility] ENDOSCOPY;  Service: Endoscopy;  Laterality: N/A;   CYSTOSCOPY/RETROGRADE/URETEROSCOPY/STONE EXTRACTION WITH BASKET Right 10/29/2014   Procedure: CYSTOSCOPY/URETEROSCOPY/STONE EXTRACTION WITH BASKET;  Surgeon: Marcine Matar, MD;  Location: [facility] ORS;  Service: Urology;  Laterality: Right;   ESOPHAGOGASTRODUODENOSCOPY (EGD) WITH PROPOFOL N/A 11/18/2014   Procedure: ESOPHAGOGASTRODUODENOSCOPY (EGD) WITH PROPOFOL;  Surgeon: Elnita Maxwell, MD;  Location: [facility] ENDOSCOPY;  Service: Endoscopy;  Laterality: N/A;   RE-EXCISION OF BREAST LUMPECTOMY Left 01/25/2017   Procedure: RE-EXCISION OF BREAST LUMPECTOMY;  Surgeon: Nadeen Landau, MD;  Location: [facility] ORS;  Service: General;  Laterality: Left;   SUBMANDIBULAR GLAND EXCISION Right 12/15/2017   Procedure: EXCISION SUBMANDIBULAR GLAND;  Surgeon: Bud Face, MD;  Location: [facility] ORS;  Service: ENT;  Laterality: Right;   TONSILLECTOMY      Family Psychiatric History: As noted below.  Family History:  Family History  Problem Relation Age of Onset   Nephrolithiasis Mother    Non-Hodgkin's lymphoma Mother 25       currently 50   Heart disease Father    CVA Father    Breast cancer Sister 9       negative genetic testing; currently 24   Breast cancer Paternal Aunt        4 more paternal aunts with breast cancer dx 60s-70s   Breast cancer Paternal Aunt 68       deceased 61 of MI   Nephrolithiasis Maternal Grandfather    Diabetes Maternal Grandfather    Pancreatic cancer Maternal Grandfather 46       deceased 49   Lymphoma  Maternal Grandmother        deceased 56   Breast cancer Cousin        2 paternal cousins; daughters of pat aunt w/ BC at 75   Colon cancer Cousin 10       son of mat aunt   Mental illness Neg Hx     Social History: Patient was born and raised in [place] by both parents.  She had a good childhood.  She has 1 sister who is older.  She went up to 12th grade.  Patient is currently on SSD.  She was married x 2, divorced x 2.  She has 1 son who is around 2 years old and is estranged.  Patient currently lives with her boyfriend in [place].  She does have a good support system from her mother. Social History   Socioeconomic History   Marital status: Significant Other    Spouse name: Not on file   Number of  children: 1   Years of education: Not on file   Highest education level: 12th grade  Occupational History   Occupation: disability  Tobacco Use   Smoking status: Former    Packs/day: 1.00    Years: 15.00    Additional pack years: 0.00    Total pack years: 15.00    Types: Cigarettes    Quit date: 12/30/2011    Years since quitting: 10.4   Smokeless tobacco: Never  Vaping Use   Vaping Use: Never used  Substance and Sexual Activity   Alcohol use: No   Drug use: No   Sexual activity: Not on file  Other Topics Concern   Not on file  Social History Narrative   Not on file   Social Determinants of Health   Financial Resource Strain: Not on file  Food Insecurity: Not on file  Transportation Needs: Not on file  Physical Activity: Not on file  Stress: Not on file  Social Connections: Not on file    Allergies: No Known Allergies  Metabolic Disorder Labs: Lab Results  Component Value Date   HGBA1C 6.8 (H) 02/25/2021   MPG 148.46 02/25/2021   No results found for: "PROLACTIN" Lab Results  Component Value Date   CHOL 120 02/25/2021   TRIG 76 02/25/2021   HDL 43 02/25/2021   CHOLHDL 2.8 02/25/2021   VLDL 15 02/25/2021   LDLCALC 62 02/25/2021   LDLCALC 94 12/01/2013    Lab Results  Component Value Date   TSH 0.162 (L) 02/25/2021   TSH 2.919 07/29/2017    Therapeutic Level Labs: No results found for: "LITHIUM" No results found for: "VALPROATE" No results found for: "CBMZ"  Current Medications: Current Outpatient Medications  Medication Sig Dispense Refill   ABILIFY MAINTENA 300 MG PRSY prefilled syringe Inject into the muscle.     amantadine (SYMMETREL) 100 MG capsule Take 300 mg by mouth daily.     ARIPiprazole (ABILIFY) 5 MG tablet Take 5 mg by mouth daily.     aspirin EC 81 MG EC tablet Take 1 tablet (81 mg total) by mouth daily. Swallow whole. 30 tablet 1   atorvastatin (LIPITOR) 40 MG tablet Take 1 tablet (40 mg total) by mouth daily. 30 tablet 1   docusate sodium (COLACE) 250 MG capsule Take 1 capsule (250 mg total) by mouth daily. 30 capsule 0   levETIRAcetam (KEPPRA) 500 MG tablet Take 1 tablet (500 mg total) by mouth 2 (two) times daily. 60 tablet 1   levothyroxine (SYNTHROID) 100 MCG tablet Take 1 tablet (100 mcg total) by mouth daily at 6 (six) AM. 30 tablet 1   losartan (COZAAR) 50 MG tablet Take 1 tablet (50 mg total) by mouth daily. 30 tablet 1   metFORMIN (GLUCOPHAGE) 500 MG tablet Take 1 tablet (500 mg total) by mouth 2 (two) times daily with a meal. 60 tablet 1   polyethylene glycol (MIRALAX) 17 g packet Take one package/cap full three times daily until bowel movements are soft, then take daily to prevent constipation 72 each 0   trolamine salicylate (ASPERCREME) 10 % cream Apply topically 2 (two) times daily as needed for muscle pain. 85 g 0   No current facility-administered medications for this visit.     Musculoskeletal: Strength & Muscle Tone:  Rt, sided weakness residual from stroke , hx of contractures Gait & Station:  slow , unsteady , has leg braced on right side  Patient leans: N/A  Psychiatric Specialty Exam: Review of Systems  Unable to perform ROS: Psychiatric disorder    Blood pressure 103/69, pulse 97,  temperature (!) 97.4 F (36.3 C), temperature source Skin, height 5\' 4"  (1.626 m), weight 159 lb 9.6 oz (72.4 kg).Body mass index is 27.4 kg/m.  General Appearance: Casual  Eye Contact:  Fair  Speech:   dysarthric  Volume:  Decreased  Mood:  Anxious  Affect:  Tearful  Thought Process:  Goal Directed and Descriptions of Associations: Intact  Orientation:  Other:  self , situation  Thought Content: Logical   Suicidal Thoughts:  No  Homicidal Thoughts:  No  Memory:  Immediate;   Fair Recent;   Fair Remote;   Limited  Judgement:  Fair  Insight:  Fair  Psychomotor Activity:  Normal  Concentration:  Concentration: fair and Attention Span: Fair  Recall:   limited  Fund of Knowledge: Fair  Language: Fair  Akathisia:  No  Handed:  Right  AIMS (if indicated): not done  Assets:  Desire for Improvement Housing Social Support  ADL's:  Intact  Cognition: WNL  Sleep:  Fair   Screenings: AUDIT    Flowsheet Row Admission (Discharged) from 03/03/2021 in [facility] INPATIENT BEHAV[facility]AL MEDICINE Admission (Discharged) from 02/24/2021 in [facility] INPATIENT BEHAVIORAL MEDICINE  Alcohol Use Disorder Identification Test Final Score (AUDIT) 0 0      GAD-7    Flowsheet Row Office Visit from 06/01/2022 in [facility]  Total GAD-7 Score 1      PHQ2-9    Flowsheet Row Office Visit from 06/01/2022 in [facility]  PHQ-2 Total Score 3  PHQ-9 Total Score 11      Flowsheet Row Office Visit from 06/01/2022 in [facility] ED from 05/19/2022 in [facility] at [facility] Admission (Discharged) from 03/03/2021 in [facility] INPATIENT BEHAVIORAL MEDICINE  C-SSRS RISK CATEGORY Error: Q3, 4, or 5 should not be populated when Q2 is No No Risk No Risk        Assessment and Plan: ORIA KLIMAS is a 78 year old Caucasian female who lives with her boyfriend in [place],  has a history of psychosis, seizure disorder, CVA with right sided weakness, hypothyroidism and multiple other medical problems was evaluated in office today.  Patient as well as mother presented for consultation due to concerns about whether patient is being overmedicated and due to concerns of side effects.  Discussed plan as noted below.  Plan  Psychosis unspecified-currently improved Patient currently reported is on Abilify Maintena 400 mg IM every 30 days.  Patient currently under the care of [facility]. Patient also on amantadine 300 mg p.o. daily in divided dosage. Trazodone 150 mg p.o. nightly. Discussed patient could consider reducing the dosage of Abilify Maintena or could change to Abilify p.o. and reduce it to a low dosage if currently struggling with side effects. Amantadine could lower seizure threshold as well as could cause side effects of hallucinations/psychosis.  Patient advised to monitor herself closely since she is on this medication.  Could consider changing amantadine to another medication to help with the side effects of Abilify Maintena in the future. Discussed with patient to avoid taking trazodone 150 mg in the morning since according to the history provided it was prescribed as trazodone 150 twice daily.  Trazodone could cause sedation and drowsiness during the day.   Patient with unspecified psychosis which started a year ago, patient with a history of seizures, CVA on Keppra-Keppra does carry a  side effect of psychosis and other mood symptoms including agitation.  Patient to discuss with primary care provider regarding other anticonvulsant medications to replace Keppra if possible-including lamotrigine, Depakote.  Patient as well as mother agrees to discuss this with Dr. Marcello Fennel .  Will coordinate care with Dr. Marcello Fennel, will route today's notes.  Collateral information obtained from mother as noted above.  Follow-up in clinic as needed.  Collaboration of Care:  Collaboration of Care: Other I have reviewed notes for Dr. Toni Amend as noted above most recent inpatient behavioral health admission-discharged on 03/11/2021, I have discussed with staff-to request medical records from [facility]-patient has signed and her wife.  Patient/Guardian was advised Release of Information must be obtained prior to any record release in order to collaborate their care with an outside provider. Patient/Guardian was advised if they have not already done so to contact the registration department to sign all necessary forms in order for [place] to release information regarding their care.   Consent: Patient/Guardian gives verbal consent for treatment and assignment of benefits for services provided during this visit. Patient/Guardian expressed understanding and agreed to proceed.   This note was generated in part or whole with voice recognition software. Voice recognition is usually quite accurate but there are transcription errors that can and very often do occur. I apologize for any typographical errors that were not detected and corrected.    Jomarie Longs, MD 06/01/2022, 12:47 PM

## 2022-09-16 ENCOUNTER — Other Ambulatory Visit: Payer: Self-pay | Admitting: Internal Medicine

## 2022-09-16 DIAGNOSIS — Z1231 Encounter for screening mammogram for malignant neoplasm of breast: Secondary | ICD-10-CM

## 2022-10-14 ENCOUNTER — Ambulatory Visit
Admission: RE | Admit: 2022-10-14 | Discharge: 2022-10-14 | Disposition: A | Discharge status: Home / Self Care | Payer: 59 | Source: Ambulatory Visit | Attending: Internal Medicine | Admitting: Internal Medicine

## 2022-10-14 DIAGNOSIS — Z1231 Encounter for screening mammogram for malignant neoplasm of breast: Secondary | ICD-10-CM | POA: Diagnosis present

## 2023-08-09 ENCOUNTER — Ambulatory Visit (HOSPITAL_COMMUNITY): Admitting: Licensed Clinical Social Worker

## 2023-09-07 ENCOUNTER — Other Ambulatory Visit: Payer: Self-pay | Admitting: Internal Medicine

## 2023-09-07 DIAGNOSIS — Z1231 Encounter for screening mammogram for malignant neoplasm of breast: Secondary | ICD-10-CM

## 2023-10-18 ENCOUNTER — Ambulatory Visit
Admission: RE | Admit: 2023-10-18 | Discharge: 2023-10-18 | Disposition: A | Discharge status: Home / Self Care | Source: Ambulatory Visit | Attending: Internal Medicine | Admitting: Internal Medicine

## 2023-10-18 DIAGNOSIS — Z1231 Encounter for screening mammogram for malignant neoplasm of breast: Secondary | ICD-10-CM | POA: Insufficient documentation

## 2024-02-29 ENCOUNTER — Other Ambulatory Visit: Payer: Self-pay

## 2024-02-29 ENCOUNTER — Encounter
Admission: RE | Admit: 2024-02-29 | Discharge: 2024-02-29 | Disposition: A | Discharge status: Home / Self Care | Source: Ambulatory Visit | Attending: General Surgery | Admitting: General Surgery

## 2024-02-29 ENCOUNTER — Inpatient Hospital Stay: Admission: RE | Admit: 2024-02-29 | Source: Ambulatory Visit

## 2024-02-29 ENCOUNTER — Ambulatory Visit: Payer: Self-pay | Admitting: General Surgery

## 2024-02-29 VITALS — Ht 64.0 in | Wt 189.0 lb

## 2024-02-29 DIAGNOSIS — Z01818 Encounter for other preprocedural examination: Secondary | ICD-10-CM

## 2024-02-29 DIAGNOSIS — E1165 Type 2 diabetes mellitus with hyperglycemia: Secondary | ICD-10-CM

## 2024-02-29 DIAGNOSIS — Z8673 Personal history of transient ischemic attack (TIA), and cerebral infarction without residual deficits: Secondary | ICD-10-CM

## 2024-02-29 DIAGNOSIS — Z01812 Encounter for preprocedural laboratory examination: Secondary | ICD-10-CM

## 2024-02-29 DIAGNOSIS — Z01810 Encounter for preprocedural cardiovascular examination: Secondary | ICD-10-CM

## 2024-02-29 DIAGNOSIS — R011 Cardiac murmur, unspecified: Secondary | ICD-10-CM

## 2024-02-29 DIAGNOSIS — L720 Epidermal cyst: Secondary | ICD-10-CM

## 2024-02-29 HISTORY — DX: Type 2 diabetes mellitus without complications: E11.9

## 2024-02-29 HISTORY — DX: Depression, unspecified: F32.A

## 2024-02-29 HISTORY — DX: Spastic hemiplegia affecting unspecified side: G81.10

## 2024-02-29 HISTORY — DX: Epidermal cyst: L72.0

## 2024-02-29 HISTORY — DX: Vitamin D deficiency, unspecified: E55.9

## 2024-02-29 HISTORY — DX: Intraductal carcinoma in situ of left breast: D05.12

## 2024-02-29 NOTE — Patient Instructions (Addendum)
 Your procedure is scheduled on:03-05-24 Monday Report to the Registration Desk on the 1st floor of the Medical Mall.Then proceed to the 2nd floor Surgery Desk To find out your arrival time, please call [phone] between 1PM - 3PM on:03-02-24 Friday If your arrival time is 6:00 am, do not arrive before that time as the Medical Mall entrance doors do not open until 6:00 am.  REMEMBER: Instructions that are not followed completely may result in serious medical risk, up to and including death; or upon the discretion of your surgeon and anesthesiologist your surgery may need to be rescheduled.  Do not eat food OR drink liquids after midnight the night before surgery.  No gum chewing or hard candies.  One week prior to surgery:Stop NOW (02-29-24) Stop Anti-inflammatories (NSAIDS) such as Advil, Aleve, Ibuprofen, Motrin, Naproxen, Naprosyn and Aspirin  based products such as Excedrin, Goody's Powder, BC Powder. Stop ANY OVER THE COUNTER supplements until after surgery.  You may however, continue to take Tylenol  if needed for pain up until the day of surgery.  Stop metFORMIN  (GLUCOPHAGE ) 2 days prior to surgery-Last dose will be on 03-09-24 Friday  Stop 81 mg Aspirin  5 days prior to surgery as instructed by your surgeon-Last dose was on 02-28-24 Tuesday  Continue taking all of your other prescription medications up until the day of surgery.  ON THE DAY OF SURGERY ONLY TAKE THESE MEDICATIONS WITH SIPS OF WATER: -levETIRAcetam  (KEPPRA )  -levothyroxine  (SYNTHROID )   No Alcohol for 24 hours before or after surgery.  No Smoking including e-cigarettes for 24 hours before surgery.  No chewable tobacco products for at least 6 hours before surgery.  No nicotine patches on the day of surgery.  Do not use any recreational drugs for at least a week (preferably 2 weeks) before your surgery.  Please be advised that the combination of cocaine and anesthesia may have negative outcomes, up to and including  death. If you test positive for cocaine, your surgery will be cancelled.  On the morning of surgery brush your teeth with toothpaste and water, you may rinse your mouth with mouthwash if you wish. Do not swallow any toothpaste or mouthwash.  Use CHG Soap as directed on instruction sheet.  Do not wear jewelry, make-up, hairpins, clips or nail polish.  For welded (permanent) jewelry: bracelets, anklets, waist bands, etc.  Please have this removed prior to surgery.  If it is not removed, there is a chance that hospital personnel will need to cut it off on the day of surgery.  Do not wear lotions, powders, or perfumes.   Do not shave body hair from the neck down 48 hours before surgery.  Contact lenses, hearing aids and dentures may not be worn into surgery.  Do not bring valuables to the hospital. [facility] is not responsible for any missing/lost belongings or valuables.   Notify your doctor if there is any change in your medical condition (cold, fever, infection).  Wear comfortable clothing (specific to your surgery type) to the hospital.  After surgery, you can help prevent lung complications by doing breathing exercises.  Take deep breaths and cough every 1-2 hours. Your doctor may order a device called an Incentive Spirometer to help you take deep breaths. When coughing or sneezing, hold a pillow firmly against your incision with both hands. This is called splinting. Doing this helps protect your incision. It also decreases belly discomfort.  If you are being admitted to the hospital overnight, leave your suitcase in the car.  After surgery it may be brought to your room.  In case of increased patient census, it may be necessary for you, the patient, to continue your postoperative care in the Same Day Surgery department.  If you are being discharged the day of surgery, you will not be allowed to drive home. You will need a responsible individual to drive you home and stay with  you for 24 hours after surgery.   If you are taking public transportation, you will need to have a responsible individual with you.  Please call the Pre-admissions Testing Dept. at [phone] if you have any questions about these instructions.  Surgery Visitation Policy:  Patients having surgery or a procedure may have two visitors.  Children under the age of 94 must have an adult with them who is not the patient.                                                                                                             Preparing for Surgery with CHLORHEXIDINE  GLUCONATE (CHG) Soap  Chlorhexidine  Gluconate (CHG) Soap  o An antiseptic cleaner that kills germs and bonds with the skin to continue killing germs even after washing  o Used for showering the night before surgery and morning of surgery  Before surgery, you can play an important role by reducing the number of germs on your skin.  CHG (Chlorhexidine  gluconate) soap is an antiseptic cleanser which kills germs and bonds with the skin to continue killing germs even after washing.  Please do not use if you have an allergy to CHG or antibacterial soaps. If your skin becomes reddened/irritated stop using the CHG.  1. Shower the NIGHT BEFORE SURGERY with CHG soap.  2. If you choose to wash your hair, wash your hair first as usual with your normal shampoo.  3. After shampooing, rinse your hair and body thoroughly to remove the shampoo.  4. Use CHG as you would any other liquid soap. You can apply CHG directly to the skin and wash gently with a clean washcloth.  5. Apply the CHG soap to your body only from the neck down. Do not use on open wounds or open sores. Avoid contact with your eyes, ears, mouth, and genitals (private parts). Wash face and genitals (private parts) with your normal soap.  6. Wash thoroughly, paying special attention to the area where your surgery will be performed.  7. Thoroughly rinse your body with  warm water.  8. Do not shower/wash with your normal soap after using and rinsing off the CHG soap.  9. Do not use lotions, oils, etc., after showering with CHG.  10. Pat yourself dry with a clean towel.  11. Wear clean pajamas to bed the night before surgery.  12. Place clean sheets on your bed the night of your shower and do not sleep with pets.  13. Do not apply any deodorants/lotions/powders.  14. Please wear clean clothes to the hospital.  15. Remember to brush your teeth with your regular toothpaste.   [employer]  Directory to address health-related social needs:  [email or web address]

## 2024-03-01 ENCOUNTER — Encounter
Admission: RE | Admit: 2024-03-01 | Discharge: 2024-03-01 | Disposition: A | Discharge status: Home / Self Care | Source: Ambulatory Visit | Attending: General Surgery | Admitting: General Surgery

## 2024-03-01 DIAGNOSIS — Z01818 Encounter for other preprocedural examination: Secondary | ICD-10-CM | POA: Insufficient documentation

## 2024-03-01 DIAGNOSIS — Z8673 Personal history of transient ischemic attack (TIA), and cerebral infarction without residual deficits: Secondary | ICD-10-CM | POA: Diagnosis not present

## 2024-03-01 DIAGNOSIS — E1165 Type 2 diabetes mellitus with hyperglycemia: Secondary | ICD-10-CM | POA: Insufficient documentation

## 2024-03-01 DIAGNOSIS — R011 Cardiac murmur, unspecified: Secondary | ICD-10-CM | POA: Insufficient documentation

## 2024-03-01 DIAGNOSIS — Z01812 Encounter for preprocedural laboratory examination: Secondary | ICD-10-CM

## 2024-03-01 DIAGNOSIS — Z01810 Encounter for preprocedural cardiovascular examination: Secondary | ICD-10-CM

## 2024-03-01 LAB — CBC
HCT: 39.7 % (ref 36.0–46.0)
Hemoglobin: 13.5 g/dL (ref 12.0–15.0)
MCH: 30.6 pg (ref 26.0–34.0)
MCHC: 34 g/dL (ref 30.0–36.0)
MCV: 90 fL (ref 80.0–100.0)
Platelets: 302 K/uL (ref 150–400)
RBC: 4.41 MIL/uL (ref 3.87–5.11)
RDW: 13.8 % (ref 11.5–15.5)
WBC: 9.3 K/uL (ref 4.0–10.5)
nRBC: 0 % (ref 0.0–0.2)

## 2024-03-01 LAB — BASIC METABOLIC PANEL WITH GFR
Anion gap: 12 (ref 5–15)
BUN: 10 mg/dL (ref 6–20)
CO2: 24 mmol/L (ref 22–32)
Calcium: 9.1 mg/dL (ref 8.9–10.3)
Chloride: 100 mmol/L (ref 98–111)
Creatinine, Ser: 0.76 mg/dL (ref 0.44–1.00)
GFR, Estimated: >60 mL/min
Glucose, Bld: 109 mg/dL — ABNORMAL HIGH (ref 70–99)
Potassium: 3.8 mmol/L (ref 3.5–5.1)
Sodium: 136 mmol/L (ref 135–145)

## 2024-03-07 ENCOUNTER — Ambulatory Visit: Admission: RE | Admit: 2024-03-07 | Source: Home / Self Care | Admitting: General Surgery

## 2024-03-07 ENCOUNTER — Encounter: Admission: RE | Payer: Self-pay | Source: Home / Self Care

## 2024-03-19 ENCOUNTER — Ambulatory Visit: Admission: RE | Admit: 2024-03-19 | Admitting: General Surgery

## 2024-03-19 ENCOUNTER — Encounter: Admission: RE | Payer: Self-pay
